# Patient Record
Sex: Female | Born: 1940
Health system: Southern US, Community
[De-identification: ages and names within clinical notes are randomized; demographics above are authoritative.]

## PROBLEM LIST (undated history)

## (undated) DIAGNOSIS — Z9889 Other specified postprocedural states: Secondary | ICD-10-CM

## (undated) DIAGNOSIS — E7800 Pure hypercholesterolemia, unspecified: Secondary | ICD-10-CM

## (undated) DIAGNOSIS — C801 Malignant (primary) neoplasm, unspecified: Secondary | ICD-10-CM

## (undated) DIAGNOSIS — I10 Essential (primary) hypertension: Secondary | ICD-10-CM

## (undated) DIAGNOSIS — R7303 Prediabetes: Secondary | ICD-10-CM

## (undated) DIAGNOSIS — K589 Irritable bowel syndrome without diarrhea: Secondary | ICD-10-CM

## (undated) DIAGNOSIS — R112 Nausea with vomiting, unspecified: Secondary | ICD-10-CM

## (undated) DIAGNOSIS — K219 Gastro-esophageal reflux disease without esophagitis: Secondary | ICD-10-CM

## (undated) DIAGNOSIS — R011 Cardiac murmur, unspecified: Secondary | ICD-10-CM

## (undated) HISTORY — PX: BREAST SURGERY: SHX581

## (undated) HISTORY — PX: TOTAL ABDOMINAL HYSTERECTOMY: SHX209

## (undated) HISTORY — DX: Irritable bowel syndrome, unspecified: K58.9

## (undated) HISTORY — PX: CHOLECYSTECTOMY: SHX55

---
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
## 1999-02-02 ENCOUNTER — Other Ambulatory Visit: Admission: RE | Admit: 1999-02-02 | Discharge: 1999-02-02 | Payer: Self-pay | Admitting: Obstetrics and Gynecology

## 2000-02-06 ENCOUNTER — Other Ambulatory Visit: Admission: RE | Admit: 2000-02-06 | Discharge: 2000-02-06 | Payer: Self-pay | Admitting: Obstetrics and Gynecology

## 2001-02-11 ENCOUNTER — Other Ambulatory Visit: Admission: RE | Admit: 2001-02-11 | Discharge: 2001-02-11 | Payer: Self-pay | Admitting: Obstetrics and Gynecology

## 2005-08-25 ENCOUNTER — Encounter: Admission: RE | Admit: 2005-08-25 | Discharge: 2005-08-25 | Payer: Self-pay | Admitting: General Surgery

## 2005-08-25 ENCOUNTER — Encounter (INDEPENDENT_AMBULATORY_CARE_PROVIDER_SITE_OTHER): Payer: Self-pay | Admitting: *Deleted

## 2006-03-05 ENCOUNTER — Encounter: Admission: RE | Admit: 2006-03-05 | Discharge: 2006-03-05 | Payer: Self-pay | Admitting: Internal Medicine

## 2006-09-04 ENCOUNTER — Encounter: Admission: RE | Admit: 2006-09-04 | Discharge: 2006-09-04 | Payer: Self-pay | Admitting: Internal Medicine

## 2007-04-15 ENCOUNTER — Encounter: Admission: RE | Admit: 2007-04-15 | Discharge: 2007-04-15 | Payer: Self-pay | Admitting: Internal Medicine

## 2009-09-13 ENCOUNTER — Encounter: Admission: RE | Admit: 2009-09-13 | Discharge: 2009-09-13 | Payer: Self-pay | Admitting: Internal Medicine

## 2009-12-03 ENCOUNTER — Encounter: Admission: RE | Admit: 2009-12-03 | Discharge: 2009-12-03 | Payer: Self-pay | Admitting: Internal Medicine

## 2010-09-14 ENCOUNTER — Encounter: Admission: RE | Admit: 2010-09-14 | Discharge: 2010-09-14 | Payer: Self-pay | Admitting: Internal Medicine

## 2011-01-15 ENCOUNTER — Encounter: Payer: Self-pay | Admitting: Internal Medicine

## 2011-08-01 ENCOUNTER — Other Ambulatory Visit: Payer: Self-pay | Admitting: Internal Medicine

## 2011-08-01 DIAGNOSIS — Z1231 Encounter for screening mammogram for malignant neoplasm of breast: Secondary | ICD-10-CM

## 2011-09-18 ENCOUNTER — Ambulatory Visit
Admission: RE | Admit: 2011-09-18 | Discharge: 2011-09-18 | Disposition: A | Discharge status: Home / Self Care | Payer: Medicare Other | Source: Ambulatory Visit | Attending: Internal Medicine | Admitting: Internal Medicine

## 2011-09-18 DIAGNOSIS — Z1231 Encounter for screening mammogram for malignant neoplasm of breast: Secondary | ICD-10-CM

## 2012-02-07 ENCOUNTER — Other Ambulatory Visit: Payer: Self-pay | Admitting: Gastroenterology

## 2012-08-09 ENCOUNTER — Other Ambulatory Visit: Payer: Self-pay | Admitting: Internal Medicine

## 2012-08-09 DIAGNOSIS — Z1231 Encounter for screening mammogram for malignant neoplasm of breast: Secondary | ICD-10-CM

## 2012-08-17 ENCOUNTER — Observation Stay (HOSPITAL_COMMUNITY)
Admission: EM | Admit: 2012-08-17 | Discharge: 2012-08-18 | Disposition: A | Discharge status: Home / Self Care | Payer: Medicare Other | Attending: Emergency Medicine | Admitting: Emergency Medicine

## 2012-08-17 ENCOUNTER — Encounter (HOSPITAL_COMMUNITY): Payer: Self-pay | Admitting: *Deleted

## 2012-08-17 DIAGNOSIS — G454 Transient global amnesia: Principal | ICD-10-CM | POA: Insufficient documentation

## 2012-08-17 DIAGNOSIS — R413 Other amnesia: Secondary | ICD-10-CM | POA: Insufficient documentation

## 2012-08-17 DIAGNOSIS — F29 Unspecified psychosis not due to a substance or known physiological condition: Secondary | ICD-10-CM | POA: Insufficient documentation

## 2012-08-17 DIAGNOSIS — I10 Essential (primary) hypertension: Secondary | ICD-10-CM | POA: Insufficient documentation

## 2012-08-17 HISTORY — DX: Essential (primary) hypertension: I10

## 2012-08-17 HISTORY — DX: Pure hypercholesterolemia, unspecified: E78.00

## 2012-08-17 LAB — CBC WITH DIFFERENTIAL/PLATELET
Eosinophils Absolute: 0.1 10*3/uL (ref 0.0–0.7)
Hemoglobin: 13.7 g/dL (ref 12.0–15.0)
MCH: 30.1 pg (ref 26.0–34.0)
MCHC: 34.1 g/dL (ref 30.0–36.0)
MCV: 88.4 fL (ref 78.0–100.0)
Monocytes Absolute: 0.4 10*3/uL (ref 0.1–1.0)
Monocytes Relative: 7 % (ref 3–12)
RBC: 4.55 MIL/uL (ref 3.87–5.11)
WBC: 5.8 10*3/uL (ref 4.0–10.5)

## 2012-08-17 LAB — POCT I-STAT TROPONIN I

## 2012-08-17 LAB — POCT I-STAT, CHEM 8
Calcium, Ion: 1.27 mmol/L (ref 1.13–1.30)
Creatinine, Ser: 0.7 mg/dL (ref 0.50–1.10)
HCT: 41 % (ref 36.0–46.0)
Hemoglobin: 13.9 g/dL (ref 12.0–15.0)
Potassium: 4.3 mEq/L (ref 3.5–5.1)

## 2012-08-17 NOTE — ED Notes (Signed)
Pt husband states about an hour and a half ago she had a moment where she seemed to forget everything. Pt also had a increase in blood pressure at home. Pt husband states that pt seems back to normal now and pt alert and oriented x 3. Pt states that she does not remember the incident. Pt able to move all extremities and follow commands.

## 2012-08-18 ENCOUNTER — Emergency Department (HOSPITAL_COMMUNITY): Payer: Medicare Other

## 2012-08-18 ENCOUNTER — Observation Stay (HOSPITAL_COMMUNITY): Payer: Medicare Other

## 2012-08-18 DIAGNOSIS — I517 Cardiomegaly: Secondary | ICD-10-CM

## 2012-08-18 DIAGNOSIS — G459 Transient cerebral ischemic attack, unspecified: Secondary | ICD-10-CM

## 2012-08-18 LAB — COMPREHENSIVE METABOLIC PANEL
ALT: 29 U/L (ref 0–35)
Alkaline Phosphatase: 111 U/L (ref 39–117)
CO2: 26 mEq/L (ref 19–32)
Chloride: 103 mEq/L (ref 96–112)
GFR calc Af Amer: >90 mL/min (ref >90)
GFR calc non Af Amer: >90 mL/min (ref >90)
Glucose, Bld: 129 mg/dL — ABNORMAL HIGH (ref 70–99)
Potassium: 4.1 mEq/L (ref 3.5–5.1)
Sodium: 139 mEq/L (ref 135–145)
Total Bilirubin: 0.3 mg/dL (ref 0.3–1.2)

## 2012-08-18 LAB — LIPID PANEL
HDL: 56 mg/dL (ref >39)
Total CHOL/HDL Ratio: 2.9 RATIO
VLDL: 20 mg/dL (ref 0–40)

## 2012-08-18 LAB — URINALYSIS, ROUTINE W REFLEX MICROSCOPIC
Bilirubin Urine: NEGATIVE
Nitrite: NEGATIVE
Specific Gravity, Urine: 1.01 (ref 1.005–1.030)
Urobilinogen, UA: 0.2 mg/dL (ref 0.0–1.0)
pH: 7 (ref 5.0–8.0)

## 2012-08-18 LAB — HEMOGLOBIN A1C
Hgb A1c MFr Bld: 6 % — ABNORMAL HIGH (ref <5.7)
Mean Plasma Glucose: 126 mg/dL — ABNORMAL HIGH (ref <117)

## 2012-08-18 LAB — URINE MICROSCOPIC-ADD ON

## 2012-08-18 MED ORDER — ASPIRIN 325 MG PO TABS
325.0000 mg | ORAL_TABLET | Freq: Every day | ORAL | Status: DC
  Administered 2012-08-18: 325 mg via ORAL
  Filled 2012-08-18 (×2): qty 1

## 2012-08-18 MED ORDER — SODIUM CHLORIDE 0.9 % IV SOLN
INTRAVENOUS | Status: DC
  Administered 2012-08-18: 02:00:00 via INTRAVENOUS

## 2012-08-18 MED ORDER — ACETAMINOPHEN 325 MG PO TABS
650.0000 mg | ORAL_TABLET | ORAL | Status: DC | PRN
  Administered 2012-08-18 (×2): 650 mg via ORAL
  Filled 2012-08-18 (×2): qty 2

## 2012-08-18 MED ORDER — ONDANSETRON HCL 4 MG/2ML IJ SOLN
4.0000 mg | INTRAMUSCULAR | Status: DC | PRN
  Administered 2012-08-18: 4 mg via INTRAVENOUS
  Filled 2012-08-18: qty 2

## 2012-08-18 MED ORDER — AMLODIPINE BESYLATE 2.5 MG PO TABS
2.5000 mg | ORAL_TABLET | Freq: Every day | ORAL | Status: DC
  Administered 2012-08-18: 2.5 mg via ORAL
  Filled 2012-08-18: qty 1

## 2012-08-18 NOTE — ED Provider Notes (Signed)
History     CSN: [id number]  Arrival date & time 08/17/12  2109   First MD Initiated Contact with Patient 08/17/12 2312      Chief Complaint  Patient presents with  . Hypertension    HPI Pt was seen at 2315.  Per pt and her husband, c/o sudden onset and resolution of one episode of confusion and AMS that began approx 1900 PTA.  Pt's husband describes the episode of "asking questions about things she already knew about" that lasted approx before resolving.  He states that he took pt's BP and it was "200's/100's."  Pt does not recall events PTA, stating she "just remembers watching [employer]."  Denies syncope, no dysarthria, no facial droop, no focal motor weakness, no tingling/numbness in extremities, no ataxia, no N/V/D, no CP/SOB, no abd pain, no fevers.    Past Medical History  Diagnosis Date  . Hypertension   . High cholesterol     History reviewed. No pertinent past surgical history.   History  Substance Use Topics  . Smoking status: Never Smoker   . Smokeless tobacco: Not on file  . Alcohol Use: No    Review of Systems ROS: Statement: All systems negative except as marked or noted in the HPI; Constitutional: Negative for fever and chills. ; ; Eyes: Negative for eye pain, redness and discharge. ; ; ENMT: Negative for ear pain, hoarseness, nasal congestion, sinus pressure and sore throat. ; ; Cardiovascular: Negative for chest pain, palpitations, diaphoresis, dyspnea and peripheral edema. ; ; Respiratory: Negative for cough, wheezing and stridor. ; ; Gastrointestinal: Negative for nausea, vomiting, diarrhea, abdominal pain, blood in stool, hematemesis, jaundice and rectal bleeding. . ; ; Genitourinary: Negative for dysuria, flank pain and hematuria. ; ; Musculoskeletal: Negative for back pain and neck pain. Negative for swelling and trauma.; ; Skin: Negative for pruritus, rash, abrasions, blisters, bruising and skin lesion.; ; Neuro: +AMS. Negative for headache,  lightheadedness and neck stiffness. Negative for weakness, altered level of consciousness , extremity weakness, paresthesias, involuntary movement, seizure and syncope.     Allergies  Codeine  Home Medications   Current Outpatient Rx  Name Route Sig Dispense Refill  . ASPIRIN EC 81 MG PO TBEC Oral Take 81 mg by mouth daily.    Marland Kitchen VITAMIN D PO Oral Take 1 tablet by mouth daily. 500iu    . CLONAZEPAM 0.5 MG PO TABS Oral Take 0.25-0.5 mg by mouth at bedtime as needed. sleep    . IBUPROFEN 200 MG PO TABS Oral Take 200-600 mg by mouth every 6 (six) hours as needed. Headache or restless leg pain    . PRAVASTATIN SODIUM 40 MG PO TABS Oral Take 40 mg by mouth at bedtime.      BP 185/64  Pulse 73  Temp 98.3 F (36.8 C) (Oral)  Resp 16  SpO2 96%  Physical Exam 2315: Physical examination:  Nursing notes reviewed; Vital signs and O2 SAT reviewed;  Constitutional: Well developed, Well nourished, Well hydrated, In no acute distress; Head:  Normocephalic, atraumatic; Eyes: EOMI, PERRL, No scleral icterus; ENMT: Mouth and pharynx normal, Mucous membranes moist; Neck: Supple, Full range of motion, No lymphadenopathy; Cardiovascular: Regular rate and rhythm, No gallop; Respiratory: Breath sounds clear & equal bilaterally, No wheezes.  Speaking full sentences with ease, Normal respiratory effort/excursion; Chest: Nontender, Movement normal; Abdomen: Soft, Nontender, Nondistended, Normal bowel sounds;; Extremities: Pulses normal, No tenderness, No edema, No calf edema or asymmetry.; Neuro: AA&Ox3, Major CN grossly intact.  Strength 5/5 equal bilat UE's and LE's.  DTR 2/4 equal bilat UE's and LE's.  No gross sensory deficits.  Normal cerebellar testing bilat UE's (finger-nose) and LE's (heel-shin).  Speech clear.  No facial droop.  No nystagmus.;.; Skin: Color normal, Warm, Dry.   ED Course  Procedures   MDM  MDM Reviewed: nursing note, vitals and previous chart Interpretation: ECG, labs, x-ray and CT  scan    Date: 08/18/2012  Rate: 67  Rhythm: normal sinus rhythm  QRS Axis: normal  Intervals: normal  ST/T Wave abnormalities: normal  Conduction Disutrbances:nonspecific intraventricular conduction delay  Narrative Interpretation:   Old EKG Reviewed: none available.   Results for orders placed during the hospital encounter of 08/17/12  CBC WITH DIFFERENTIAL      Component Value Range   WBC 5.8  4.0 - 10.5 K/uL   RBC 4.55  3.87 - 5.11 MIL/uL   Hemoglobin 13.7  12.0 - 15.0 g/dL   HCT 56.2  13.0 - 86.5 %   MCV 88.4  78.0 - 100.0 fL   MCH 30.1  26.0 - 34.0 pg   MCHC 34.1  30.0 - 36.0 g/dL   RDW 78.4  69.6 - 29.5 %   Platelets 201  150 - 400 K/uL   Neutrophils Relative 61  43 - 77 %   Neutro Abs 3.5  1.7 - 7.7 K/uL   Lymphocytes Relative 31  12 - 46 %   Lymphs Abs 1.8  0.7 - 4.0 K/uL   Monocytes Relative 7  3 - 12 %   Monocytes Absolute 0.4  0.1 - 1.0 K/uL   Eosinophils Relative 1  0 - 5 %   Eosinophils Absolute 0.1  0.0 - 0.7 K/uL   Basophils Relative 1  0 - 1 %   Basophils Absolute 0.0  0.0 - 0.1 K/uL  POCT I-STAT, CHEM 8      Component Value Range   Sodium 141  135 - 145 mEq/L   Potassium 4.3  3.5 - 5.1 mEq/L   Chloride 104  96 - 112 mEq/L   BUN 12  6 - 23 mg/dL   Creatinine, Ser 2.84  0.50 - 1.10 mg/dL   Glucose, Bld 132 (*) 70 - 99 mg/dL   Calcium, Ion 4.40  1.02 - 1.30 mmol/L   TCO2 26  0 - 100 mmol/L   Hemoglobin 13.9  12.0 - 15.0 g/dL   HCT 72.5  36.6 - 44.0 %  POCT I-STAT TROPONIN I      Component Value Range   Troponin i, poc 0.00  0.00 - 0.08 ng/mL   Comment 3           URINALYSIS, ROUTINE W REFLEX MICROSCOPIC      Component Value Range   Color, Urine YELLOW  YELLOW   APPearance CLEAR  CLEAR   Specific Gravity, Urine 1.010  1.005 - 1.030   pH 7.0  5.0 - 8.0   Glucose, UA NEGATIVE  NEGATIVE mg/dL   Hgb urine dipstick NEGATIVE  NEGATIVE   Bilirubin Urine NEGATIVE  NEGATIVE   Ketones, ur NEGATIVE  NEGATIVE mg/dL   Protein, ur NEGATIVE  NEGATIVE mg/dL     Urobilinogen, UA 0.2  0.0 - 1.0 mg/dL   Nitrite NEGATIVE  NEGATIVE   Leukocytes, UA MODERATE (*) NEGATIVE  URINE MICROSCOPIC-ADD ON      Component Value Range   Squamous Epithelial / LPF FEW (*) RARE   WBC, UA 3-6  <3 WBC/hpf  RBC / HPF 0-2  <3 RBC/hpf   Bacteria, UA RARE  RARE   Dg Chest 2 View 08/18/2012  *RADIOLOGY REPORT*  Clinical Data: 71 year old female with episode of confusion. Tachycardia, palpitations.  CHEST - 2 VIEW  Comparison: None.  Findings: Cardiac size at the upper limits of normal. Other mediastinal contours are within normal limits.  Visualized tracheal air column is within normal limits.  Mild increased interstitial markings diffusely.  No pneumothorax, pulmonary edema, pleural effusion or confluent pulmonary opacity. No acute osseous abnormality identified.  IMPRESSION: No acute cardiopulmonary abnormality.   Original Report Authenticated By: Harley Hallmark, M.D.    Ct Head Wo Contrast 08/18/2012  *RADIOLOGY REPORT*  Clinical Data: 71 year old female with confusion and altered mental status.  CT HEAD WITHOUT CONTRAST  Technique:  Contiguous axial images were obtained from the base of the skull through the vertex without contrast.  Comparison: None.  Findings: Visualized paranasal sinuses and mastoids are clear.  No acute osseous abnormality identified.  Visualized orbits and scalp soft tissues are within normal limits.  Cerebral volume is within normal limits for age.  No midline shift, ventriculomegaly, mass effect, evidence of mass lesion, intracranial hemorrhage or evidence of cortically based acute infarction.  Gray-white matter differentiation is within normal limits throughout the brain.  No suspicious intracranial vascular hyperdensity.  IMPRESSION: Normal for age noncontrast CT appearance of the brain.   Original Report Authenticated By: Harley Hallmark, M.D.       Mikkel.Chamorro:  Pt continues per her baseline.  Neuro exam unchanged.  Pt has ambulated with steady gait, easy  resps.  T/C to Dr. Timothy Lasso (on call for Dr. Wylene Simmer), case discussed, including:  HPI, pertinent PM/SHx, VS/PE, dx testing, ED course and treatment:  requests to place pt in CDU TIA Observation protocol, he will see pt in the morning.  Dx testing d/w pt and family.  Questions answered.  Verb understanding, agreeable to CDU Obs protocol.         Laray Anger, DO 08/18/12 1916

## 2012-08-18 NOTE — ED Notes (Signed)
Pt is in MRI  

## 2012-08-18 NOTE — Progress Notes (Signed)
Subjective: 71 F presented last night with 30 minutes of confusion/Amnesia - ? TIA Pt husband states about an hour and a half prior to presentation she had a 30 min event where she seemed to forget everything and was confused. Pt also had a increase in blood pressure.  She is  back to normal now and pt alert and oriented x 3. Pt states that she does not remember the incident. Pt able to move all extremities and follow commands. She was put on CDU TIA protocol and all studies thus far are (-).   Objective: Vital signs in last 24 hours: Temp:  [98.2 F (36.8 C)-98.4 F (36.9 C)] 98.2 F (36.8 C) (08/25 0141) Pulse Rate:  [64-73] 72  (08/25 0620) Resp:  [14-18] 14  (08/25 0620) BP: (123-187)/(48-92) 130/52 mmHg (08/25 0620) SpO2:  [96 %-98 %] 98 % (08/25 0620) Weight change:     CBG (last 3)  No results found for this basename: GLUCAP:3 in the last 72 hours  Intake/Output from previous day: No intake or output data in the 24 hours ending 08/18/12 0918     Physical Exam  General appearance: Alert and oriented.  No distress Eyes: no scleral icterus Throat: oropharynx moist without erythema Resp: CTA Cardio: Reg GI: soft, non-tender; bowel sounds normal; no masses,  no organomegaly Extremities: no clubbing, cyanosis or edema No Neuro issues. Memory back to nml.  No Weakness, numbness or issues.   Lab Results:  [facility] 08/18/12 0122 08/17/12 2138  NA 139 141  K 4.1 4.3  CL 103 104  CO2 26 --  GLUCOSE 129* 147*  BUN 9 12  CREATININE 0.55 0.70  CALCIUM 10.2 --  MG -- --  PHOS -- --     Basename 08/18/12 0122  AST 24  ALT 29  ALKPHOS 111  BILITOT 0.3  PROT 7.6  ALBUMIN 4.3     Basename 08/17/12 2138 08/17/12 2123  WBC -- 5.8  NEUTROABS -- 3.5  HGB 13.9 13.7  HCT 41.0 40.2  MCV -- 88.4  PLT -- 201    Lab Results  Component Value Date   INR 0.95 08/18/2012    No results found for this basename: CKTOTAL:3,CKMB:3,CKMBINDEX:3,TROPONINI:3 in the last 72  hours  No results found for this basename: TSH,T4TOTAL,FREET3,T3FREE,THYROIDAB in the last 72 hours  No results found for this basename: VITAMINB12:2,FOLATE:2,FERRITIN:2,TIBC:2,IRON:2,RETICCTPCT:2 in the last 72 hours  Micro Results: No results found for this or any previous visit (from the past 240 hour(s)).   Studies/Results: Dg Chest 2 View  08/18/2012  *RADIOLOGY REPORT*  Clinical Data: 71 year old female with episode of confusion. Tachycardia, palpitations.  CHEST - 2 VIEW  Comparison: None.  Findings: Cardiac size at the upper limits of normal. Other mediastinal contours are within normal limits.  Visualized tracheal air column is within normal limits.  Mild increased interstitial markings diffusely.  No pneumothorax, pulmonary edema, pleural effusion or confluent pulmonary opacity. No acute osseous abnormality identified.  IMPRESSION: No acute cardiopulmonary abnormality.   Original Report Authenticated By: Harley Hallmark, M.D.    Ct Head Wo Contrast  08/18/2012  *RADIOLOGY REPORT*  Clinical Data: 71 year old female with confusion and altered mental status.  CT HEAD WITHOUT CONTRAST  Technique:  Contiguous axial images were obtained from the base of the skull through the vertex without contrast.  Comparison: None.  Findings: Visualized paranasal sinuses and mastoids are clear.  No acute osseous abnormality identified.  Visualized orbits and scalp soft tissues are within normal limits.  Cerebral volume is within normal limits for age.  No midline shift, ventriculomegaly, mass effect, evidence of mass lesion, intracranial hemorrhage or evidence of cortically based acute infarction.  Gray-white matter differentiation is within normal limits throughout the brain.  No suspicious intracranial vascular hyperdensity.  IMPRESSION: Normal for age noncontrast CT appearance of the brain.   Original Report Authenticated By: Harley Hallmark, M.D.    Mr Brain Wo Contrast  08/18/2012  *RADIOLOGY REPORT*   Clinical Data:  Confusion.  Lapse of memory.  Suspected acute but ill-defined cerebrovascular disease.  MRI HEAD WITHOUT CONTRAST MRA HEAD WITHOUT CONTRAST  Technique:  Multiplanar, multiecho pulse sequences of the brain and surrounding structures were obtained without intravenous contrast. Angiographic images of the head were obtained using MRA technique without contrast.  Comparison:  CT head earlier in the day.  MRI HEAD  Findings:  There is no evidence for acute infarction, intracranial hemorrhage, mass lesion, hydrocephalus, or extra-axial fluid.  Mild age related atrophy.  Slight chronic microvascular ischemic change in the periventricular and subcortical white matter.  No foci of chronic hemorrhage.  No midline shift.  Major intracranial vascular structures patent.  Normal pituitary and cerebellar tonsils. Calvarium and skull base intact.  Mild chronic sinus disease.  No acute mastoid fluid. No change from prior recent CT.  IMPRESSION: No acute or focal intracranial abnormality.  Mild age related changes.  MRA HEAD  Findings: The internal carotid arteries are widely patent.  The basilar artery is widely patent with right vertebral dominant. There is no intracranial stenosis or aneurysm.  IMPRESSION: Negative exam.   Original Report Authenticated By: Elsie Stain, M.D.    Mr Mra Head/brain Wo Cm  08/18/2012  *RADIOLOGY REPORT*  Clinical Data:  Confusion.  Lapse of memory.  Suspected acute but ill-defined cerebrovascular disease.  MRI HEAD WITHOUT CONTRAST MRA HEAD WITHOUT CONTRAST  Technique:  Multiplanar, multiecho pulse sequences of the brain and surrounding structures were obtained without intravenous contrast. Angiographic images of the head were obtained using MRA technique without contrast.  Comparison:  CT head earlier in the day.  MRI HEAD  Findings:  There is no evidence for acute infarction, intracranial hemorrhage, mass lesion, hydrocephalus, or extra-axial fluid.  Mild age related atrophy.   Slight chronic microvascular ischemic change in the periventricular and subcortical white matter.  No foci of chronic hemorrhage.  No midline shift.  Major intracranial vascular structures patent.  Normal pituitary and cerebellar tonsils. Calvarium and skull base intact.  Mild chronic sinus disease.  No acute mastoid fluid. No change from prior recent CT.  IMPRESSION: No acute or focal intracranial abnormality.  Mild age related changes.  MRA HEAD  Findings: The internal carotid arteries are widely patent.  The basilar artery is widely patent with right vertebral dominant. There is no intracranial stenosis or aneurysm.  IMPRESSION: Negative exam.   Original Report Authenticated By: Elsie Stain, M.D.      Medications: Scheduled:   . aspirin  325 mg Oral Daily   Continuous:   . sodium chloride 75 mL/hr at 08/18/12 0140     Assessment/Plan: Active Problems:  * No active hospital problems. *   TIA? -  With Transient Global Amnesia type Sxs.  CCT (-), MRI/A (-).  No CVA Sxs at this time.  If ECHO (-) she can go home.  OK to eat Breakfast and ordered. HTN - with very high BPs last night - Better and now worse.  Start Amlodipine 2.5 per day -  first now - I wrote Rx and left it with her.  Follow up in our office this week. Lipids. Hyperglycemia - eval for DM2 as outpt. D/C on ASA daily.    LOS: 1 day   Emalea Mix M 08/18/2012, 9:18 AM

## 2012-08-18 NOTE — Progress Notes (Signed)
VASCULAR LAB PRELIMINARY  PRELIMINARY  PRELIMINARY  PRELIMINARY  Carotid Dopplers completed.    Preliminary report:  No ICA stenosis.  Vertebral artery flow is antegrade.  Dann Galicia, 08/18/2012, 8:47 AM

## 2012-08-18 NOTE — ED Provider Notes (Signed)
Seen by PCP in ED, echo results pnd but Pt/family comfortable with discharge, Pt asymptomatic, still amnestic for event.  Hurman Horn, MD 08/22/12 [phone]

## 2012-08-18 NOTE — ED Notes (Signed)
KIM STEVENS ( DAUGHTER)  - PHONE NO. [phone].

## 2012-08-18 NOTE — Progress Notes (Signed)
  Echocardiogram 2D Echocardiogram has been performed.  Laura [name] FRANCES 08/18/2012, 9:20 AM

## 2012-08-18 NOTE — ED Notes (Signed)
Patient returned from mri, on stretcher, ambulated to restroom and placed back on cardiac monitor.

## 2012-08-22 LAB — URINE CULTURE

## 2012-08-23 NOTE — ED Notes (Signed)
+   Urine Chart sent to EDP office for review. 

## 2012-09-18 ENCOUNTER — Ambulatory Visit
Admission: RE | Admit: 2012-09-18 | Discharge: 2012-09-18 | Disposition: A | Discharge status: Home / Self Care | Payer: Medicare Other | Source: Ambulatory Visit | Attending: Internal Medicine | Admitting: Internal Medicine

## 2012-09-18 DIAGNOSIS — Z1231 Encounter for screening mammogram for malignant neoplasm of breast: Secondary | ICD-10-CM

## 2013-07-28 ENCOUNTER — Other Ambulatory Visit: Payer: Self-pay

## 2013-07-28 DIAGNOSIS — Z1231 Encounter for screening mammogram for malignant neoplasm of breast: Secondary | ICD-10-CM

## 2013-09-22 ENCOUNTER — Ambulatory Visit
Admission: RE | Admit: 2013-09-22 | Discharge: 2013-09-22 | Disposition: A | Discharge status: Home / Self Care | Payer: Medicare Other | Source: Ambulatory Visit

## 2013-09-22 DIAGNOSIS — Z1231 Encounter for screening mammogram for malignant neoplasm of breast: Secondary | ICD-10-CM

## 2014-07-15 ENCOUNTER — Other Ambulatory Visit: Payer: Self-pay

## 2014-07-15 DIAGNOSIS — Z1231 Encounter for screening mammogram for malignant neoplasm of breast: Secondary | ICD-10-CM

## 2014-07-27 ENCOUNTER — Encounter: Payer: Self-pay | Admitting: Cardiology

## 2014-07-27 ENCOUNTER — Ambulatory Visit (INDEPENDENT_AMBULATORY_CARE_PROVIDER_SITE_OTHER): Payer: Medicare Other | Admitting: Cardiology

## 2014-07-27 VITALS — BP 158/70 | HR 73 | Ht 65.5 in | Wt 160.7 lb

## 2014-07-27 DIAGNOSIS — R5381 Other malaise: Secondary | ICD-10-CM

## 2014-07-27 DIAGNOSIS — R002 Palpitations: Secondary | ICD-10-CM

## 2014-07-27 DIAGNOSIS — R5383 Other fatigue: Principal | ICD-10-CM

## 2014-07-27 LAB — BASIC METABOLIC PANEL
BUN: 15 mg/dL (ref 6–23)
CALCIUM: 9.8 mg/dL (ref 8.4–10.5)
CO2: 28 mEq/L (ref 19–32)
Chloride: 104 mEq/L (ref 96–112)
Creat: 0.72 mg/dL (ref 0.50–1.10)
Glucose, Bld: 110 mg/dL — ABNORMAL HIGH (ref 70–99)
POTASSIUM: 4 meq/L (ref 3.5–5.3)
SODIUM: 142 meq/L (ref 135–145)

## 2014-07-27 LAB — MAGNESIUM: MAGNESIUM: 2.1 mg/dL (ref 1.5–2.5)

## 2014-07-27 NOTE — Progress Notes (Signed)
HPI The patient has no prior cardiac history. I did note an echocardiogram in 2013 for evaluation of neurologic complaints. This was essentially unremarkable and I reviewed these results. She is here for evaluation of palpitations. These been going on for some time. However, she had an episode that woke her from her sleep which is why she was referred. She thinks it was going fast. She can't describe it as irregular. She says it may last for a few minutes. It goes away spontaneously. She hasn't had any presyncope or syncope with it. She thinks it is happening a little more frequently when she takes a nap or at night when she sleeps. It does not happen during the day. She denies any chest pressure, neck or arm discomfort. She denies any shortness of breath, PND or orthopnea. She's had no weight gain or edema. She does drink a lot of iced tea.  Allergies  Allergen Reactions  . Pneumococcal Vaccines Swelling and Other (See Comments)    Fever  . Codeine Other (See Comments)    Looses balance and feels light headed    Current Outpatient Prescriptions  Medication Sig Dispense Refill  . amLODipine (NORVASC) 5 MG tablet Take 1 tablet by mouth daily.      Marland Kitchen aspirin EC 81 MG tablet Take 81 mg by mouth daily.      . Cholecalciferol (VITAMIN D PO) Take 1 tablet by mouth daily. 500iu      . clonazePAM (KLONOPIN) 0.5 MG tablet Take 0.25-0.5 mg by mouth 2 (two) times daily as needed. sleep      . doxazosin (CARDURA) 1 MG tablet Take 1 tablet by mouth at bedtime.      . fexofenadine (ALLEGRA ALLERGY) 180 MG tablet Take 180 mg by mouth daily.      Marland Kitchen ibuprofen (ADVIL,MOTRIN) 200 MG tablet Take 200-600 mg by mouth every 6 (six) hours as needed. Headache or restless leg pain      . pravastatin (PRAVACHOL) 40 MG tablet Take 40 mg by mouth at bedtime.       No current facility-administered medications for this visit.    Past Medical History  Diagnosis Date  . Hypertension   . High cholesterol     No  past surgical history on file.  No family history on file.  History   Social History  . Marital Status: Married    Spouse Name: N/A    Number of Children: N/A  . Years of Education: N/A   Occupational History  . Not on file.   Social History Main Topics  . Smoking status: Never Smoker   . Smokeless tobacco: Not on file  . Alcohol Use: No  . Drug Use: No  . Sexual Activity:    Other Topics Concern  . Not on file   Social History Narrative  . No narrative on file    ROS:  As stated in the HPI and negative for all other systems.   PHYSICAL EXAM BP 158/70  Pulse 73  Ht 5' 5.5" (1.664 m)  Wt 160 lb 11.2 oz (72.893 kg)  BMI 26.33 kg/m2 GENERAL:  Well appearing HEENT:  Pupils equal round and reactive, fundi not visualized, oral mucosa unremarkable NECK:  No jugular venous distention, waveform within normal limits, carotid upstroke brisk and symmetric, no bruits, no thyromegaly LYMPHATICS:  No cervical, inguinal adenopathy LUNGS:  Clear to auscultation bilaterally BACK:  No CVA tenderness CHEST:  Unremarkable HEART:  PMI not displaced or sustained,S1 and S2  within normal limits, no S3, no S4, no clicks, no rubs, soft apical and upper sternal border 3 systolic murmur, no diastolic murmurs ABD:  Flat, positive bowel sounds normal in frequency in pitch, no bruits, no rebound, no guarding, no midline pulsatile mass, no hepatomegaly, no splenomegaly EXT:  2 plus pulses throughout, no edema, no cyanosis no clubbing SKIN:  No rashes no nodules NEURO:  Cranial nerves II through XII grossly intact, motor grossly intact throughout [place]:  Cognitively intact, oriented to person place and time   EKG:  Sinus rhythm, rate this 73, incomplete right bundle branch block, no acute ST-T wave changes  07/27/2014  ASSESSMENT AND PLAN  PALPITATIONS:  I will place a 48 hour Holter monitor. I don't strongly suspect fibrillation but might want to look for this given his previous neurologic  complaints. She had an episode of global amnesia 2 years ago.  HTN:  For blood pressure is controlled. In the future if she has palpitations that she wants treated symptomatically we might switch her from Norvasc to Cardizem.

## 2014-07-27 NOTE — Patient Instructions (Signed)
Your physician recommends that you schedule a follow-up appointment in: as needed basis  We are ordering a 48 hr monitor  We are also ordering bloodwork

## 2014-07-28 LAB — TSH: TSH: 1.344 u[IU]/mL (ref 0.350–4.500)

## 2014-07-28 NOTE — Progress Notes (Signed)
Pt called with her lab results and a copy was faxed to Dr. Osborne Casco

## 2014-09-24 ENCOUNTER — Ambulatory Visit
Admission: RE | Admit: 2014-09-24 | Discharge: 2014-09-24 | Disposition: A | Discharge status: Home / Self Care | Payer: Medicare Other | Source: Ambulatory Visit

## 2014-09-24 DIAGNOSIS — Z1231 Encounter for screening mammogram for malignant neoplasm of breast: Secondary | ICD-10-CM

## 2015-05-27 ENCOUNTER — Other Ambulatory Visit: Payer: Self-pay

## 2015-05-27 DIAGNOSIS — Z803 Family history of malignant neoplasm of breast: Secondary | ICD-10-CM

## 2015-05-27 DIAGNOSIS — Z1231 Encounter for screening mammogram for malignant neoplasm of breast: Secondary | ICD-10-CM

## 2015-09-27 ENCOUNTER — Ambulatory Visit
Admission: RE | Admit: 2015-09-27 | Discharge: 2015-09-27 | Disposition: A | Discharge status: Home / Self Care | Payer: Medicare Other | Source: Ambulatory Visit

## 2015-09-27 DIAGNOSIS — Z803 Family history of malignant neoplasm of breast: Secondary | ICD-10-CM

## 2015-09-27 DIAGNOSIS — Z1231 Encounter for screening mammogram for malignant neoplasm of breast: Secondary | ICD-10-CM

## 2016-01-18 DIAGNOSIS — R8290 Unspecified abnormal findings in urine: Secondary | ICD-10-CM | POA: Diagnosis not present

## 2016-01-18 DIAGNOSIS — R7302 Impaired glucose tolerance (oral): Secondary | ICD-10-CM | POA: Diagnosis not present

## 2016-01-18 DIAGNOSIS — I10 Essential (primary) hypertension: Secondary | ICD-10-CM | POA: Diagnosis not present

## 2016-01-18 DIAGNOSIS — N390 Urinary tract infection, site not specified: Secondary | ICD-10-CM | POA: Diagnosis not present

## 2016-01-25 DIAGNOSIS — Z6826 Body mass index (BMI) 26.0-26.9, adult: Secondary | ICD-10-CM | POA: Diagnosis not present

## 2016-01-25 DIAGNOSIS — Z1212 Encounter for screening for malignant neoplasm of rectum: Secondary | ICD-10-CM | POA: Diagnosis not present

## 2016-01-25 DIAGNOSIS — Z0000 Encounter for general adult medical examination without abnormal findings: Secondary | ICD-10-CM | POA: Diagnosis not present

## 2016-01-25 DIAGNOSIS — F430 Acute stress reaction: Secondary | ICD-10-CM | POA: Diagnosis not present

## 2016-01-25 DIAGNOSIS — D692 Other nonthrombocytopenic purpura: Secondary | ICD-10-CM | POA: Diagnosis not present

## 2016-01-25 DIAGNOSIS — K5900 Constipation, unspecified: Secondary | ICD-10-CM | POA: Diagnosis not present

## 2016-01-25 DIAGNOSIS — Z638 Other specified problems related to primary support group: Secondary | ICD-10-CM | POA: Diagnosis not present

## 2016-01-25 DIAGNOSIS — I5189 Other ill-defined heart diseases: Secondary | ICD-10-CM | POA: Diagnosis not present

## 2016-01-25 DIAGNOSIS — Z1389 Encounter for screening for other disorder: Secondary | ICD-10-CM | POA: Diagnosis not present

## 2016-01-25 DIAGNOSIS — I493 Ventricular premature depolarization: Secondary | ICD-10-CM | POA: Diagnosis not present

## 2016-01-25 DIAGNOSIS — L719 Rosacea, unspecified: Secondary | ICD-10-CM | POA: Diagnosis not present

## 2016-01-25 DIAGNOSIS — I517 Cardiomegaly: Secondary | ICD-10-CM | POA: Diagnosis not present

## 2016-04-12 DIAGNOSIS — R030 Elevated blood-pressure reading, without diagnosis of hypertension: Secondary | ICD-10-CM | POA: Diagnosis not present

## 2016-04-12 DIAGNOSIS — F419 Anxiety disorder, unspecified: Secondary | ICD-10-CM | POA: Diagnosis not present

## 2016-04-14 DIAGNOSIS — L570 Actinic keratosis: Secondary | ICD-10-CM | POA: Diagnosis not present

## 2016-04-14 DIAGNOSIS — X32XXXD Exposure to sunlight, subsequent encounter: Secondary | ICD-10-CM | POA: Diagnosis not present

## 2016-04-14 DIAGNOSIS — D225 Melanocytic nevi of trunk: Secondary | ICD-10-CM | POA: Diagnosis not present

## 2016-04-17 DIAGNOSIS — I8000 Phlebitis and thrombophlebitis of superficial vessels of unspecified lower extremity: Secondary | ICD-10-CM | POA: Diagnosis not present

## 2016-04-17 DIAGNOSIS — Z6825 Body mass index (BMI) 25.0-25.9, adult: Secondary | ICD-10-CM | POA: Diagnosis not present

## 2016-04-26 DIAGNOSIS — M25571 Pain in right ankle and joints of right foot: Secondary | ICD-10-CM | POA: Diagnosis not present

## 2016-04-26 DIAGNOSIS — Z6825 Body mass index (BMI) 25.0-25.9, adult: Secondary | ICD-10-CM | POA: Diagnosis not present

## 2016-05-04 DIAGNOSIS — I8000 Phlebitis and thrombophlebitis of superficial vessels of unspecified lower extremity: Secondary | ICD-10-CM | POA: Diagnosis not present

## 2016-05-04 DIAGNOSIS — Z6824 Body mass index (BMI) 24.0-24.9, adult: Secondary | ICD-10-CM | POA: Diagnosis not present

## 2016-06-16 ENCOUNTER — Other Ambulatory Visit: Payer: Self-pay | Admitting: Internal Medicine

## 2016-06-16 DIAGNOSIS — Z1231 Encounter for screening mammogram for malignant neoplasm of breast: Secondary | ICD-10-CM

## 2016-06-17 ENCOUNTER — Emergency Department (HOSPITAL_BASED_OUTPATIENT_CLINIC_OR_DEPARTMENT_OTHER): Payer: PPO

## 2016-06-17 ENCOUNTER — Encounter (HOSPITAL_BASED_OUTPATIENT_CLINIC_OR_DEPARTMENT_OTHER): Payer: Self-pay | Admitting: *Deleted

## 2016-06-17 ENCOUNTER — Emergency Department (HOSPITAL_BASED_OUTPATIENT_CLINIC_OR_DEPARTMENT_OTHER)
Admission: EM | Admit: 2016-06-17 | Discharge: 2016-06-17 | Disposition: A | Discharge status: Home / Self Care | Payer: PPO | Attending: Emergency Medicine | Admitting: Emergency Medicine

## 2016-06-17 DIAGNOSIS — I10 Essential (primary) hypertension: Secondary | ICD-10-CM | POA: Diagnosis not present

## 2016-06-17 DIAGNOSIS — Z7982 Long term (current) use of aspirin: Secondary | ICD-10-CM | POA: Diagnosis not present

## 2016-06-17 DIAGNOSIS — K5730 Diverticulosis of large intestine without perforation or abscess without bleeding: Secondary | ICD-10-CM | POA: Diagnosis not present

## 2016-06-17 DIAGNOSIS — E7800 Pure hypercholesterolemia, unspecified: Secondary | ICD-10-CM | POA: Insufficient documentation

## 2016-06-17 DIAGNOSIS — R1084 Generalized abdominal pain: Secondary | ICD-10-CM | POA: Diagnosis not present

## 2016-06-17 DIAGNOSIS — Z79899 Other long term (current) drug therapy: Secondary | ICD-10-CM | POA: Insufficient documentation

## 2016-06-17 LAB — URINALYSIS, ROUTINE W REFLEX MICROSCOPIC
BILIRUBIN URINE: NEGATIVE
Glucose, UA: NEGATIVE mg/dL
Hgb urine dipstick: NEGATIVE
Ketones, ur: 40 mg/dL — AB
NITRITE: NEGATIVE
Protein, ur: NEGATIVE mg/dL
SPECIFIC GRAVITY, URINE: 1.018 (ref 1.005–1.030)
pH: 5 (ref 5.0–8.0)

## 2016-06-17 LAB — CBC WITH DIFFERENTIAL/PLATELET
BASOS PCT: 1 %
Basophils Absolute: 0 10*3/uL (ref 0.0–0.1)
EOS ABS: 0 10*3/uL (ref 0.0–0.7)
EOS PCT: 1 %
HCT: 37.8 % (ref 36.0–46.0)
Hemoglobin: 12.9 g/dL (ref 12.0–15.0)
LYMPHS ABS: 1.2 10*3/uL (ref 0.7–4.0)
Lymphocytes Relative: 30 %
MCH: 30.4 pg (ref 26.0–34.0)
MCHC: 34.1 g/dL (ref 30.0–36.0)
MCV: 89.2 fL (ref 78.0–100.0)
MONOS PCT: 8 %
Monocytes Absolute: 0.3 10*3/uL (ref 0.1–1.0)
NEUTROS PCT: 60 %
Neutro Abs: 2.4 10*3/uL (ref 1.7–7.7)
PLATELETS: 203 10*3/uL (ref 150–400)
RBC: 4.24 MIL/uL (ref 3.87–5.11)
RDW: 13 % (ref 11.5–15.5)
WBC: 3.9 10*3/uL — ABNORMAL LOW (ref 4.0–10.5)

## 2016-06-17 LAB — COMPREHENSIVE METABOLIC PANEL
ALBUMIN: 4.7 g/dL (ref 3.5–5.0)
ALT: 14 U/L (ref 14–54)
ANION GAP: 10 (ref 5–15)
AST: 19 U/L (ref 15–41)
Alkaline Phosphatase: 88 U/L (ref 38–126)
BUN: 14 mg/dL (ref 6–20)
CALCIUM: 9.7 mg/dL (ref 8.9–10.3)
CHLORIDE: 106 mmol/L (ref 101–111)
CO2: 24 mmol/L (ref 22–32)
Creatinine, Ser: 0.63 mg/dL (ref 0.44–1.00)
GFR calc non Af Amer: >60 mL/min (ref >60)
GLUCOSE: 113 mg/dL — AB (ref 65–99)
POTASSIUM: 3.7 mmol/L (ref 3.5–5.1)
SODIUM: 140 mmol/L (ref 135–145)
Total Bilirubin: 0.6 mg/dL (ref 0.3–1.2)
Total Protein: 7.8 g/dL (ref 6.5–8.1)

## 2016-06-17 LAB — URINE MICROSCOPIC-ADD ON: RBC / HPF: NONE SEEN RBC/hpf (ref 0–5)

## 2016-06-17 LAB — I-STAT CG4 LACTIC ACID, ED: Lactic Acid, Venous: 0.51 mmol/L (ref 0.5–2.0)

## 2016-06-17 LAB — LIPASE, BLOOD: Lipase: 24 U/L (ref 11–51)

## 2016-06-17 MED ORDER — IOPAMIDOL (ISOVUE-300) INJECTION 61%
100.0000 mL | Freq: Once | INTRAVENOUS | Status: AC | PRN
  Administered 2016-06-17: 100 mL via INTRAVENOUS

## 2016-06-17 MED ORDER — TRAMADOL HCL 50 MG PO TABS: 50.0000 mg | ORAL_TABLET | Freq: Four times a day (QID) | ORAL | Status: DC | PRN

## 2016-06-17 MED ORDER — SODIUM CHLORIDE 0.9 % IV BOLUS (SEPSIS)
1000.0000 mL | Freq: Once | INTRAVENOUS | Status: AC
  Administered 2016-06-17: 1000 mL via INTRAVENOUS

## 2016-06-17 MED ORDER — ONDANSETRON HCL 4 MG/2ML IJ SOLN
4.0000 mg | Freq: Once | INTRAMUSCULAR | Status: AC
  Administered 2016-06-17: 4 mg via INTRAVENOUS
  Filled 2016-06-17: qty 2

## 2016-06-17 NOTE — ED Provider Notes (Signed)
CSN: [id number]     Arrival date & time 06/17/16  1422 History   First MD Initiated Contact with Patient 06/17/16 1811     Chief Complaint  Patient presents with  . Abdominal Pain     (Consider location/radiation/quality/duration/timing/severity/associated sxs/prior Treatment) HPI Patient presents to the emergency department with Abdominal pain That started Wednesday.  Patient states that her abdominal pain seems to be mostly right-sided, but does have some diffuse component to it.  She may have a kidney stone.  Patient has had nausea but no vomiting.  Nothing seems to make the condition better, but palpation makes the pain worseThe patient denies chest pain, shortness of breath, headache,blurred vision, neck pain, fever, cough, weakness, numbness, dizziness, anorexia, edema,  vomiting, diarrhea, rash, back pain, dysuria, hematemesis, bloody stool, near syncope, or syncope.  Patient did not take any medications prior to arrival Past Medical History  Diagnosis Date  . Hypertension   . High cholesterol   . IBS (irritable bowel syndrome)    Past Surgical History  Procedure Laterality Date  . Total abdominal hysterectomy    . Cholecystectomy    . Breast surgery      Benign   Family History  Problem Relation Age of Onset  . Alzheimer's disease Mother   . Alzheimer's disease Brother   . Alzheimer's disease Sister   . CAD Brother 39    CABG  . Cancer Sister     Breast  . Cancer Brother     Lung   Social History  Substance Use Topics  . Smoking status: Never Smoker   . Smokeless tobacco: None  . Alcohol Use: No   OB History    No data available     Review of Systems All other systems negative except as documented in the HPI. All pertinent positives and negatives as reviewed in the HPI.  Allergies  Pneumococcal vaccines and Codeine  Home Medications   Prior to Admission medications   Medication Sig Start Date End Date Taking? Authorizing Provider  amLODipine (NORVASC)  5 MG tablet Take 1 tablet by mouth daily. 06/22/14   Historical Provider, MD  aspirin EC 81 MG tablet Take 81 mg by mouth daily.    Historical Provider, MD  Cholecalciferol (VITAMIN D PO) Take 1 tablet by mouth daily. 500iu    Historical Provider, MD  clonazePAM (KLONOPIN) 0.5 MG tablet Take 0.25-0.5 mg by mouth 2 (two) times daily as needed. sleep    Historical Provider, MD  doxazosin (CARDURA) 1 MG tablet Take 1 tablet by mouth at bedtime. 06/22/14   Historical Provider, MD  fexofenadine (ALLEGRA ALLERGY) 180 MG tablet Take 180 mg by mouth daily.    Historical Provider, MD  ibuprofen (ADVIL,MOTRIN) 200 MG tablet Take 200-600 mg by mouth every 6 (six) hours as needed. Headache or restless leg pain    Historical Provider, MD  pravastatin (PRAVACHOL) 40 MG tablet Take 40 mg by mouth at bedtime.    Historical Provider, MD   BP 157/64 mmHg  Pulse 65  Temp(Src) 97.9 F (36.6 C) (Oral)  Resp 16  Ht 5\' 5"  (1.651 m)  Wt 68.04 kg  BMI 24.96 kg/m2  SpO2 99% Physical Exam  Constitutional: She is oriented to person, place, and time. She appears well-developed and well-nourished. No distress.  HENT:  Head: Normocephalic and atraumatic.  Mouth/Throat: Oropharynx is clear and moist.  Eyes: Pupils are equal, round, and reactive to light.  Neck: Normal range of motion. Neck supple.  Cardiovascular: Normal  rate, regular rhythm and normal heart sounds.  Exam reveals no gallop and no friction rub.   No murmur heard. Pulmonary/Chest: Effort normal and breath sounds normal. No respiratory distress. She has no wheezes.  Abdominal: Soft. Normal appearance and bowel sounds are normal. She exhibits no distension. There is generalized tenderness. There is no rigidity, no rebound and no guarding. No hernia.  Neurological: She is alert and oriented to person, place, and time. She exhibits normal muscle tone. Coordination normal.  Skin: Skin is warm and dry. No rash noted. No erythema.  Psychiatric: She has a normal  mood and affect. Her behavior is normal.  Nursing note and vitals reviewed.   ED Course  Procedures (including critical care time) Labs Review Labs Reviewed  CBC WITH DIFFERENTIAL/PLATELET - Abnormal; Notable for the following:    WBC 3.9 (*)    All other components within normal limits  COMPREHENSIVE METABOLIC PANEL - Abnormal; Notable for the following:    Glucose, Bld 113 (*)    All other components within normal limits  URINALYSIS, ROUTINE W REFLEX MICROSCOPIC (NOT AT [facility]) - Abnormal; Notable for the following:    Ketones, ur 40 (*)    Leukocytes, UA SMALL (*)    All other components within normal limits  URINE MICROSCOPIC-ADD ON - Abnormal; Notable for the following:    Squamous Epithelial / LPF 0-5 (*)    Bacteria, UA FEW (*)    All other components within normal limits  LIPASE, BLOOD  I-STAT CG4 LACTIC ACID, ED    Imaging Review Ct Abdomen Pelvis W Contrast  06/17/2016  CLINICAL DATA:  Acute onset of right lower quadrant abdominal pain and nausea. Initial encounter. EXAM: CT ABDOMEN AND PELVIS WITH CONTRAST TECHNIQUE: Multidetector CT imaging of the abdomen and pelvis was performed using the standard protocol following bolus administration of intravenous contrast. CONTRAST:  163mL ISOVUE-300 IOPAMIDOL (ISOVUE-300) INJECTION 61% COMPARISON:  None. FINDINGS: The visualized lung bases are clear. A vague 3.4 cm hypodensity is noted at the inferior right hepatic lobe. The liver and spleen are otherwise unremarkable. The patient is status post cholecystectomy, with clips noted at the gallbladder fossa. The adrenal glands are unremarkable in appearance. A vague mildly heterogeneous 1.9 cm cystic lesion is noted at the inferior aspect of the pancreatic head. MRCP would be helpful for further evaluation. Prominent bilateral parapelvic renal cysts are seen. The kidneys are otherwise unremarkable. There is no evidence of hydronephrosis. No renal or ureteral stones are seen. No perinephric  stranding is appreciated. No free fluid is identified. The small bowel is unremarkable in appearance. The stomach is within normal limits. No acute vascular abnormalities are seen. Scattered calcification is seen along the abdominal aorta and its branches. The appendix is normal in caliber, best characterized on coronal images, without evidence of appendicitis. The contrast progresses to the level of the mid transverse colon. Scattered diverticulosis is noted along the sigmoid colon, without evidence of diverticulitis. The bladder is mildly distended and grossly unremarkable. The patient is status post hysterectomy. No suspicious adnexal masses are seen. No inguinal lymphadenopathy is seen. No acute osseous abnormalities are identified. Vacuum phenomenon is noted at L4-L5. IMPRESSION: 1. No acute abnormality seen to explain the patient's symptoms. 2. Vague mildly heterogeneous 1.9 cm cystic lesion at the inferior aspect of the pancreatic head. MRCP would be helpful for further evaluation. 3. Vague 3.4 cm hypodensity at the inferior right hepatic lobe. This could also be assessed during the MRCP, with a few additional dynamic  liver sequences. 4. Scattered diverticulosis along the sigmoid colon, without evidence of diverticulitis. 5. Scattered calcification along the abdominal aorta and its branches. 6. Prominent bilateral parapelvic renal cysts noted. Electronically Signed   By: Garald Balding M.D.   On: 06/17/2016 21:03   I have personally reviewed and evaluated these images and lab results as part of my medical decision-making.    Patient's CT scan does not show any significant abnormality.  We will however have her follow-up with her primary care doctor on this pancreatic issue.  The patient is advised plan and all questions were answered.  There is no clear indication as to what is causing her abdominal pain.  She technically could have passed a kidney stone, but this is not definitive.  Patient does not  have any blood in her urine.  I did advise the patient that this could be an evolving process and that she will need to return here for any worsening in her condition  Dalia Heading, PA-C 06/17/16 2151  Dalia Heading, PA-C 06/19/16 0127  Ezequiel Essex, MD 06/19/16 [phone]

## 2016-06-17 NOTE — ED Notes (Signed)
Increasing RLQ which began on Wednesday and has been increasing.  No pain or burning with urination.  Pt has been having nausea with this.  Last BM was this am (after she took a stool softener).

## 2016-06-17 NOTE — Discharge Instructions (Signed)
Return here as needed.  Followup with your primary care Dr. for recheck °

## 2016-06-20 ENCOUNTER — Other Ambulatory Visit (HOSPITAL_COMMUNITY): Payer: Self-pay | Admitting: Internal Medicine

## 2016-06-20 ENCOUNTER — Ambulatory Visit (HOSPITAL_COMMUNITY)
Admission: RE | Admit: 2016-06-20 | Discharge: 2016-06-20 | Disposition: A | Discharge status: Home / Self Care | Payer: PPO | Source: Ambulatory Visit | Attending: Internal Medicine | Admitting: Internal Medicine

## 2016-06-20 DIAGNOSIS — K862 Cyst of pancreas: Secondary | ICD-10-CM

## 2016-06-20 DIAGNOSIS — K769 Liver disease, unspecified: Secondary | ICD-10-CM | POA: Insufficient documentation

## 2016-06-20 DIAGNOSIS — K7689 Other specified diseases of liver: Secondary | ICD-10-CM | POA: Diagnosis not present

## 2016-06-20 MED ORDER — GADOBENATE DIMEGLUMINE 529 MG/ML IV SOLN
14.0000 mL | Freq: Once | INTRAVENOUS | Status: AC | PRN
  Administered 2016-06-20: 16 mL via INTRAVENOUS

## 2016-06-21 DIAGNOSIS — K769 Liver disease, unspecified: Secondary | ICD-10-CM | POA: Diagnosis not present

## 2016-06-26 DIAGNOSIS — R1084 Generalized abdominal pain: Secondary | ICD-10-CM | POA: Diagnosis not present

## 2016-06-26 DIAGNOSIS — R932 Abnormal findings on diagnostic imaging of liver and biliary tract: Secondary | ICD-10-CM | POA: Diagnosis not present

## 2016-06-26 DIAGNOSIS — R634 Abnormal weight loss: Secondary | ICD-10-CM | POA: Diagnosis not present

## 2016-06-28 ENCOUNTER — Other Ambulatory Visit (HOSPITAL_COMMUNITY): Payer: Self-pay | Admitting: Gastroenterology

## 2016-06-28 DIAGNOSIS — K769 Liver disease, unspecified: Secondary | ICD-10-CM

## 2016-07-06 ENCOUNTER — Other Ambulatory Visit: Payer: Self-pay | Admitting: Radiology

## 2016-07-07 ENCOUNTER — Other Ambulatory Visit: Payer: Self-pay | Admitting: Radiology

## 2016-07-10 ENCOUNTER — Ambulatory Visit (HOSPITAL_COMMUNITY)
Admission: RE | Admit: 2016-07-10 | Discharge: 2016-07-10 | Disposition: A | Discharge status: Home / Self Care | Payer: PPO | Source: Ambulatory Visit | Attending: Gastroenterology | Admitting: Gastroenterology

## 2016-07-10 ENCOUNTER — Encounter (HOSPITAL_COMMUNITY): Payer: Self-pay

## 2016-07-10 DIAGNOSIS — R160 Hepatomegaly, not elsewhere classified: Secondary | ICD-10-CM | POA: Insufficient documentation

## 2016-07-10 DIAGNOSIS — Z79899 Other long term (current) drug therapy: Secondary | ICD-10-CM | POA: Insufficient documentation

## 2016-07-10 DIAGNOSIS — Z7982 Long term (current) use of aspirin: Secondary | ICD-10-CM | POA: Insufficient documentation

## 2016-07-10 DIAGNOSIS — C227 Other specified carcinomas of liver: Secondary | ICD-10-CM | POA: Diagnosis not present

## 2016-07-10 DIAGNOSIS — K7689 Other specified diseases of liver: Secondary | ICD-10-CM | POA: Diagnosis not present

## 2016-07-10 DIAGNOSIS — E7800 Pure hypercholesterolemia, unspecified: Secondary | ICD-10-CM | POA: Diagnosis not present

## 2016-07-10 DIAGNOSIS — I10 Essential (primary) hypertension: Secondary | ICD-10-CM | POA: Insufficient documentation

## 2016-07-10 DIAGNOSIS — K769 Liver disease, unspecified: Secondary | ICD-10-CM | POA: Diagnosis not present

## 2016-07-10 DIAGNOSIS — C229 Malignant neoplasm of liver, not specified as primary or secondary: Secondary | ICD-10-CM | POA: Diagnosis not present

## 2016-07-10 LAB — CBC
HEMATOCRIT: 38.7 % (ref 36.0–46.0)
Hemoglobin: 12.8 g/dL (ref 12.0–15.0)
MCH: 29.4 pg (ref 26.0–34.0)
MCHC: 33.1 g/dL (ref 30.0–36.0)
MCV: 89 fL (ref 78.0–100.0)
Platelets: 203 10*3/uL (ref 150–400)
RBC: 4.35 MIL/uL (ref 3.87–5.11)
RDW: 13 % (ref 11.5–15.5)
WBC: 4.7 10*3/uL (ref 4.0–10.5)

## 2016-07-10 LAB — PROTIME-INR
INR: 1.01 (ref 0.00–1.49)
Prothrombin Time: 13.5 seconds (ref 11.6–15.2)

## 2016-07-10 LAB — APTT: APTT: 27 s (ref 24–37)

## 2016-07-10 MED ORDER — LIDOCAINE HCL (PF) 1 % IJ SOLN
INTRAMUSCULAR | Status: AC
  Filled 2016-07-10: qty 5

## 2016-07-10 MED ORDER — FENTANYL CITRATE (PF) 100 MCG/2ML IJ SOLN
INTRAMUSCULAR | Status: AC
  Filled 2016-07-10: qty 2

## 2016-07-10 MED ORDER — GELATIN ABSORBABLE 12-7 MM EX MISC
CUTANEOUS | Status: AC
  Filled 2016-07-10: qty 1

## 2016-07-10 MED ORDER — MIDAZOLAM HCL 2 MG/2ML IJ SOLN
INTRAMUSCULAR | Status: AC
  Filled 2016-07-10: qty 2

## 2016-07-10 MED ORDER — SODIUM CHLORIDE 0.9 % IV SOLN: INTRAVENOUS | Status: DC

## 2016-07-10 MED ORDER — MIDAZOLAM HCL 2 MG/2ML IJ SOLN
INTRAMUSCULAR | Status: AC | PRN
  Administered 2016-07-10: 1 mg via INTRAVENOUS

## 2016-07-10 MED ORDER — FENTANYL CITRATE (PF) 100 MCG/2ML IJ SOLN
INTRAMUSCULAR | Status: AC | PRN
  Administered 2016-07-10: 50 ug via INTRAVENOUS

## 2016-07-10 NOTE — Sedation Documentation (Signed)
Patient is resting comfortably. 

## 2016-07-10 NOTE — Sedation Documentation (Signed)
Patient denies pain and is resting comfortably.  

## 2016-07-10 NOTE — Procedures (Signed)
Interventional Radiology Procedure Note  Procedure: US guided biopsy of right liver mass.  5 x 18G core.  Complications: None Recommendations:  - Ok to shower tomorrow - Do not submerge for 7 days - observe 2 hours - follow up pathology - Routine wound care   Signed,  Dulcy Fanny. Earleen Newport, DO

## 2016-07-10 NOTE — Discharge Instructions (Signed)

## 2016-07-10 NOTE — H&P (Signed)
Chief Complaint: Patient was seen in consultation today for liver lesion biopsy at the request of Magod,Marc  Referring Physician(s): Magod,Marc  Supervising Physician: Corrie Mckusick  Patient Status: Outpatient  History of Present Illness: Laura [name] is a 75 y.o. female being worked up for newly found liver lesion. She is referred for US guided biopsy. PMHx, meds, labs, imaging reviewed. Has been NPO Family at bedside.  Past Medical History  Diagnosis Date  . Hypertension   . High cholesterol   . IBS (irritable bowel syndrome)     Past Surgical History  Procedure Laterality Date  . Total abdominal hysterectomy    . Cholecystectomy    . Breast surgery      Benign    Allergies: Pneumococcal vaccines and Codeine  Medications: Prior to Admission medications   Medication Sig Start Date End Date Taking? Authorizing Provider  amLODipine (NORVASC) 5 MG tablet Take 1 tablet by mouth daily. 06/22/14  Yes Historical Provider, MD  aspirin EC 81 MG tablet Take 81 mg by mouth daily.   Yes Historical Provider, MD  Cholecalciferol (VITAMIN D PO) Take 1 tablet by mouth daily. 500iu   Yes Historical Provider, MD  clonazePAM (KLONOPIN) 0.5 MG tablet Take 0.25-0.5 mg by mouth 2 (two) times daily as needed for anxiety.    Yes Historical Provider, MD  doxazosin (CARDURA) 1 MG tablet Take 1 tablet by mouth at bedtime. 06/22/14  Yes Historical Provider, MD  pravastatin (PRAVACHOL) 40 MG tablet Take 40 mg by mouth at bedtime.   Yes Historical Provider, MD  tetrahydrozoline 0.05 % ophthalmic solution Place 1 drop into both eyes 2 (two) times daily as needed (dry eye).   Yes Historical Provider, MD  traMADol (ULTRAM) 50 MG tablet Take 1 tablet (50 mg total) by mouth every 6 (six) hours as needed for severe pain. 06/17/16  Yes Christopher Lawyer, PA-C  ibuprofen (ADVIL,MOTRIN) 200 MG tablet Take 200-600 mg by mouth every 6 (six) hours as needed. Headache or restless leg pain    Historical  Provider, MD     Family History  Problem Relation Age of Onset  . Alzheimer's disease Mother   . Alzheimer's disease Brother   . Alzheimer's disease Sister   . CAD Brother 35    CABG  . Cancer Sister     Breast  . Cancer Brother     Lung    Social History   Social History  . Marital Status: Married    Spouse Name: N/A  . Number of Children: 3  . Years of Education: N/A   Social History Main Topics  . Smoking status: Never Smoker   . Smokeless tobacco: None  . Alcohol Use: No  . Drug Use: No  . Sexual Activity: Not Asked   Other Topics Concern  . None   Social History Narrative   Lives at home with husband.    Review of Systems: A 12 point ROS discussed and pertinent positives are indicated in the HPI above.  All other systems are negative.  Review of Systems  Vital Signs: BP 155/62 mmHg  Pulse 65  Temp(Src) 98 F (36.7 C)  Resp 16  Ht 5\' 5"  (1.651 m)  Wt 147 lb (66.679 kg)  BMI 24.46 kg/m2  SpO2 98%  Physical Exam  Constitutional: She is oriented to person, place, and time. She appears well-developed and well-nourished. No distress.  HENT:  Head: Normocephalic.  Mouth/Throat: Oropharynx is clear and moist.  Neck: Normal range of  motion. No tracheal deviation present.  Cardiovascular: Normal rate, regular rhythm and normal heart sounds.   Pulmonary/Chest: Effort normal and breath sounds normal. No respiratory distress.  Neurological: She is alert and oriented to person, place, and time.  Psychiatric: She has a normal mood and affect. Judgment normal.    Mallampati Score:  MD Evaluation Airway: WNL Heart: WNL Abdomen: WNL Chest/ Lungs: WNL ASA  Classification: 2 Mallampati/Airway Score: One  Imaging: Ct Abdomen Pelvis W Contrast  06/17/2016  CLINICAL DATA:  Acute onset of right lower quadrant abdominal pain and nausea. Initial encounter. EXAM: CT ABDOMEN AND PELVIS WITH CONTRAST TECHNIQUE: Multidetector CT imaging of the abdomen and pelvis  was performed using the standard protocol following bolus administration of intravenous contrast. CONTRAST:  162mL ISOVUE-300 IOPAMIDOL (ISOVUE-300) INJECTION 61% COMPARISON:  None. FINDINGS: The visualized lung bases are clear. A vague 3.4 cm hypodensity is noted at the inferior right hepatic lobe. The liver and spleen are otherwise unremarkable. The patient is status post cholecystectomy, with clips noted at the gallbladder fossa. The adrenal glands are unremarkable in appearance. A vague mildly heterogeneous 1.9 cm cystic lesion is noted at the inferior aspect of the pancreatic head. MRCP would be helpful for further evaluation. Prominent bilateral parapelvic renal cysts are seen. The kidneys are otherwise unremarkable. There is no evidence of hydronephrosis. No renal or ureteral stones are seen. No perinephric stranding is appreciated. No free fluid is identified. The small bowel is unremarkable in appearance. The stomach is within normal limits. No acute vascular abnormalities are seen. Scattered calcification is seen along the abdominal aorta and its branches. The appendix is normal in caliber, best characterized on coronal images, without evidence of appendicitis. The contrast progresses to the level of the mid transverse colon. Scattered diverticulosis is noted along the sigmoid colon, without evidence of diverticulitis. The bladder is mildly distended and grossly unremarkable. The patient is status post hysterectomy. No suspicious adnexal masses are seen. No inguinal lymphadenopathy is seen. No acute osseous abnormalities are identified. Vacuum phenomenon is noted at L4-L5. IMPRESSION: 1. No acute abnormality seen to explain the patient's symptoms. 2. Vague mildly heterogeneous 1.9 cm cystic lesion at the inferior aspect of the pancreatic head. MRCP would be helpful for further evaluation. 3. Vague 3.4 cm hypodensity at the inferior right hepatic lobe. This could also be assessed during the MRCP, with a few  additional dynamic liver sequences. 4. Scattered diverticulosis along the sigmoid colon, without evidence of diverticulitis. 5. Scattered calcification along the abdominal aorta and its branches. 6. Prominent bilateral parapelvic renal cysts noted. Electronically Signed   By: Garald Balding M.D.   On: 06/17/2016 21:03   Mr 3d Recon At Scanner  06/21/2016  CLINICAL DATA:  Initial evaluation for RIGHT lower quadrant pain and nausea. Indeterminate hepatic lesion on CT. Cystic pancreatic lesions EXAM: MRI ABDOMEN WITHOUT AND WITH CONTRAST (INCLUDING MRCP) TECHNIQUE: Multiplanar multisequence MR imaging of the abdomen was performed both before and after the administration of intravenous contrast. Heavily T2-weighted images of the biliary and pancreatic ducts were obtained, and three-dimensional MRCP images were rendered by post processing. CONTRAST:  10mL MULTIHANCE GADOBENATE DIMEGLUMINE 529 MG/ML IV SOLN COMPARISON:  CT 06/17/2016 FINDINGS: Lower chest:  Lung bases are clear. Hepatobiliary: Within the RIGHT hepatic lobe (segment 6) there is a well-circumscribed lesion which is centrally hypointense with a rim of high signal intensity on T2 weighted imaging measuring 35 x 27 mm (image 26, series 3). Lesion demonstrates mild restricted diffusion. Lesion is hypo intense  to liver parenchymal on T1 weighted imaging (series 1300) and demonstrates peripheral than central delayed enhancement (series 1302-1305) There is no biliary duct dilatation. The post cholecystectomy. Common bile duct caliber is upper limits of normal at 6 mm. No filling defect or external compression. Pancreas: There is a cystic lesion in the uncinate of the pancreas measuring 16 mm by 9 mm (image 33, series 10). There is a thin septation in this lesion which is high signal intensity T2 weighted imaging. No post-contrast enhancement is evident. There is no evidence of communication with the pancreatic duct. The pancreatic duct is normal. There is  normal pancreatic parenchyma of the body, head and tail the pancreas. Spleen: The spleen.  Two small splenules ventral to the spleen. Adrenals/urinary tract: Adrenal glands and kidneys are normal. Stomach/Bowel: Stomach and limited of the small bowel is unremarkable Vascular/Lymphatic: Abdominal aortic normal caliber. No retroperitoneal periportal lymphadenopathy. Musculoskeletal: No aggressive osseous lesion IMPRESSION: 1. Enhancing lesion in the RIGHT hepatic lobe does NOT have imaging characteristics typical of benign lesions of the liver. Differential consideration would include metastatic lesion versus isolated cholangiocarcinoma with METASTATIC LESION FAVORED. 2. Cystic lesion in the uncinate process has no worrisome characteristics. Recommend a single follow-up MRI new 12 months. This recommendation follows ACR consensus guidelines: Managing Incidental Findings on Abdominal CT: White Paper of the ACR Incidental Findings Committee. Joellyn Rued Radiol [phone] Electronically Signed   By: Suzy Bouchard M.D.   On: 06/21/2016 09:13   Mr Jeananne Rama W/wo Cm/mrcp  06/21/2016  CLINICAL DATA:  Initial evaluation for RIGHT lower quadrant pain and nausea. Indeterminate hepatic lesion on CT. Cystic pancreatic lesions EXAM: MRI ABDOMEN WITHOUT AND WITH CONTRAST (INCLUDING MRCP) TECHNIQUE: Multiplanar multisequence MR imaging of the abdomen was performed both before and after the administration of intravenous contrast. Heavily T2-weighted images of the biliary and pancreatic ducts were obtained, and three-dimensional MRCP images were rendered by post processing. CONTRAST:  36mL MULTIHANCE GADOBENATE DIMEGLUMINE 529 MG/ML IV SOLN COMPARISON:  CT 06/17/2016 FINDINGS: Lower chest:  Lung bases are clear. Hepatobiliary: Within the RIGHT hepatic lobe (segment 6) there is a well-circumscribed lesion which is centrally hypointense with a rim of high signal intensity on T2 weighted imaging measuring 35 x 27 mm (image 26, series  3). Lesion demonstrates mild restricted diffusion. Lesion is hypo intense to liver parenchymal on T1 weighted imaging (series 1300) and demonstrates peripheral than central delayed enhancement (series 1302-1305) There is no biliary duct dilatation. The post cholecystectomy. Common bile duct caliber is upper limits of normal at 6 mm. No filling defect or external compression. Pancreas: There is a cystic lesion in the uncinate of the pancreas measuring 16 mm by 9 mm (image 33, series 10). There is a thin septation in this lesion which is high signal intensity T2 weighted imaging. No post-contrast enhancement is evident. There is no evidence of communication with the pancreatic duct. The pancreatic duct is normal. There is normal pancreatic parenchyma of the body, head and tail the pancreas. Spleen: The spleen.  Two small splenules ventral to the spleen. Adrenals/urinary tract: Adrenal glands and kidneys are normal. Stomach/Bowel: Stomach and limited of the small bowel is unremarkable Vascular/Lymphatic: Abdominal aortic normal caliber. No retroperitoneal periportal lymphadenopathy. Musculoskeletal: No aggressive osseous lesion IMPRESSION: 1. Enhancing lesion in the RIGHT hepatic lobe does NOT have imaging characteristics typical of benign lesions of the liver. Differential consideration would include metastatic lesion versus isolated cholangiocarcinoma with METASTATIC LESION FAVORED. 2. Cystic lesion in the uncinate process has no  worrisome characteristics. Recommend a single follow-up MRI new 12 months. This recommendation follows ACR consensus guidelines: Managing Incidental Findings on Abdominal CT: White Paper of the ACR Incidental Findings Committee. Joellyn Rued Radiol [phone] Electronically Signed   By: Suzy Bouchard M.D.   On: 06/21/2016 09:13    Labs:  CBC:  Recent Labs  06/17/16 1443 07/10/16 1210  WBC 3.9* 4.7  HGB 12.9 12.8  HCT 37.8 38.7  PLT 203 203    COAGS:  Recent Labs   07/10/16 1210  INR 1.01  APTT 27    BMP:  Recent Labs  06/17/16 1443  NA 140  K 3.7  CL 106  CO2 24  GLUCOSE 113*  BUN 14  CALCIUM 9.7  CREATININE 0.63  GFRNONAA >60  GFRAA >60    LIVER FUNCTION TESTS:  Recent Labs  06/17/16 1443  BILITOT 0.6  AST 19  ALT 14  ALKPHOS 88  PROT 7.8  ALBUMIN 4.7    TUMOR MARKERS: No results for input(s): AFPTM, CEA, CA199, CHROMGRNA in the last 8760 hours.  Assessment and Plan: (R)lobe liver lesion For US guided liver lesion biopsy Labs reviewed Risks and Benefits discussed with the patient including, but not limited to bleeding, infection, damage to adjacent structures or low yield requiring additional tests. All of the patient's questions were answered, patient is agreeable to proceed. Consent signed and in chart.    Thank you for this interesting consult.  I greatly enjoyed meeting Laura [name] and look forward to participating in their care.  A copy of this report was sent to the requesting provider on this date.  Electronically Signed: Ascencion Dike 07/10/2016, 1:44 PM   I spent a total of 20 minutesin face to face in clinical consultation, greater than 50% of which was counseling/coordinating care for liver biopsy

## 2016-07-13 DIAGNOSIS — C228 Malignant neoplasm of liver, primary, unspecified as to type: Secondary | ICD-10-CM | POA: Diagnosis not present

## 2016-07-14 ENCOUNTER — Other Ambulatory Visit: Payer: Self-pay | Admitting: Internal Medicine

## 2016-07-14 ENCOUNTER — Ambulatory Visit (HOSPITAL_COMMUNITY)
Admission: RE | Admit: 2016-07-14 | Discharge: 2016-07-14 | Disposition: A | Discharge status: Home / Self Care | Payer: PPO | Source: Ambulatory Visit | Attending: Cardiology | Admitting: Cardiology

## 2016-07-14 ENCOUNTER — Other Ambulatory Visit (HOSPITAL_COMMUNITY): Payer: Self-pay | Admitting: Internal Medicine

## 2016-07-14 ENCOUNTER — Telehealth: Payer: Self-pay | Admitting: *Deleted

## 2016-07-14 DIAGNOSIS — R609 Edema, unspecified: Secondary | ICD-10-CM | POA: Diagnosis not present

## 2016-07-14 DIAGNOSIS — I10 Essential (primary) hypertension: Secondary | ICD-10-CM | POA: Insufficient documentation

## 2016-07-14 DIAGNOSIS — E7800 Pure hypercholesterolemia, unspecified: Secondary | ICD-10-CM | POA: Diagnosis not present

## 2016-07-14 DIAGNOSIS — M25562 Pain in left knee: Secondary | ICD-10-CM | POA: Diagnosis not present

## 2016-07-14 DIAGNOSIS — M79662 Pain in left lower leg: Secondary | ICD-10-CM | POA: Diagnosis not present

## 2016-07-14 DIAGNOSIS — C228 Malignant neoplasm of liver, primary, unspecified as to type: Secondary | ICD-10-CM | POA: Diagnosis not present

## 2016-07-14 NOTE — Telephone Encounter (Signed)
Oncology Nurse Navigator Documentation  Oncology Nurse Navigator Flowsheets 07/14/2016  Navigator Location [facility]  Navigator Encounter Type Introductory phone call  Abnormal Finding Date 06/17/2016  Confirmed Diagnosis Date 07/10/2016  Spoke with patient and provided new patient appointment for 07/19/16 at 1045/1100 with Dr. Benay Spice at request of Dr. Watt Climes. Informed of location of [place], valet service, and registration process. Reminded to bring photo ID, insurance cards and a current medication list, including supplements. Patient verbalizes understanding. Notified HIM to enter appointment into EPIC.

## 2016-07-19 ENCOUNTER — Encounter: Payer: Self-pay | Admitting: *Deleted

## 2016-07-19 ENCOUNTER — Other Ambulatory Visit: Payer: Self-pay | Admitting: *Deleted

## 2016-07-19 ENCOUNTER — Ambulatory Visit (HOSPITAL_BASED_OUTPATIENT_CLINIC_OR_DEPARTMENT_OTHER): Payer: PPO | Admitting: Oncology

## 2016-07-19 ENCOUNTER — Encounter: Payer: Self-pay | Admitting: Genetic Counselor

## 2016-07-19 ENCOUNTER — Telehealth: Payer: Self-pay | Admitting: Oncology

## 2016-07-19 VITALS — BP 152/65 | HR 97 | Temp 98.4°F | Resp 18 | Ht 65.0 in | Wt 145.5 lb

## 2016-07-19 DIAGNOSIS — C801 Malignant (primary) neoplasm, unspecified: Secondary | ICD-10-CM | POA: Diagnosis not present

## 2016-07-19 DIAGNOSIS — C787 Secondary malignant neoplasm of liver and intrahepatic bile duct: Secondary | ICD-10-CM

## 2016-07-19 DIAGNOSIS — R110 Nausea: Secondary | ICD-10-CM | POA: Diagnosis not present

## 2016-07-19 DIAGNOSIS — C7951 Secondary malignant neoplasm of bone: Secondary | ICD-10-CM | POA: Diagnosis not present

## 2016-07-19 DIAGNOSIS — I10 Essential (primary) hypertension: Secondary | ICD-10-CM

## 2016-07-19 MED ORDER — ONDANSETRON HCL 8 MG PO TABS: 8.0000 mg | ORAL_TABLET | Freq: Two times a day (BID) | ORAL | 1 refills | Status: DC | PRN

## 2016-07-19 NOTE — Patient Instructions (Signed)
Will order PET scan for staging-office will call with appointment after authorization obtained Try Zofran 8 mg twice daily as needed for nausea Try OTC ExLax for constipation Return to office on 07/28/16 at 0830 to discuss PET results

## 2016-07-19 NOTE — Progress Notes (Signed)
[facility] Patient Consult   Referring MD: Kenia Deperalta 75 y.o.  08-Nov-1941    Reason for Referral: Adenocarcinoma involving a liver lesion   HPI: [name] reports right abdominal pain for the past 5 weeks. She was referred for a CT of the abdomen and pelvis on 06/17/2016. A 1.9 cm cystic lesion was noted in the pancreas head. A 3.4 cm hypodensity was noted in the inferior right hepatic lobe.  Dr. Osborne Casco obtained an MRI of the abdomen on 06/20/2016. A well-circumscribed lesion was noted in the right hepatic lobe. No biliary duct dilatation. A cystic lesion in the pancreas measured 16 x 9 mm. No postcontrast enhancement. No comedo indication with pancreatic duct. The pancreatic duct appeared normal. The adrenal glands and kidneys appeared normal. No retroperitoneal or periportal lymphadenopathy. The liver lesion was felt to most likely represent a metastasis. The cystic lesion in the pancreas was felt to most likely be benign. She was referred to Dr.Magod. He recommended a biopsy the liver lesion.   She underwent ultrasound-guided biopsy of the right liver lesion on 07/10/2016 [id number]) The pathology confirmed adenocarcinoma.  She reports developing pain in the left lower leg and right foot over the past week. A Doppler of the left leg on 07/14/2016 revealed no evidence of venous thrombosis.  She reports the left leg pain has improved. She has persistent pain at the right foot. She had bilateral lower extremity "superficial phlebitis "one year ago.    Past Medical History:  Diagnosis Date  . High cholesterol   . Hypertension   . IBS (irritable bowel syndrome)     .  G3 P3   .  Lower extremity "phlebitis "in 2016   .  Negative colonoscopy in 2013  Past Surgical History:  Procedure Laterality Date  . BREAST SURGERY Right     Benign  . CHOLECYSTECTOMY    . TOTAL ABDOMINAL HYSTERECTOMY      Medications: Reviewed  Allergies:    Allergies  Allergen Reactions  . Pneumococcal Vaccines Swelling and Other (See Comments)    Fever  . Codeine Other (See Comments)    Looses balance and feels light headed    Family history: Her sister had breast cancer in her 49s and died of lung cancer in her 30s. She was a smoker. A brother had esophagus cancer. She had 4 brothers and 3 sisters. She has 3 daughters. No other family history of cancer.   Social History:   She lives with her husband. She previously worked in a Aeronautical engineer and for [employer] She does not use cigarettes. No alcohol use. No risk factor for HIV or hepatitis.     ROS:   Positives include:Right low abdominal pain for 5 weeks, anorexia, 11 pound weight loss, intermittent nausea-no vomiting, low-grade fever for a few days, constipation, pain at the left lower leg and right foot  A complete ROS was otherwise negative.  Physical Exam:  Blood pressure (!) 152/65, pulse 97, temperature 98.4 F (36.9 C), temperature source Oral, resp. rate 18, height 5\' 5"  (1.651 m), weight 145 lb 8 oz (66 kg), SpO2 99 %.  HEENT: Upper and lower denture plate, oropharynx without visible mass, neck without mass  Lungs: Clear bilaterally  Cardiac: Regular rate and rhythm  Abdomen: No hepatosplenomegaly, no mass, no apparent ascites, tender in the right mid and lower abdomen  Vascular: No leg edema or erythema, no palpable cord  Lymph nodes: No cervical, supra-clavicular,  axillary, or inguinal nodes  Neurologic: Alert and oriented, the motor exam appears intact in the upper and lower extremities  Skin: No rash  Musculoskeletal: Tender at the proximal medial right foot with induration. No discrete mass, fluctuance, or palpable cord. No spine tenderness. Breast: Bilateral breast without mass  LAB:  CBC  Lab Results  Component Value Date   WBC 4.7 07/10/2016   HGB 12.8 07/10/2016   HCT 38.7 07/10/2016   MCV 89.0 07/10/2016   PLT 203 07/10/2016   NEUTROABS 2.4  06/17/2016     CMP      Component Value Date/Time   NA 140 06/17/2016 1443   K 3.7 06/17/2016 1443   CL 106 06/17/2016 1443   CO2 24 06/17/2016 1443   GLUCOSE 113 (H) 06/17/2016 1443   BUN 14 06/17/2016 1443   CREATININE 0.63 06/17/2016 1443   CREATININE 0.72 07/27/2014 1218   CALCIUM 9.7 06/17/2016 1443   PROT 7.8 06/17/2016 1443   ALBUMIN 4.7 06/17/2016 1443   AST 19 06/17/2016 1443   ALT 14 06/17/2016 1443   ALKPHOS 88 06/17/2016 1443   BILITOT 0.6 06/17/2016 1443   GFRNONAA >60 06/17/2016 1443   GFRAA >60 06/17/2016 1443    07/13/2016: AFP-5.3,  06/21/2016: CEA-0.7, CA 19-9-14   Imaging: CT abdomen/pelvis 06/17/2016-images reviewed   Assessment/Plan:   1. Right liver mass-biopsy 07/10/2016 confirmed adenocarcinoma  CT abdomen/pelvis 06/17/2016 and MRI abdomen 06/21/2016 confirmed an isolated right liver mass and a cystic pancreas lesion 2.   Left leg and right foot pain  Negative left leg Doppler 07/14/2016  3.   Right abdominal pain  4.   Anorexia/weight loss  5.   Benign appearing 16 mm cystic uncinate pancreas lesion noted on MRI 06/20/2016    Disposition:   [name] has been diagnosed with adenocarcinoma on biopsy of an isolated right liver mass.  There is no obvious primary tumor site on review of her history, physical examination, or imaging studies to date. The differential diagnosis includes a primary cholangiocarcinoma and metastatic adenocarcinoma from another primary tumor site.  Serum tumor markers including a CEA, AFP, and CA 19-9 returned normal.  I discussed the differential diagnosis with [name] and her daughter.   She will be referred for a staging PET scan to look for evidence of a primary tumor site and assess for bone metastases.  The induration and pain/tenderness at the right foot could be related to a bone metastasis. I have a low clinical suspicion for superficial phlebitis.  [name] will return for an office  visit and further discussion after the PET scan. We will consider additional imaging of his chemistry stains and a gene array assay pending the PET scan findings.  She will continue tramadol as needed for pain. We gave her per prescription for Zofran to use for nausea. She will use MiraLAX as needed for constipation.  Approximately 50 minutes were spent with the patient today. The majority of the time was used for counseling and coordination of care.  [name], MD  07/19/2016, 12:15 PM

## 2016-07-19 NOTE — Telephone Encounter (Signed)
Gave pt cal & avs °

## 2016-07-19 NOTE — Progress Notes (Signed)
Oncology Nurse Navigator Documentation  Oncology Nurse Navigator Flowsheets 07/19/2016  Navigator Location [facility]  Navigator Encounter Type Initial MedOnc  Abnormal Finding Date -  Confirmed Diagnosis Date -  Patient Visit Type MedOnc;Initial  Treatment Phase Pre-Tx/Tx Discussion  Barriers/Navigation Needs Education;Coordination of Care--PET scan  Education Understanding Cancer/ Treatment Options;Pain/ Symptom Management;Newly Diagnosed Cancer Education  Interventions Coordination of Care;Education Method  Coordination of Care Radiology  Education Method Verbal;Written;Teach-back  Support Groups/Services GI Support Group;[place] services  Acuity Level 2  Time Spent with Patient 65  Met with patient and daughter, [name] (is an Therapist, sports and lives in [place])  during new patient visit. Explained the role of the GI Nurse Navigator and provided New Patient Packet with information on: 1. Support groups 2. Advanced Directives 3. Fall Safety Plan Answered questions, reviewed current treatment plan using TEACH back and provided emotional support. Definitive diagnosis (primary site) is undetermined at this time. Will schedule PET scan in next week (daughter requests 8/2-her day off). Message sent to managed care to check on authorization. Kathyjo lives with her husband, who has advanced prostate cancer and is his caregiver. He can't be left alone, so family helps when she needs to get out. Currently she has pain in right ankle and left lower leg with ambulation-gait is unsteady (she was in w/c in exam room). Encourage her to use the walker she has in the home until the pain is better and her gait improves. She will try new script for zofran for her nausea and ExLax (her choice) for constipation. Escorted patient to scheduling.  Merceda Elks, RN, BSN GI Oncology [place]

## 2016-07-24 ENCOUNTER — Telehealth: Payer: Self-pay | Admitting: *Deleted

## 2016-07-24 NOTE — Telephone Encounter (Signed)
Due to insurance company required 3 business days to approve PET, it has been scheduled for 07/28/16 at 2:00pm. Arrive at 1:30 pm and NPO 6 hours prior. Notified patient via home VM with message that RN will call her back regarding the potential reschedule of her office visit that morning. MD notified.

## 2016-07-24 NOTE — Telephone Encounter (Signed)
Message from pt and daughter following up on PET appt. They have been in contact with insurance company, and were given this information to forward to our office: phone# [phone] ref# [id number]. Forwarded info to managed care dept. Office staff has submitted request to [employer].

## 2016-07-24 NOTE — Telephone Encounter (Signed)
Called patient back and made her aware that MD visit has been rescheduled to 08/03/16 at 11:30 since her PET is after her visit on 8/4. Will also move her case presentation to 8/9 for tumor board review. She will let her daughter, [name] know about the reschedule and she will call back if this will not fit her work schedule. Daughter is Marine scientist and works in [place]. Patient requests that her daughter be called with all results and she will interpret for her.

## 2016-07-25 DIAGNOSIS — C801 Malignant (primary) neoplasm, unspecified: Secondary | ICD-10-CM | POA: Diagnosis not present

## 2016-07-25 DIAGNOSIS — I10 Essential (primary) hypertension: Secondary | ICD-10-CM | POA: Diagnosis not present

## 2016-07-25 DIAGNOSIS — Z6823 Body mass index (BMI) 23.0-23.9, adult: Secondary | ICD-10-CM | POA: Diagnosis not present

## 2016-07-25 DIAGNOSIS — F430 Acute stress reaction: Secondary | ICD-10-CM | POA: Diagnosis not present

## 2016-07-25 DIAGNOSIS — I5189 Other ill-defined heart diseases: Secondary | ICD-10-CM | POA: Diagnosis not present

## 2016-07-25 DIAGNOSIS — I517 Cardiomegaly: Secondary | ICD-10-CM | POA: Diagnosis not present

## 2016-07-25 DIAGNOSIS — R7302 Impaired glucose tolerance (oral): Secondary | ICD-10-CM | POA: Diagnosis not present

## 2016-07-25 DIAGNOSIS — D692 Other nonthrombocytopenic purpura: Secondary | ICD-10-CM | POA: Diagnosis not present

## 2016-07-25 DIAGNOSIS — E7800 Pure hypercholesterolemia, unspecified: Secondary | ICD-10-CM | POA: Diagnosis not present

## 2016-07-28 ENCOUNTER — Ambulatory Visit (HOSPITAL_COMMUNITY)
Admission: RE | Admit: 2016-07-28 | Discharge: 2016-07-28 | Disposition: A | Discharge status: Home / Self Care | Payer: PPO | Source: Ambulatory Visit | Attending: Oncology | Admitting: Oncology

## 2016-07-28 ENCOUNTER — Ambulatory Visit: Payer: PPO | Admitting: Physical Therapy

## 2016-07-28 ENCOUNTER — Ambulatory Visit: Payer: PPO | Admitting: Oncology

## 2016-07-28 DIAGNOSIS — C801 Malignant (primary) neoplasm, unspecified: Secondary | ICD-10-CM

## 2016-07-28 DIAGNOSIS — K769 Liver disease, unspecified: Secondary | ICD-10-CM | POA: Diagnosis not present

## 2016-07-28 DIAGNOSIS — R935 Abnormal findings on diagnostic imaging of other abdominal regions, including retroperitoneum: Secondary | ICD-10-CM | POA: Insufficient documentation

## 2016-07-28 DIAGNOSIS — D499 Neoplasm of unspecified behavior of unspecified site: Secondary | ICD-10-CM | POA: Diagnosis not present

## 2016-07-28 LAB — GLUCOSE, CAPILLARY: GLUCOSE-CAPILLARY: 117 mg/dL — AB (ref 65–99)

## 2016-07-28 MED ORDER — FLUDEOXYGLUCOSE F - 18 (FDG) INJECTION
7.7500 | Freq: Once | INTRAVENOUS | Status: AC | PRN
  Administered 2016-07-28: 7.75 via INTRAVENOUS

## 2016-07-31 ENCOUNTER — Telehealth: Payer: Self-pay | Admitting: *Deleted

## 2016-07-31 NOTE — Telephone Encounter (Signed)
2 messages from pt's daughter [name] requesting PET results. Informed her that MD is out of office today, will call with results on 8/8. She is requesting to move office visit to 8/9 if possible. Offered 8/8 visit, [name] has to work that day. She reiterated pt does not wish to be called, asks that we call [name] with results.

## 2016-08-01 NOTE — Telephone Encounter (Signed)
Ok to move appt. To 12:15 ON 8/9  Pet Shows uptake in stomach suggestive of primary stomach tumor, needs endoscopy with Dr. Watt Climes to confirm diagnosis  I will contact him 8/9  We can move appt. To aftre EGD if she prefers

## 2016-08-02 ENCOUNTER — Telehealth: Payer: Self-pay | Admitting: *Deleted

## 2016-08-02 NOTE — Telephone Encounter (Signed)
Oncology Nurse Navigator Documentation  Oncology Nurse Navigator Flowsheets 08/02/2016  Navigator Location [facility]  Navigator Encounter Type Telephone  Telephone Outgoing Call;Appt Confirmation/Clarification;Patient Update  Abnormal Finding Date -  Confirmed Diagnosis Date -  Patient Visit Type -  Treatment Phase Pre-Tx/Tx Discussion  Barriers/Navigation Needs -  Education -  Interventions Coordination of Care  Coordination of Care Appts--rescheduled her appointment with Dr. Benay Spice for 08/08/16 at 3:30 pm (daughter has day off).  Education Method -  Support Groups/Services -  Acuity -  Time Spent with Patient 36  Made daughter aware that per PET scan the uptake suggests she has a primary stomach cancer. Dr. Perley Jain office will be calling her to do an EGD this week. She will call navigator when they have heard from GI office. Patient had asked for pathology report from collaborative nurse. Informed daughter that we will provide her with the path report from her liver biopsy and EGD at her 8/15 visit unless she needs it sooner.

## 2016-08-03 ENCOUNTER — Ambulatory Visit: Payer: PPO | Admitting: Oncology

## 2016-08-03 ENCOUNTER — Other Ambulatory Visit: Payer: Self-pay | Admitting: Gastroenterology

## 2016-08-03 DIAGNOSIS — K3189 Other diseases of stomach and duodenum: Secondary | ICD-10-CM | POA: Diagnosis not present

## 2016-08-03 DIAGNOSIS — R933 Abnormal findings on diagnostic imaging of other parts of digestive tract: Secondary | ICD-10-CM | POA: Diagnosis not present

## 2016-08-03 DIAGNOSIS — K259 Gastric ulcer, unspecified as acute or chronic, without hemorrhage or perforation: Secondary | ICD-10-CM | POA: Diagnosis not present

## 2016-08-03 DIAGNOSIS — K449 Diaphragmatic hernia without obstruction or gangrene: Secondary | ICD-10-CM | POA: Diagnosis not present

## 2016-08-03 DIAGNOSIS — K228 Other specified diseases of esophagus: Secondary | ICD-10-CM | POA: Diagnosis not present

## 2016-08-03 DIAGNOSIS — K2950 Unspecified chronic gastritis without bleeding: Secondary | ICD-10-CM | POA: Diagnosis not present

## 2016-08-08 ENCOUNTER — Encounter: Payer: Self-pay | Admitting: *Deleted

## 2016-08-08 ENCOUNTER — Ambulatory Visit (HOSPITAL_BASED_OUTPATIENT_CLINIC_OR_DEPARTMENT_OTHER): Payer: PPO | Admitting: Oncology

## 2016-08-08 VITALS — BP 157/64 | HR 75 | Temp 98.2°F | Resp 17 | Ht 65.0 in | Wt 140.6 lb

## 2016-08-08 DIAGNOSIS — C787 Secondary malignant neoplasm of liver and intrahepatic bile duct: Secondary | ICD-10-CM

## 2016-08-08 DIAGNOSIS — R109 Unspecified abdominal pain: Secondary | ICD-10-CM | POA: Diagnosis not present

## 2016-08-08 DIAGNOSIS — C801 Malignant (primary) neoplasm, unspecified: Secondary | ICD-10-CM | POA: Diagnosis not present

## 2016-08-08 DIAGNOSIS — R634 Abnormal weight loss: Secondary | ICD-10-CM | POA: Diagnosis not present

## 2016-08-08 NOTE — Progress Notes (Signed)
  Laura OFFICE PROGRESS NOTE   Diagnosis: Liver lesion-adenocarcinoma  INTERVAL HISTORY:   Laura [name] returns as scheduled. She has intermittent discomfort in the mid upper abdomen near the xiphoid. No consistent pain. She relates weight loss to anxiety and caring for her husband. A PET scan on 07/28/2016 revealed no hypermetabolism at the area of the liver lesion. No other liver lesion. An area of hypermetabolism was noted in the pyloric region of the stomach with associated wall thickening. Review of the previous CT and MRI suggested an enhancing lesion in this area.  She saw Dr. Watt Climes and was taken to an endoscopy procedure on 08/03/2016. Patchy mild inflammation was noted in the stomach. A few nonbleeding erosions were found in the gastric antrum. Biopsies were taken. No evidence of malignancy. There were changes in the esophagus suspicious for a short segment of Barrett's. Biopsies from the gastric antrum returned as benign gastric mucosa.   Objective:  Vital signs in last 24 hours:  Blood pressure (!) 157/64, pulse 75, temperature 98.2 F (36.8 C), temperature source Oral, resp. rate 17, height 5\' 5"  (1.651 m), weight 140 lb 9.6 oz (63.8 kg), SpO2 100 %.    Resp: Inspiratory wheeze at the left upper posterior chest that cleared after several respirations, no respiratory distress Cardio: Regular rate and rhythm GI: No hepatosplenomegaly, no mass, mild tenderness in the subxiphoid region and at the xiphoid. No mass. Vascular: No leg edema   Lab Results:  Lab Results  Component Value Date   WBC 4.7 07/10/2016   HGB 12.8 07/10/2016   HCT 38.7 07/10/2016   MCV 89.0 07/10/2016   PLT 203 07/10/2016   NEUTROABS 2.4 06/17/2016     Medications: I have reviewed the patient's current medications.  Assessment/Plan: 1. Right liver mass-biopsy 07/10/2016 confirmed adenocarcinoma  CT abdomen/pelvis 06/17/2016 and MRI abdomen 06/21/2016 confirmed an isolated  right liver mass and a cystic pancreas lesion  PET scan 07/28/2016-the hepatic lesion is not hypermetabolic, hypermetabolic lesion in the gastric antrum suspicious for a primary neoplasm almond no other evidence of metastatic disease  Upper endoscopy 08/03/2016-no mass found, biopsy from the gastric antrum-benign 2.   Left leg and right foot pain  Negative left leg Doppler 07/14/2016  3.   Right abdominal pain  4.   Anorexia/weight loss  5.   Benign appearing 16 mm cystic uncinate pancreas lesion noted on MRI 06/20/2016      Disposition:  Laura [name] appears stable, though she has lost weight over the past few months. She relates the weight loss to anxiety regarding the cancer diagnosis and caring for her husband. I doubt the subxiphoid discomfort is related to the liver lesion.  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 I will present her case at the GI tumor conference on 08/09/2016 to discuss treatment options. We discussed the possibility of surgical resection, ablation, SRS, and observation with Laura [name] and her family.  She will return for an office visit here in 2 months. We will make a surgical referral as indicated based on the GI tumor conference discussion.    [name], MD  08/08/2016  4:10 PM

## 2016-08-08 NOTE — Progress Notes (Signed)
Oncology Nurse Navigator Documentation  Oncology Nurse Navigator Flowsheets 08/08/2016  Navigator Location [facility]  Navigator Encounter Type Follow-up Appt  Telephone -  Abnormal Finding Date -  Confirmed Diagnosis Date -  Patient Visit Type MedOnc  Treatment Phase Pre-Tx/Tx Discussion  Barriers/Navigation Needs Education-  Education Actor Options;Other--caring for herself and eating more intentionally and let some things go. She is caregiver for her husband who has significant heart problems and dementia.   Interventions Education Method  Coordination of Care -  Education Method Verbal  Support Groups/Services -  Acuity Level 2  Time Spent with Patient 30  Case will be discussed in tumor board tomorrow and we will call daughter with consensus in regards to suggested tx approach for her liver lesion.

## 2016-08-08 NOTE — Patient Instructions (Signed)
Recent pathology was not diagnostic-most likely diagnosis is an intrahepatic cholangiocarcinoma that seems to be fairly indolent. Options for treatment: 1. Surgery to remove 2.Radiofrequency ablation per interventional radiology 3 External beam radiation 4. Observation only with repeat CT scan in 4-6 months 5. Systemic therapy-chemotherapy with limited chance of response We are discussing your case in tumor board tomorrow and will call your daughter with consensus Work on your diet to gain weight: increase protein and calories  Take Care!

## 2016-08-09 ENCOUNTER — Telehealth: Payer: Self-pay | Admitting: *Deleted

## 2016-08-09 DIAGNOSIS — R160 Hepatomegaly, not elsewhere classified: Secondary | ICD-10-CM

## 2016-08-09 NOTE — Addendum Note (Signed)
Addended by: Tania Ade on: 08/09/2016 10:17 AM   Modules accepted: Orders

## 2016-08-09 NOTE — Telephone Encounter (Signed)
Return call from [facility] [place]: They do want to see Dr. Barry Dienes to discuss the surgical option, however she is working this Friday and won't be able to come for clinic appointment. Informed her navigator will send referral to [facility] today. Daughter adds that if she is called today about the appointment she can be reached at [phone] till 7 pm. Referral sent and records faxed.

## 2016-08-09 NOTE — Telephone Encounter (Signed)
Case discussed in tumor board today and she was determined to be good surgical candidate as well as good ablation candidate. Dr. Barry Dienes willing to see her on 8/17 in clinic to discuss. Daughter requested she be called when in office yesterday, but unable to reach her and she has no voice mail. Called patient and she reports [name] is at work, but when she sees that she missed a call [facility] she will call back.

## 2016-08-11 ENCOUNTER — Telehealth: Payer: Self-pay | Admitting: Oncology

## 2016-08-11 NOTE — Telephone Encounter (Signed)
Appointment conf with patient.

## 2016-08-21 DIAGNOSIS — C221 Intrahepatic bile duct carcinoma: Secondary | ICD-10-CM | POA: Diagnosis not present

## 2016-08-22 ENCOUNTER — Telehealth: Payer: Self-pay | Admitting: Radiation Oncology

## 2016-08-22 ENCOUNTER — Other Ambulatory Visit: Payer: Self-pay | Admitting: General Surgery

## 2016-08-22 DIAGNOSIS — R160 Hepatomegaly, not elsewhere classified: Secondary | ICD-10-CM

## 2016-08-22 NOTE — Telephone Encounter (Signed)
Spoke with Laura [name], she advised she wanted to go a different route but if it doesn't work out she will call back to reschedule appointment

## 2016-09-06 ENCOUNTER — Institutional Professional Consult (permissible substitution): Payer: PPO | Admitting: Radiation Oncology

## 2016-09-06 ENCOUNTER — Ambulatory Visit: Payer: PPO

## 2016-09-13 ENCOUNTER — Other Ambulatory Visit: Payer: PPO

## 2016-09-20 ENCOUNTER — Other Ambulatory Visit: Payer: PPO

## 2016-09-21 ENCOUNTER — Other Ambulatory Visit: Payer: PPO

## 2016-09-23 DIAGNOSIS — Z23 Encounter for immunization: Secondary | ICD-10-CM | POA: Diagnosis not present

## 2016-09-27 ENCOUNTER — Ambulatory Visit
Admission: RE | Admit: 2016-09-27 | Discharge: 2016-09-27 | Disposition: A | Discharge status: Home / Self Care | Payer: PPO | Source: Ambulatory Visit | Attending: General Surgery | Admitting: General Surgery

## 2016-09-27 ENCOUNTER — Ambulatory Visit
Admission: RE | Admit: 2016-09-27 | Discharge: 2016-09-27 | Disposition: A | Discharge status: Home / Self Care | Payer: PPO | Source: Ambulatory Visit | Attending: Internal Medicine | Admitting: Internal Medicine

## 2016-09-27 DIAGNOSIS — C221 Intrahepatic bile duct carcinoma: Secondary | ICD-10-CM | POA: Diagnosis not present

## 2016-09-27 DIAGNOSIS — Z1231 Encounter for screening mammogram for malignant neoplasm of breast: Secondary | ICD-10-CM

## 2016-09-27 DIAGNOSIS — R160 Hepatomegaly, not elsewhere classified: Secondary | ICD-10-CM

## 2016-09-27 HISTORY — PX: IR GENERIC HISTORICAL: IMG1180011

## 2016-09-27 NOTE — Consult Note (Signed)
Chief Complaint:  Solitary right liver mass, biopsy demonstrated adenocarcinoma. Evaluate for liver directed therapy.  Referring Physician(s): Byerly,Faera  History of Present Illness: Laura [name] is a 75 y.o. female who presents with a solitary right liver mass. Lesion was recently biopsied with ultrasound guidance demonstrated adenocarcinoma, favoring a pancreaticobiliary primary. Lesion is compatible with an intrahepatic cholangiocarcinoma versus a adenocarcinoma of unknown primary. Additional imaging workup does not confirm a primary source. PET imaging demonstrated no significant activity within the hepatic tumor. Endoscopy was negative for GI malignancy. Her presumptive diagnosis is intrahepatic cholangiocarcinoma.  She remains asymptomatic. No significant abdominal pain or flank pain. No signs of jaundice. No interval fevers. Stable weight and appetite.  Past Medical History:  Diagnosis Date  . High cholesterol   . Hypertension   . IBS (irritable bowel syndrome)     Past Surgical History:  Procedure Laterality Date  . BREAST SURGERY     Benign  . CHOLECYSTECTOMY    . TOTAL ABDOMINAL HYSTERECTOMY      Allergies: Pneumococcal vaccines and Codeine  Medications: Prior to Admission medications   Medication Sig Start Date End Date Taking? Authorizing Provider  amLODipine (NORVASC) 5 MG tablet Take 1 tablet by mouth daily. 06/22/14  Yes Historical Provider, MD  aspirin EC 81 MG tablet Take 81 mg by mouth daily.   Yes Historical Provider, MD  Cholecalciferol (VITAMIN D PO) Take 1 tablet by mouth daily. 500iu   Yes Historical Provider, MD  clonazePAM (KLONOPIN) 0.5 MG tablet Take 0.25-0.5 mg by mouth 2 (two) times daily as needed for anxiety.    Yes Historical Provider, MD  doxazosin (CARDURA) 1 MG tablet Take 1 tablet by mouth at bedtime. 06/22/14  Yes Historical Provider, MD  ibuprofen (ADVIL,MOTRIN) 200 MG tablet Take 200-600 mg by mouth every 6 (six) hours as  needed. Headache or restless leg pain   Yes Historical Provider, MD  ondansetron (ZOFRAN) 8 MG tablet Take 1 tablet (8 mg total) by mouth 2 (two) times daily as needed for nausea or vomiting. 07/19/16  Yes Ladell Pier, MD  pravastatin (PRAVACHOL) 40 MG tablet Take 40 mg by mouth at bedtime.   Yes Historical Provider, MD  tetrahydrozoline 0.05 % ophthalmic solution Place 1 drop into both eyes 2 (two) times daily as needed (dry eye).   Yes Historical Provider, MD  traMADol (ULTRAM) 50 MG tablet Take 1 tablet (50 mg total) by mouth every 6 (six) hours as needed for severe pain. 06/17/16  Yes Christopher Lawyer, PA-C  ondansetron (ZOFRAN) 8 MG tablet Take 1 tablet (8 mg total) by mouth 2 (two) times daily as needed for nausea or vomiting. 07/19/16   Ladell Pier, MD     Family History  Problem Relation Age of Onset  . Alzheimer's disease Mother   . Alzheimer's disease Brother   . Alzheimer's disease Sister   . CAD Brother 15    CABG  . Cancer Sister     Breast  . Cancer Brother     Lung    Social History   Social History  . Marital status: Married    Spouse name: Lanae Boast  . Number of children: 3  . Years of education: N/A   Occupational History  . Retired    Social History Main Topics  . Smoking status: Never Smoker  . Smokeless tobacco: Not on file  . Alcohol use No  . Drug use: No  . Sexual activity: Not on  file   Other Topics Concern  . Not on file   Social History Narrative   Lives at home with husband, Lanae Boast who is in poor health (she is caregiver)   Has #3 daughters   Retired from working at [employer]   Has total of #7 siblings    ECOG Status: 1 - Symptomatic but completely ambulatory  Review of Systems: A 12 point ROS discussed and pertinent positives are indicated in the HPI above.  All other systems are negative.  Review of Systems  Vital Signs: BP 139/61 (BP Location: Left Arm, Patient Position: Sitting, Cuff Size: Normal)   Pulse 64    Temp 98.2 F (36.8 C) (Oral)   Resp 15   Ht 5\' 5"  (1.651 m)   Wt 135 lb (61.2 kg)   SpO2 98%   BMI 22.47 kg/m   Physical Exam  Constitutional: She is oriented to person, place, and time. She appears well-developed and well-nourished. No distress.  Pleasant elderly female in no distress.  Cardiovascular: Normal rate and regular rhythm.  Exam reveals no friction rub.   No murmur heard. Pulmonary/Chest: Effort normal and breath sounds normal. No respiratory distress. She has no wheezes.  Abdominal: Soft. Bowel sounds are normal. She exhibits no distension and no mass. There is no tenderness. There is no guarding.  Neurological: She is alert and oriented to person, place, and time.  Skin: Skin is dry. No rash noted. She is not diaphoretic. No erythema.  Psychiatric: She has a normal mood and affect. Her behavior is normal.      Imaging: No results found.  Labs:  CBC:  Recent Labs  06/17/16 1443 07/10/16 1210  WBC 3.9* 4.7  HGB 12.9 12.8  HCT 37.8 38.7  PLT 203 203    COAGS:  Recent Labs  07/10/16 1210  INR 1.01  APTT 27    BMP:  Recent Labs  06/17/16 1443  NA 140  K 3.7  CL 106  CO2 24  GLUCOSE 113*  BUN 14  CALCIUM 9.7  CREATININE 0.63  GFRNONAA >60  GFRAA >60    LIVER FUNCTION TESTS:  Recent Labs  06/17/16 1443  BILITOT 0.6  AST 19  ALT 14  ALKPHOS 88  PROT 7.8  ALBUMIN 4.7    TUMOR MARKERS: No results for input(s): AFPTM, CEA, CA199, CHROMGRNA in the last 8760 hours.  Assessment and Plan:  Solitary liver mass in the right hepatic lobe (segment 6) measuring 3.5 x 2.7 cm by MRI. Biopsy revealed adenocarcinoma, suspect pancreaticobiliary primary. 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 No primary source could be demonstrated on additional imaging and endoscopic workup. She presents to review liver directed therapy including microwave ablation. Based on lesion size and location, the lesion would be amenable to image  guided microwave ablation under general anesthesia. The procedure, risks, benefits and alternatives were reviewed. After our discussion, she would like to proceed with ablation. This can be scheduled at [facility] next few weeks.  Plan: Image guided microwave ablation of the inferior right hepatic mass in segment 6. Of note, because of the lesion location and proximity of the hepatic flexure this may require hydrodissection to protect the colon from heat injury.  Thank you for this interesting consult.  I greatly enjoyed meeting Laura [name] and look forward to participating in their care.  A copy of this report was sent to the requesting provider on this date.  Electronically Signed: Greggory Keen 09/27/2016, 4:00 PM  I spent a total of  30 Minutes   in face to face in clinical consultation, greater than 50% of which was counseling/coordinating care for this patient with intrahepatic cholangiocarcinoma.

## 2016-09-28 ENCOUNTER — Telehealth: Payer: Self-pay | Admitting: Oncology

## 2016-09-28 NOTE — Telephone Encounter (Signed)
10/16 Appointment canceled per patient request.

## 2016-10-02 ENCOUNTER — Other Ambulatory Visit (HOSPITAL_COMMUNITY): Payer: Self-pay | Admitting: Interventional Radiology

## 2016-10-02 DIAGNOSIS — R160 Hepatomegaly, not elsewhere classified: Secondary | ICD-10-CM

## 2016-10-03 NOTE — Progress Notes (Signed)
Procedure scheduled on 11/03/16.  Need orders in EPIC.  Thank You.

## 2016-10-06 DIAGNOSIS — H2513 Age-related nuclear cataract, bilateral: Secondary | ICD-10-CM | POA: Diagnosis not present

## 2016-10-06 DIAGNOSIS — H524 Presbyopia: Secondary | ICD-10-CM | POA: Diagnosis not present

## 2016-10-09 ENCOUNTER — Ambulatory Visit: Payer: PPO | Admitting: Oncology

## 2016-10-10 ENCOUNTER — Other Ambulatory Visit: Payer: Self-pay | Admitting: Interventional Radiology

## 2016-10-10 DIAGNOSIS — C221 Intrahepatic bile duct carcinoma: Secondary | ICD-10-CM

## 2016-10-30 ENCOUNTER — Other Ambulatory Visit: Payer: Self-pay | Admitting: Radiology

## 2016-10-30 NOTE — Patient Instructions (Signed)
SRITHA SNYDER  10/30/2016   Your procedure is scheduled on: 11/03/2016    Report to [facility] Main  Entrance take [place]  elevators to 3rd floor to  [place] at   [place] AM.  Call this number if you have problems the morning of surgery [phone]   Remember: ONLY 1 PERSON MAY GO WITH YOU TO SHORT STAY TO GET  READY MORNING OF YOUR SURGERY.  Do not eat food or drink liquids :After Midnight.     Take these medicines the morning of surgery with A SIP OF WATER:  Amlodipine ( NOrvasc), Klonopin( Clonazepam), Prilosec                                 You may not have any metal on your body including hair pins and              piercings  Do not wear jewelry, make-up, lotions, powders or perfumes, deodorant             Do not wear nail polish.  Do not shave  48 hours prior to surgery.     Do not bring valuables to the hospital. [place].  Contacts, dentures or bridgework may not be worn into surgery.  Leave suitcase in the car. After surgery it may be brought to your room.       Special Instructions: N/A              Please read over the following fact sheets you were given: _____________________________________________________________________             [facility] - Preparing for Surgery Before surgery, you can play an important role.  Because skin is not sterile, your skin needs to be as free of germs as possible.  You can reduce the number of germs on your skin by washing with CHG (chlorahexidine gluconate) soap before surgery.  CHG is an antiseptic cleaner which kills germs and bonds with the skin to continue killing germs even after washing. Please DO NOT use if you have an allergy to CHG or antibacterial soaps.  If your skin becomes reddened/irritated stop using the CHG and inform your nurse when you arrive at Short Stay. Do not shave (including legs and underarms) for at least 48 hours prior to  the first CHG shower.  You may shave your face/neck. Please follow these instructions carefully:  1.  Shower with CHG Soap the night before surgery and the  morning of Surgery.  2.  If you choose to wash your hair, wash your hair first as usual with your  normal  shampoo.  3.  After you shampoo, rinse your hair and body thoroughly to remove the  shampoo.                           4.  Use CHG as you would any other liquid soap.  You can apply chg directly  to the skin and wash                       Gently with a scrungie or clean washcloth.  5.  Apply the CHG Soap to  your body ONLY FROM THE NECK DOWN.   Do not use on face/ open                           Wound or open sores. Avoid contact with eyes, ears mouth and genitals (private parts).                       Wash face,  Genitals (private parts) with your normal soap.             6.  Wash thoroughly, paying special attention to the area where your surgery  will be performed.  7.  Thoroughly rinse your body with warm water from the neck down.  8.  DO NOT shower/wash with your normal soap after using and rinsing off  the CHG Soap.                9.  Pat yourself dry with a clean towel.            10.  Wear clean pajamas.            11.  Place clean sheets on your bed the night of your first shower and do not  sleep with pets. Day of Surgery : Do not apply any lotions/deodorants the morning of surgery.  Please wear clean clothes to the hospital/surgery center.  FAILURE TO FOLLOW THESE INSTRUCTIONS MAY RESULT IN THE CANCELLATION OF YOUR SURGERY PATIENT SIGNATURE_________________________________  NURSE SIGNATURE__________________________________  ________________________________________________________________________  WHAT IS A BLOOD TRANSFUSION? Blood Transfusion Information  A transfusion is the replacement of blood or some of its parts. Blood is made up of multiple cells which provide different functions.  Red blood cells carry oxygen and  are used for blood loss replacement.  White blood cells fight against infection.  Platelets control bleeding.  Plasma helps clot blood.  Other blood products are available for specialized needs, such as hemophilia or other clotting disorders. BEFORE THE TRANSFUSION  Who gives blood for transfusions?   Healthy volunteers who are fully evaluated to make sure their blood is safe. This is blood bank blood. Transfusion therapy is the safest it has ever been in the practice of medicine. Before blood is taken from a donor, a complete history is taken to make sure that person has no history of diseases nor engages in risky social behavior (examples are intravenous drug use or sexual activity with multiple partners). The donor's travel history is screened to minimize risk of transmitting infections, such as malaria. The donated blood is tested for signs of infectious diseases, such as HIV and hepatitis. The blood is then tested to be sure it is compatible with you in order to minimize the chance of a transfusion reaction. If you or a relative donates blood, this is often done in anticipation of surgery and is not appropriate for emergency situations. It takes many days to process the donated blood. RISKS AND COMPLICATIONS Although transfusion therapy is very safe and saves many lives, the main dangers of transfusion include:   Getting an infectious disease.  Developing a transfusion reaction. This is an allergic reaction to something in the blood you were given. Every precaution is taken to prevent this. The decision to have a blood transfusion has been considered carefully by your caregiver before blood is given. Blood is not given unless the benefits outweigh the risks. AFTER THE TRANSFUSION  Right after receiving a blood transfusion, you will usually  feel much better and more energetic. This is especially true if your red blood cells have gotten low (anemic). The transfusion raises the level of the  red blood cells which carry oxygen, and this usually causes an energy increase.  The nurse administering the transfusion will monitor you carefully for complications. HOME CARE INSTRUCTIONS  No special instructions are needed after a transfusion. You may find your energy is better. Speak with your caregiver about any limitations on activity for underlying diseases you may have. SEEK MEDICAL CARE IF:   Your condition is not improving after your transfusion.  You develop redness or irritation at the intravenous (IV) site. SEEK IMMEDIATE MEDICAL CARE IF:  Any of the following symptoms occur over the next 12 hours:  Shaking chills.  You have a temperature by mouth above 102 F (38.9 C), not controlled by medicine.  Chest, back, or muscle pain.  People around you feel you are not acting correctly or are confused.  Shortness of breath or difficulty breathing.  Dizziness and fainting.  You get a rash or develop hives.  You have a decrease in urine output.  Your urine turns a dark color or changes to pink, red, or brown. Any of the following symptoms occur over the next 10 days:  You have a temperature by mouth above 102 F (38.9 C), not controlled by medicine.  Shortness of breath.  Weakness after normal activity.  The white part of the eye turns yellow (jaundice).  You have a decrease in the amount of urine or are urinating less often.  Your urine turns a dark color or changes to pink, red, or brown. Document Released: 12/08/2000 Document Revised: 03/04/2012 Document Reviewed: 07/27/2008 [facility] Patient Information 2014 [place], [place].  _______________________________________________________________________

## 2016-10-31 NOTE — Progress Notes (Signed)
[place] on chart - 10/31/16 2V CX and EKG done 01/25/16 on chart

## 2016-11-01 ENCOUNTER — Encounter (HOSPITAL_COMMUNITY)
Admission: RE | Admit: 2016-11-01 | Discharge: 2016-11-01 | Disposition: A | Discharge status: Home / Self Care | Payer: PPO | Source: Ambulatory Visit | Attending: Interventional Radiology | Admitting: Interventional Radiology

## 2016-11-01 ENCOUNTER — Encounter (HOSPITAL_COMMUNITY): Payer: Self-pay

## 2016-11-01 ENCOUNTER — Ambulatory Visit (HOSPITAL_COMMUNITY)
Admission: RE | Admit: 2016-11-01 | Discharge: 2016-11-01 | Disposition: A | Discharge status: Home / Self Care | Payer: PPO | Source: Ambulatory Visit | Attending: Radiology | Admitting: Radiology

## 2016-11-01 DIAGNOSIS — Z01812 Encounter for preprocedural laboratory examination: Secondary | ICD-10-CM | POA: Diagnosis not present

## 2016-11-01 DIAGNOSIS — Z01818 Encounter for other preprocedural examination: Secondary | ICD-10-CM | POA: Insufficient documentation

## 2016-11-01 DIAGNOSIS — Z01810 Encounter for preprocedural cardiovascular examination: Secondary | ICD-10-CM | POA: Diagnosis not present

## 2016-11-01 HISTORY — DX: Other specified postprocedural states: Z98.890

## 2016-11-01 HISTORY — DX: Gastro-esophageal reflux disease without esophagitis: K21.9

## 2016-11-01 HISTORY — DX: Malignant (primary) neoplasm, unspecified: C80.1

## 2016-11-01 HISTORY — DX: Nausea with vomiting, unspecified: R11.2

## 2016-11-01 HISTORY — DX: Cardiac murmur, unspecified: R01.1

## 2016-11-01 LAB — CBC WITH DIFFERENTIAL/PLATELET
Basophils Absolute: 0 10*3/uL (ref 0.0–0.1)
Basophils Relative: 1 %
Eosinophils Absolute: 0 10*3/uL (ref 0.0–0.7)
Eosinophils Relative: 1 %
HEMATOCRIT: 36.8 % (ref 36.0–46.0)
HEMOGLOBIN: 12 g/dL (ref 12.0–15.0)
LYMPHS ABS: 1.1 10*3/uL (ref 0.7–4.0)
LYMPHS PCT: 28 %
MCH: 28.4 pg (ref 26.0–34.0)
MCHC: 32.6 g/dL (ref 30.0–36.0)
MCV: 87.2 fL (ref 78.0–100.0)
MONOS PCT: 12 %
Monocytes Absolute: 0.5 10*3/uL (ref 0.1–1.0)
NEUTROS ABS: 2.4 10*3/uL (ref 1.7–7.7)
NEUTROS PCT: 58 %
Platelets: 241 10*3/uL (ref 150–400)
RBC: 4.22 MIL/uL (ref 3.87–5.11)
RDW: 13.8 % (ref 11.5–15.5)
WBC: 4.1 10*3/uL (ref 4.0–10.5)

## 2016-11-01 LAB — COMPREHENSIVE METABOLIC PANEL
ALBUMIN: 4.2 g/dL (ref 3.5–5.0)
ALK PHOS: 107 U/L (ref 38–126)
ALT: 11 U/L — ABNORMAL LOW (ref 14–54)
ANION GAP: 6 (ref 5–15)
AST: 19 U/L (ref 15–41)
BILIRUBIN TOTAL: 0.6 mg/dL (ref 0.3–1.2)
BUN: 13 mg/dL (ref 6–20)
CALCIUM: 9.5 mg/dL (ref 8.9–10.3)
CO2: 28 mmol/L (ref 22–32)
Chloride: 106 mmol/L (ref 101–111)
Creatinine, Ser: 0.55 mg/dL (ref 0.44–1.00)
GFR calc non Af Amer: >60 mL/min (ref >60)
GLUCOSE: 114 mg/dL — AB (ref 65–99)
Potassium: 3.9 mmol/L (ref 3.5–5.1)
Sodium: 140 mmol/L (ref 135–145)
TOTAL PROTEIN: 7.2 g/dL (ref 6.5–8.1)

## 2016-11-01 LAB — PROTIME-INR
INR: 0.91
Prothrombin Time: 12.3 seconds (ref 11.4–15.2)

## 2016-11-01 LAB — ABO/RH: ABO/RH(D): O POS

## 2016-11-01 NOTE — Progress Notes (Signed)
Patient stated at time of preop appointment that she had EKG done at Dr Osborne Casco in 06/2016.  I called office and the last EKG done was 12/2015 .

## 2016-11-02 ENCOUNTER — Other Ambulatory Visit: Payer: Self-pay | Admitting: Radiology

## 2016-11-03 ENCOUNTER — Encounter (HOSPITAL_COMMUNITY): Payer: Self-pay | Admitting: Certified Registered Nurse Anesthetist

## 2016-11-03 ENCOUNTER — Encounter (HOSPITAL_COMMUNITY)
Admission: RE | Disposition: A | Discharge status: Home / Self Care | Payer: Self-pay | Source: Ambulatory Visit | Attending: Interventional Radiology

## 2016-11-03 ENCOUNTER — Ambulatory Visit (HOSPITAL_COMMUNITY): Payer: PPO | Admitting: Certified Registered Nurse Anesthetist

## 2016-11-03 ENCOUNTER — Encounter (HOSPITAL_COMMUNITY): Payer: Self-pay

## 2016-11-03 ENCOUNTER — Ambulatory Visit (HOSPITAL_COMMUNITY)
Admission: RE | Admit: 2016-11-03 | Discharge: 2016-11-03 | Disposition: A | Discharge status: Home / Self Care | Payer: PPO | Source: Ambulatory Visit | Attending: Interventional Radiology | Admitting: Interventional Radiology

## 2016-11-03 ENCOUNTER — Observation Stay (HOSPITAL_COMMUNITY)
Admission: RE | Admit: 2016-11-03 | Discharge: 2016-11-04 | Disposition: A | Discharge status: Home / Self Care | Payer: PPO | Source: Ambulatory Visit | Attending: Interventional Radiology | Admitting: Interventional Radiology

## 2016-11-03 ENCOUNTER — Encounter (HOSPITAL_COMMUNITY): Payer: Self-pay | Admitting: *Deleted

## 2016-11-03 DIAGNOSIS — E7800 Pure hypercholesterolemia, unspecified: Secondary | ICD-10-CM | POA: Insufficient documentation

## 2016-11-03 DIAGNOSIS — R002 Palpitations: Secondary | ICD-10-CM | POA: Diagnosis not present

## 2016-11-03 DIAGNOSIS — K589 Irritable bowel syndrome without diarrhea: Secondary | ICD-10-CM | POA: Diagnosis not present

## 2016-11-03 DIAGNOSIS — Z887 Allergy status to serum and vaccine status: Secondary | ICD-10-CM | POA: Diagnosis not present

## 2016-11-03 DIAGNOSIS — I10 Essential (primary) hypertension: Secondary | ICD-10-CM | POA: Diagnosis not present

## 2016-11-03 DIAGNOSIS — Z7982 Long term (current) use of aspirin: Secondary | ICD-10-CM | POA: Insufficient documentation

## 2016-11-03 DIAGNOSIS — Z885 Allergy status to narcotic agent status: Secondary | ICD-10-CM | POA: Diagnosis not present

## 2016-11-03 DIAGNOSIS — Z90710 Acquired absence of both cervix and uterus: Secondary | ICD-10-CM | POA: Diagnosis not present

## 2016-11-03 DIAGNOSIS — C221 Intrahepatic bile duct carcinoma: Principal | ICD-10-CM | POA: Insufficient documentation

## 2016-11-03 DIAGNOSIS — K219 Gastro-esophageal reflux disease without esophagitis: Secondary | ICD-10-CM | POA: Diagnosis not present

## 2016-11-03 DIAGNOSIS — K7689 Other specified diseases of liver: Secondary | ICD-10-CM | POA: Diagnosis not present

## 2016-11-03 DIAGNOSIS — R011 Cardiac murmur, unspecified: Secondary | ICD-10-CM | POA: Diagnosis not present

## 2016-11-03 DIAGNOSIS — R160 Hepatomegaly, not elsewhere classified: Secondary | ICD-10-CM

## 2016-11-03 LAB — TYPE AND SCREEN
ABO/RH(D): O POS
Antibody Screen: NEGATIVE

## 2016-11-03 SURGERY — RADIO FREQUENCY ABLATION
Anesthesia: General

## 2016-11-03 MED ORDER — EPHEDRINE SULFATE-NACL 50-0.9 MG/10ML-% IV SOSY
PREFILLED_SYRINGE | INTRAVENOUS | Status: DC | PRN
  Administered 2016-11-03 (×2): 10 mg via INTRAVENOUS
  Administered 2016-11-03: 20 mg via INTRAVENOUS

## 2016-11-03 MED ORDER — NAPHAZOLINE-GLYCERIN 0.012-0.2 % OP SOLN
1.0000 [drp] | Freq: Four times a day (QID) | OPHTHALMIC | Status: DC | PRN
  Filled 2016-11-03: qty 15

## 2016-11-03 MED ORDER — MIDAZOLAM HCL 2 MG/2ML IJ SOLN: 0.5000 mg | Freq: Once | INTRAMUSCULAR | Status: DC | PRN

## 2016-11-03 MED ORDER — DOCUSATE SODIUM 100 MG PO CAPS: 100.0000 mg | ORAL_CAPSULE | Freq: Three times a day (TID) | ORAL | Status: DC | PRN

## 2016-11-03 MED ORDER — PROPOFOL 10 MG/ML IV BOLUS
INTRAVENOUS | Status: DC | PRN
  Administered 2016-11-03: 120 mg via INTRAVENOUS

## 2016-11-03 MED ORDER — PROMETHAZINE HCL 25 MG/ML IJ SOLN
6.2500 mg | INTRAMUSCULAR | Status: DC | PRN
  Administered 2016-11-03: 6.25 mg via INTRAVENOUS

## 2016-11-03 MED ORDER — DEXAMETHASONE SODIUM PHOSPHATE 10 MG/ML IJ SOLN
INTRAMUSCULAR | Status: DC | PRN
  Administered 2016-11-03: 10 mg via INTRAVENOUS

## 2016-11-03 MED ORDER — CLONAZEPAM 0.5 MG PO TABS
0.5000 mg | ORAL_TABLET | Freq: Every day | ORAL | Status: DC
  Administered 2016-11-03: 0.5 mg via ORAL
  Filled 2016-11-03: qty 1

## 2016-11-03 MED ORDER — FENTANYL CITRATE (PF) 100 MCG/2ML IJ SOLN
INTRAMUSCULAR | Status: AC
  Filled 2016-11-03: qty 4

## 2016-11-03 MED ORDER — ASPIRIN EC 81 MG PO TBEC
81.0000 mg | DELAYED_RELEASE_TABLET | Freq: Every day | ORAL | Status: DC
  Administered 2016-11-03 – 2016-11-04 (×2): 81 mg via ORAL
  Filled 2016-11-03 (×2): qty 1

## 2016-11-03 MED ORDER — PANTOPRAZOLE SODIUM 40 MG PO TBEC
40.0000 mg | DELAYED_RELEASE_TABLET | Freq: Every day | ORAL | Status: DC
  Administered 2016-11-03 – 2016-11-04 (×2): 40 mg via ORAL
  Filled 2016-11-03 (×2): qty 1

## 2016-11-03 MED ORDER — AMLODIPINE BESYLATE 5 MG PO TABS
5.0000 mg | ORAL_TABLET | Freq: Every day | ORAL | Status: DC
  Administered 2016-11-04: 5 mg via ORAL
  Filled 2016-11-03: qty 1

## 2016-11-03 MED ORDER — PROMETHAZINE HCL 25 MG/ML IJ SOLN
INTRAMUSCULAR | Status: AC
  Filled 2016-11-03: qty 1

## 2016-11-03 MED ORDER — DOXAZOSIN MESYLATE 1 MG PO TABS
1.0000 mg | ORAL_TABLET | Freq: Every day | ORAL | Status: DC
  Administered 2016-11-03: 1 mg via ORAL
  Filled 2016-11-03: qty 1

## 2016-11-03 MED ORDER — PROPOFOL 10 MG/ML IV BOLUS
INTRAVENOUS | Status: AC
  Filled 2016-11-03: qty 20

## 2016-11-03 MED ORDER — ONDANSETRON HCL 4 MG/2ML IJ SOLN
INTRAMUSCULAR | Status: AC
  Filled 2016-11-03: qty 2

## 2016-11-03 MED ORDER — OMEPRAZOLE MAGNESIUM 20 MG PO TBEC: 20.0000 mg | DELAYED_RELEASE_TABLET | Freq: Every day | ORAL | Status: DC

## 2016-11-03 MED ORDER — FENTANYL CITRATE (PF) 100 MCG/2ML IJ SOLN
INTRAMUSCULAR | Status: DC | PRN
  Administered 2016-11-03: 100 ug via INTRAVENOUS
  Administered 2016-11-03: 50 ug via INTRAVENOUS

## 2016-11-03 MED ORDER — FENTANYL CITRATE (PF) 100 MCG/2ML IJ SOLN
25.0000 ug | INTRAMUSCULAR | Status: DC | PRN
Start: 2016-11-03 — End: 2016-11-03

## 2016-11-03 MED ORDER — SUGAMMADEX SODIUM 200 MG/2ML IV SOLN
INTRAVENOUS | Status: DC | PRN
  Administered 2016-11-03: 200 mg via INTRAVENOUS

## 2016-11-03 MED ORDER — PRAVASTATIN SODIUM 20 MG PO TABS
40.0000 mg | ORAL_TABLET | Freq: Every evening | ORAL | Status: DC
  Administered 2016-11-03: 40 mg via ORAL
  Filled 2016-11-03: qty 2

## 2016-11-03 MED ORDER — MEPERIDINE HCL 50 MG/ML IJ SOLN: 6.2500 mg | INTRAMUSCULAR | Status: DC | PRN

## 2016-11-03 MED ORDER — ONDANSETRON HCL 4 MG/2ML IJ SOLN
INTRAMUSCULAR | Status: DC | PRN
  Administered 2016-11-03: 4 mg via INTRAVENOUS

## 2016-11-03 MED ORDER — LIDOCAINE 2% (20 MG/ML) 5 ML SYRINGE
INTRAMUSCULAR | Status: DC | PRN
  Administered 2016-11-03: 70 mg via INTRAVENOUS

## 2016-11-03 MED ORDER — VITAMIN D 1000 UNITS PO TABS
5000.0000 [IU] | ORAL_TABLET | Freq: Every day | ORAL | Status: DC
  Administered 2016-11-04: 5000 [IU] via ORAL
  Filled 2016-11-03: qty 5

## 2016-11-03 MED ORDER — CLONAZEPAM 0.5 MG PO TABS: 0.2500 mg | ORAL_TABLET | Freq: Two times a day (BID) | ORAL | Status: DC

## 2016-11-03 MED ORDER — BIOTIN 5000 MCG PO TABS: 5000.0000 ug | ORAL_TABLET | ORAL | Status: DC

## 2016-11-03 MED ORDER — PIPERACILLIN-TAZOBACTAM 3.375 G IVPB
3.3750 g | Freq: Once | INTRAVENOUS | Status: AC
  Administered 2016-11-03: 3.375 g via INTRAVENOUS
  Filled 2016-11-03: qty 50

## 2016-11-03 MED ORDER — ONDANSETRON HCL 4 MG/2ML IJ SOLN: 4.0000 mg | Freq: Four times a day (QID) | INTRAMUSCULAR | Status: DC | PRN

## 2016-11-03 MED ORDER — PHENYLEPHRINE 40 MCG/ML (10ML) SYRINGE FOR IV PUSH (FOR BLOOD PRESSURE SUPPORT)
PREFILLED_SYRINGE | INTRAVENOUS | Status: DC | PRN
  Administered 2016-11-03 (×2): 80 ug via INTRAVENOUS
  Administered 2016-11-03 (×2): 40 ug via INTRAVENOUS

## 2016-11-03 MED ORDER — SENNOSIDES-DOCUSATE SODIUM 8.6-50 MG PO TABS
1.0000 | ORAL_TABLET | Freq: Three times a day (TID) | ORAL | Status: DC | PRN
Start: 2016-11-03 — End: 2016-11-04

## 2016-11-03 MED ORDER — LACTATED RINGERS IV SOLN
INTRAVENOUS | Status: DC
  Administered 2016-11-03 (×3): via INTRAVENOUS

## 2016-11-03 MED ORDER — KETOROLAC TROMETHAMINE 30 MG/ML IJ SOLN: 30.0000 mg | Freq: Three times a day (TID) | INTRAMUSCULAR | Status: DC | PRN

## 2016-11-03 MED ORDER — CLONAZEPAM 0.5 MG PO TABS
0.2500 mg | ORAL_TABLET | Freq: Two times a day (BID) | ORAL | Status: DC
  Administered 2016-11-04: 0.25 mg via ORAL
  Filled 2016-11-03: qty 1

## 2016-11-03 MED ORDER — ROCURONIUM BROMIDE 10 MG/ML (PF) SYRINGE
PREFILLED_SYRINGE | INTRAVENOUS | Status: DC | PRN
  Administered 2016-11-03: 10 mg via INTRAVENOUS
  Administered 2016-11-03: 50 mg via INTRAVENOUS
  Administered 2016-11-03 (×2): 10 mg via INTRAVENOUS

## 2016-11-03 NOTE — H&P (Signed)
Referring Physician(s):  Byer[place],F  Supervising Physician: Daryll Brod  Patient Status:  [facility] OP TBA  Chief Complaint: Adenocarcinoma of liver, probable cholangiocarcinoma   Subjective: Patient familiar to IR service from prior right hepatic lobe lesion biopsy in July 2017. 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 She was seen in consultation by Dr. Annamaria Boots on 09/27/16 to discuss treatment options and was deemed an appropriate candidate for CT-guided microwave ablation of this solitary 3.5 cm liver lesion. She presents today for the procedure. She currently denies fever, headache, chest pain, dyspnea, abdominal/back pain, nausea, vomiting or abnormal bleeding. Additional history as below. Past Medical History:  Diagnosis Date  . Cancer ([place])   . GERD (gastroesophageal reflux disease)   . Heart murmur   . High cholesterol   . Hypertension   . IBS (irritable bowel syndrome)   . PONV (postoperative nausea and vomiting)    Past Surgical History:  Procedure Laterality Date  . BREAST SURGERY     Benign  . CHOLECYSTECTOMY    . TOTAL ABDOMINAL HYSTERECTOMY       Allergies: Pneumococcal vaccines and Codeine  Medications: Prior to Admission medications   Medication Sig Start Date End Date Taking? Authorizing Provider  amLODipine (NORVASC) 5 MG tablet Take 5 mg by mouth daily.  06/22/14  Yes Historical Provider, MD  aspirin EC 81 MG tablet Take 81 mg by mouth daily.   Yes Historical Provider, MD  Biotin 5000 MCG TABS Take 5,000 mcg by mouth every other day.   Yes Historical Provider, MD  Cholecalciferol (VITAMIN D3) 5000 units CAPS Take 5,000 Units by mouth daily.   Yes Historical Provider, MD  clonazePAM (KLONOPIN) 0.5 MG tablet Take 0.25-0.5 mg by mouth. Take 0.25mg s twice daily and 0.5mg s at bedtime, may take an additional 0.25mg s as needed for anxiety   Yes Historical Provider, MD  docusate sodium (COLACE) 50 MG capsule Take 50 mg by mouth 3  (three) times daily as needed for mild constipation.   Yes Historical Provider, MD  doxazosin (CARDURA) 1 MG tablet Take 1 mg by mouth at bedtime.  06/22/14  Yes Historical Provider, MD  omeprazole (PRILOSEC OTC) 20 MG tablet Take 20 mg by mouth daily.   Yes Historical Provider, MD  pravastatin (PRAVACHOL) 40 MG tablet Take 40 mg by mouth every evening.    Yes Historical Provider, MD  senna-docusate (SENOKOT-S) 8.6-50 MG tablet Take 1 tablet by mouth 3 (three) times daily as needed for mild constipation.   Yes Historical Provider, MD  tetrahydrozoline 0.05 % ophthalmic solution Place 1 drop into both eyes daily as needed (dry eye).    Yes Historical Provider, MD  ondansetron (ZOFRAN) 8 MG tablet Take 1 tablet (8 mg total) by mouth 2 (two) times daily as needed for nausea or vomiting. Patient not taking: Reported on 10/27/2016 07/19/16   Ladell Pier, MD  traMADol (ULTRAM) 50 MG tablet Take 1 tablet (50 mg total) by mouth every 6 (six) hours as needed for severe pain. Patient not taking: Reported on 10/27/2016 06/17/16   Dalia Heading, PA-C     Vital Signs: Ht 5\' 5"  (1.651 m)   Wt 138 lb (62.6 kg) Comment: 2 days ago in PST  BMI 22.96 kg/m   Physical Exam patient awake, alert. Chest clear to auscultation bilaterally. Heart with regular rate and rhythm. Abdomen soft, positive bowel sounds, nontender. Lower extremities - no edema.  Imaging: Dg Chest 1 View  Result Date: 11/01/2016 CLINICAL DATA:  Preoperative evaluation  for liver ablation. EXAM: CHEST 1 VIEW COMPARISON:  08/18/2012 FINDINGS: Lungs are clear without focal airspace disease or pulmonary edema. Evidence for mild scarring at the lung apices. Heart and mediastinum are within normal limits. Trachea is midline. Negative for a pneumothorax. No acute bone abnormality. Degenerative changes in the thoracic spine. IMPRESSION: No active disease. Electronically Signed   By: Markus Daft M.D.   On: 11/01/2016 10:05     Labs:  CBC:  Recent Labs  06/17/16 1443 07/10/16 1210 11/01/16 0830  WBC 3.9* 4.7 4.1  HGB 12.9 12.8 12.0  HCT 37.8 38.7 36.8  PLT 203 203 241    COAGS:  Recent Labs  07/10/16 1210 11/01/16 0830  INR 1.01 0.91  APTT 27  --     BMP:  Recent Labs  06/17/16 1443 11/01/16 0830  NA 140 140  K 3.7 3.9  CL 106 106  CO2 24 28  GLUCOSE 113* 114*  BUN 14 13  CALCIUM 9.7 9.5  CREATININE 0.63 0.55  GFRNONAA >60 >60  GFRAA >60 >60    LIVER FUNCTION TESTS:  Recent Labs  06/17/16 1443 11/01/16 0830  BILITOT 0.6 0.6  AST 19 19  ALT 14 11*  ALKPHOS 88 107  PROT 7.8 7.2  ALBUMIN 4.7 4.2    Assessment and Plan: Patient with history of 3.5 cm solitary right hepatic lobe mass in segment 6 with pathology revealing adenocarcinoma, suspect pancreatobiliary primary. 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 Seen recently in consultation by Dr. Annamaria Boots and deemed an appropriate candidate for CT-guided microwave ablation of this liver lesion. She presents today for the procedure. Details/risks of procedure, including but not limited to, internal bleeding, infection, anesthesia-related complications, injury to adjacent structures, discussed with patient and daughter with their understanding and consent. Post procedure she will be admitted for overnight observation.   Electronically Signed: D. Rowe Robert 11/03/2016, 10:50 AM   I spent a total of 30 minutes at the the patient's bedside AND on the patient's hospital floor or unit, greater than 50% of which was counseling/coordinating care for CT guided wave ablation of liver lesion/cholangiocarcinoma      cm

## 2016-11-03 NOTE — Procedures (Signed)
Rt intrahepatic cholangiocarcinoma  S/p Ct guided liver mass microwave ablation  No comp Stable EBL 0 General anesthesia Stable to PACU Full report in PACS

## 2016-11-03 NOTE — Progress Notes (Signed)

## 2016-11-03 NOTE — Sedation Documentation (Signed)
Anesthesia caring for patinet

## 2016-11-03 NOTE — Anesthesia Procedure Notes (Signed)
Procedure Name: Intubation Date/Time: 11/03/2016 2:04 PM Performed by: Montel Clock Pre-anesthesia Checklist: Patient identified, Emergency Drugs available, Suction available, Patient being monitored and Timeout performed Patient Re-evaluated:Patient Re-evaluated prior to inductionOxygen Delivery Method: Circle system utilized Preoxygenation: Pre-oxygenation with 100% oxygen Intubation Type: IV induction Ventilation: Mask ventilation without difficulty and Oral airway inserted - appropriate to patient size Laryngoscope Size: Mac and 3 Grade View: Grade I Tube type: Oral Tube size: 7.0 mm Number of attempts: 1 Airway Equipment and Method: Stylet Placement Confirmation: ETT inserted through vocal cords under direct vision,  positive ETCO2 and breath sounds checked- equal and bilateral Secured at: 21 cm Tube secured with: Tape Dental Injury: Teeth and Oropharynx as per pre-operative assessment

## 2016-11-03 NOTE — Anesthesia Preprocedure Evaluation (Addendum)
Anesthesia Evaluation  Patient identified by MRN, date of birth, ID band Patient awake    Reviewed: Allergy & Precautions, NPO status , Patient's Chart, lab work & pertinent test results  History of Anesthesia Complications (+) PONV  Airway Mallampati: I  TM Distance: >3 FB     Dental   Pulmonary    breath sounds clear to auscultation       Cardiovascular hypertension,  Rhythm:Regular Rate:Normal     Neuro/Psych negative neurological ROS     GI/Hepatic Neg liver ROS, GERD  ,  Endo/Other  negative endocrine ROS  Renal/GU negative Renal ROS     Musculoskeletal   Abdominal   Peds  Hematology   Anesthesia Other Findings   Reproductive/Obstetrics                             Anesthesia Physical Anesthesia Plan  ASA: III  Anesthesia Plan:    Post-op Pain Management:    Induction: Intravenous  Airway Management Planned: Oral ETT  Additional Equipment:   Intra-op Plan:   Post-operative Plan: Possible Post-op intubation/ventilation  Informed Consent: I have reviewed the patients History and Physical, chart, labs and discussed the procedure including the risks, benefits and alternatives for the proposed anesthesia with the patient or authorized representative who has indicated his/her understanding and acceptance.   Dental advisory given  Plan Discussed with: CRNA and Anesthesiologist  Anesthesia Plan Comments:         Anesthesia Quick Evaluation

## 2016-11-03 NOTE — Anesthesia Postprocedure Evaluation (Signed)
Anesthesia Post Note  Patient: Laura [name]  Procedure(s) Performed: Procedure(s) (LRB): Thermal ABLATION liver mass (N/A)  Patient location during evaluation: PACU Anesthesia Type: General Level of consciousness: oriented, patient cooperative and sedated Pain management: pain level controlled Vital Signs Assessment: post-procedure vital signs reviewed and stable Respiratory status: spontaneous breathing, nonlabored ventilation, respiratory function stable and patient connected to nasal cannula oxygen Cardiovascular status: blood pressure returned to baseline and stable Postop Assessment: no signs of nausea or vomiting Anesthetic complications: no    Last Vitals:  Vitals:   11/03/16 1715 11/03/16 1740  BP: (!) 149/60 (!) 154/66  Pulse: 70 71  Resp: 17 18  Temp: 36.6 C 36.6 C    Last Pain: There were no vitals filed for this visit.               Midge Minium

## 2016-11-03 NOTE — Transfer of Care (Signed)
Immediate Anesthesia Transfer of Care Note  Patient: Laura [name]  Procedure(s) Performed: Procedure(s): Thermal ABLATION liver mass (N/A)  Patient Location: PACU  Anesthesia Type:General  Level of Consciousness:  sedated, patient cooperative and responds to stimulation  Airway & Oxygen Therapy:Patient Spontanous Breathing and Patient connected to face mask oxgen  Post-op Assessment:  Report given to PACU RN and Post -op Vital signs reviewed and stable  Post vital signs:  Reviewed and stable  Last Vitals: There were no vitals filed for this visit.  Complications: No apparent anesthesia complications

## 2016-11-04 DIAGNOSIS — C221 Intrahepatic bile duct carcinoma: Secondary | ICD-10-CM | POA: Diagnosis not present

## 2016-11-04 LAB — CBC
HCT: 35 % — ABNORMAL LOW (ref 36.0–46.0)
HEMOGLOBIN: 11.5 g/dL — AB (ref 12.0–15.0)
MCH: 28.7 pg (ref 26.0–34.0)
MCHC: 32.9 g/dL (ref 30.0–36.0)
MCV: 87.3 fL (ref 78.0–100.0)
Platelets: 216 10*3/uL (ref 150–400)
RBC: 4.01 MIL/uL (ref 3.87–5.11)
RDW: 13.5 % (ref 11.5–15.5)
WBC: 5.5 10*3/uL (ref 4.0–10.5)

## 2016-11-04 NOTE — Progress Notes (Signed)
Discharge teaching completed with teach back. Discharge instructions given and reviewed with pt. No prescriptions given. Radiology clinic to call for follow up appointment.

## 2016-11-04 NOTE — Discharge Summary (Signed)
Patient ID: TESSALYN DEST MRN: [id number] DOB/AGE: 1941/02/05 75 y.o.  Admit date: 11/03/2016 Discharge date: 11/04/2016  Supervising Physician: Daryll Brod  Patient Status: [facility] - In[facility]pt  Admission Diagnoses: Adenocarcinoma of liver                                         Probable cholangiocarcinoma  Discharge Diagnoses:  Active Problems:   Cholangiocarcinoma [facility])   Discharged Condition: stable  Hospital Course: Rt hepatic lesion- + adenocarcinoma. Thermal ablation performed in IR with Dr Annamaria Boots 11/10. Admitted for overnight observation---without issue overnight. No complications. Denies pain; denies N/V Urinating well. Ambulating in room.  I have seen and examined pt. Dr Annamaria Boots aware of pt status. Plan for discharge to home now.   Consults: None  Significant Diagnostic Studies: CT Abdomen  Treatments: Rt intrahepatic cholangiocarcinoma S/p Ct guided liver mass microwave ablation  Discharge Exam: Blood pressure (!) 142/61, pulse 63, temperature 97.6 F (36.4 C), temperature source Oral, resp. rate 17, height 5\' 5"  (1.651 m), weight 138 lb (62.6 kg), SpO2 98 %.  PE: A/O; pleasant Appropriate Heart: RRR Lungs: CTA Abd: soft; +BS;; NT No masses Site of Rt liver lesion thermal ablation is clean and dry NT no bleeding; no hematoma Extr: FROM Ambulating in room UOP great Passing gas  Results for orders placed or performed during the hospital encounter of 11/03/16  CBC  Result Value Ref Range   WBC 5.5 4.0 - 10.5 K/uL   RBC 4.01 3.87 - 5.11 MIL/uL   Hemoglobin 11.5 (L) 12.0 - 15.0 g/dL   HCT 35.0 (L) 36.0 - 46.0 %   MCV 87.3 78.0 - 100.0 fL   MCH 28.7 26.0 - 34.0 pg   MCHC 32.9 30.0 - 36.0 g/dL   RDW 13.5 11.5 - 15.5 %   Platelets 216 150 - 400 K/uL    Disposition: Right hepatic lesion + adenocarcinoma Thermal ablation performed in IR with dr Annamaria Boots 11/03/16 Doing great No complaints Plan for discharge to home. She has good understanding of  discharge instructions Resume all meds 4 week follow up with Dr Docia Chuck will hear from clinic with time and date  Discharge Instructions    Call MD for:  difficulty breathing, headache or visual disturbances    Complete by:  As directed    Call MD for:  extreme fatigue    Complete by:  As directed    Call MD for:  hives    Complete by:  As directed    Call MD for:  persistant dizziness or light-headedness    Complete by:  As directed    Call MD for:  persistant nausea and vomiting    Complete by:  As directed    Call MD for:  redness, tenderness, or signs of infection (pain, swelling, redness, odor or green/yellow discharge around incision site)    Complete by:  As directed    Call MD for:  severe uncontrolled pain    Complete by:  As directed    Call MD for:  temperature >100.4    Complete by:  As directed    Diet - low sodium heart healthy    Complete by:  As directed    Discharge instructions    Complete by:  As directed    4 week follow up with Dr Annamaria Boots; pt will hear from clinic of time and date; call [phone]  if questions or concerns   Discharge wound care:    Complete by:  As directed    May shower today; keep new bandaid on Rt ablation site daily x 1 week   Driving Restrictions    Complete by:  As directed    No driving x 3 days   Increase activity slowly    Complete by:  As directed    Lifting restrictions    Complete by:  As directed    No lifting over 10 lbs x 3 days       Medication List    TAKE these medications   amLODipine 5 MG tablet Commonly known as:  NORVASC Take 5 mg by mouth daily.   aspirin EC 81 MG tablet Take 81 mg by mouth daily.   Biotin 5000 MCG Tabs Take 5,000 mcg by mouth every other day.   clonazePAM 0.5 MG tablet Commonly known as:  KLONOPIN Take 0.25-0.5 mg by mouth. Take 0.25mg s twice daily and 0.5mg s at bedtime, may take an additional 0.25mg s as needed for anxiety   docusate sodium 50 MG capsule Commonly known as:   COLACE Take 50 mg by mouth 3 (three) times daily as needed for mild constipation.   doxazosin 1 MG tablet Commonly known as:  CARDURA Take 1 mg by mouth at bedtime.   omeprazole 20 MG tablet Commonly known as:  PRILOSEC OTC Take 20 mg by mouth daily.   ondansetron 8 MG tablet Commonly known as:  ZOFRAN Take 1 tablet (8 mg total) by mouth 2 (two) times daily as needed for nausea or vomiting.   pravastatin 40 MG tablet Commonly known as:  PRAVACHOL Take 40 mg by mouth every evening.   senna-docusate 8.6-50 MG tablet Commonly known as:  Senokot-S Take 1 tablet by mouth 3 (three) times daily as needed for mild constipation.   tetrahydrozoline 0.05 % ophthalmic solution Place 1 drop into both eyes daily as needed (dry eye).   traMADol 50 MG tablet Commonly known as:  ULTRAM Take 1 tablet (50 mg total) by mouth every 6 (six) hours as needed for severe pain.   Vitamin D3 5000 units Caps Take 5,000 Units by mouth daily.      Follow-up Information    SHICK, MICHAEL, MD Follow up in 4 week(s).   Specialty:  Interventional Radiology Why:  pt to see Dr Annamaria Boots in 4 week follow up; she will hear from clinic for time and date; call [phone] if questions or concerns Contact information: [address] [place] [address] [phone]            Electronically Signed: Monia Sabal A 11/04/2016, 9:30 AM   I have spent Greater Than 30 Minutes discharging Emeline Darling.

## 2016-11-14 ENCOUNTER — Encounter: Payer: Self-pay | Admitting: Interventional Radiology

## 2016-11-30 ENCOUNTER — Ambulatory Visit (HOSPITAL_BASED_OUTPATIENT_CLINIC_OR_DEPARTMENT_OTHER): Payer: PPO | Admitting: Oncology

## 2016-11-30 VITALS — BP 155/54 | HR 71 | Temp 98.0°F | Resp 18 | Ht 65.0 in | Wt 137.7 lb

## 2016-11-30 DIAGNOSIS — C801 Malignant (primary) neoplasm, unspecified: Secondary | ICD-10-CM | POA: Diagnosis not present

## 2016-11-30 DIAGNOSIS — C787 Secondary malignant neoplasm of liver and intrahepatic bile duct: Secondary | ICD-10-CM

## 2016-11-30 NOTE — Progress Notes (Signed)
  [facility] OFFICE PROGRESS NOTE   Diagnosis: Metastatic adenocarcinoma  INTERVAL HISTORY:   Ms. Krut returns as scheduled. She underwent CT-guided ablation of the right liver lesion on 11/03/2016 per she reports tolerating the procedure well. She is scheduled to see Dr. Annamaria Boots next week. She relates weight loss to the illness of her husband.  Objective:  Vital signs in last 24 hours:  Blood pressure (!) 155/54, pulse 71, temperature 98 F (36.7 C), temperature source Oral, resp. rate 18, height 5\' 5"  (1.651 m), weight 137 lb 11.2 oz (62.5 kg), SpO2 100 %.    HEENT: Neck without mass Lymphatics: No cervical, supraclavicular, axillary, or inguinal nodes Resp: Lungs clear bilaterally Cardio: Regular rate and rhythm GI: No hepatomegaly, no mass, nontender Vascular: No leg edema   Medications: I have reviewed the patient's current medications.  Assessment/Plan: 1. Right liver mass-biopsy 07/10/2016 confirmed adenocarcinoma  CT abdomen/pelvis 06/17/2016 and MRI abdomen 06/21/2016 confirmed an isolated right liver mass and a cystic pancreas lesion  PET scan 07/28/2016-the hepatic lesion is not hypermetabolic, hypermetabolic lesion in the gastric antrum suspicious for a primary neoplasm almond no other evidence of metastatic disease  Upper endoscopy 08/03/2016-no mass found, biopsy from the gastric antrum-benign  CT-guided ablation of the right liver lesion 11/03/2016 2. Left leg and right foot pain  Negative left leg Doppler 07/14/2016  3. Right abdominal pain  4. Anorexia/weight loss  5. Benign appearing 16 mm cystic uncinatepancreas lesion noted on MRI 06/20/2016     Disposition:  Ms. Nile underwent CT-guided ablation of the right liver lesion on 11/03/2016. She appears well. She will see Dr. Annamaria Boots next week. He will direct follow-up imaging. She will return for an office visit here in 4 months. We are available to see her sooner as  needed. There is no clinical evidence of disease progression.  [name], MD  11/30/2016  2:17 PM

## 2016-12-07 ENCOUNTER — Other Ambulatory Visit: Payer: PPO

## 2016-12-07 ENCOUNTER — Ambulatory Visit
Admission: RE | Admit: 2016-12-07 | Discharge: 2016-12-07 | Disposition: A | Discharge status: Home / Self Care | Payer: PPO | Source: Ambulatory Visit | Attending: Radiology | Admitting: Radiology

## 2016-12-07 DIAGNOSIS — C221 Intrahepatic bile duct carcinoma: Secondary | ICD-10-CM | POA: Diagnosis not present

## 2016-12-07 HISTORY — PX: IR GENERIC HISTORICAL: IMG1180011

## 2016-12-07 NOTE — Progress Notes (Signed)
Patient ID: Laura [name], female   DOB: Jul 24, 1941, 75 y.o.   MRN: [id number]       Chief Complaint: Solitary right liver mass, status post microwave ablation 1 month ago, outpatient follow-up  Referring Physician(s): Sherrill  History of Present Illness: Laura [name] is a 75 y.o. female who is now one month status post microwave ablation of the right hepatic adenocarcinoma, compatible with an intrahepatic cholangiocarcinoma. She returns for outpatient follow-up in one month. Overall she has recovered very well. Mild right upper quadrant pain has resolved. No current abdominal or flank pain. No signs of jaundice or progressive liver disease. No interval fevers. She remains asymptomatic. She is back to her baseline function. Stable weight and appetite.   Past Medical History:  Diagnosis Date  . Cancer ([place])   . GERD (gastroesophageal reflux disease)   . Heart murmur   . High cholesterol   . Hypertension   . IBS (irritable bowel syndrome)   . PONV (postoperative nausea and vomiting)     Past Surgical History:  Procedure Laterality Date  . BREAST SURGERY     Benign  . CHOLECYSTECTOMY    . IR GENERIC HISTORICAL  09/27/2016   IR RADIOLOGIST EVAL & MGMT 09/27/2016 Greggory Keen, MD [facility] INTERV RAD  . TOTAL ABDOMINAL HYSTERECTOMY      Allergies: Pneumococcal vaccines and Codeine  Medications: Prior to Admission medications   Medication Sig Start Date End Date Taking? Authorizing Provider  amLODipine (NORVASC) 5 MG tablet Take 5 mg by mouth daily.  06/22/14  Yes Historical Provider, MD  aspirin EC 81 MG tablet Take 81 mg by mouth daily.   Yes Historical Provider, MD  Biotin 5000 MCG TABS Take 5,000 mcg by mouth every other day.   Yes Historical Provider, MD  Cholecalciferol (VITAMIN D3) 5000 units CAPS Take 5,000 Units by mouth daily.   Yes Historical Provider, MD  clonazePAM (KLONOPIN) 0.5 MG tablet Take 0.25-0.5 mg by mouth. Take 0.25mg s twice daily and 0.5mg s at bedtime,  may take an additional 0.25mg s as needed for anxiety   Yes Historical Provider, MD  docusate sodium (COLACE) 50 MG capsule Take 50 mg by mouth 3 (three) times daily as needed for mild constipation.   Yes Historical Provider, MD  doxazosin (CARDURA) 1 MG tablet Take 1 mg by mouth at bedtime.  06/22/14  Yes Historical Provider, MD  omeprazole (PRILOSEC OTC) 20 MG tablet Take 20 mg by mouth daily.   Yes Historical Provider, MD  pravastatin (PRAVACHOL) 40 MG tablet Take 40 mg by mouth every evening.    Yes Historical Provider, MD  tetrahydrozoline 0.05 % ophthalmic solution Place 1 drop into both eyes daily as needed (dry eye).    Yes Historical Provider, MD     Family History  Problem Relation Age of Onset  . Alzheimer's disease Mother   . Alzheimer's disease Brother   . Alzheimer's disease Sister   . CAD Brother 3    CABG  . Cancer Sister     Breast  . Cancer Brother     Lung    Social History   Social History  . Marital status: Married    Spouse name: Lanae Boast  . Number of children: 3  . Years of education: N/A   Occupational History  . Retired    Social History Main Topics  . Smoking status: Never Smoker  . Smokeless tobacco: Never Used  . Alcohol use No  . Drug use: No  . Sexual activity:  Not on file   Other Topics Concern  . Not on file   Social History Narrative   Lives at home with husband, Lanae Boast who is in poor health (she is caregiver)   Has #3 daughters   Retired from working at [employer]   Has total of #7 siblings    ECOG Status: 1 - Symptomatic but completely ambulatory  Review of Systems: A 12 point ROS discussed and pertinent positives are indicated in the HPI above.  All other systems are negative.  Review of Systems  Vital Signs: BP (!) 151/63   Pulse 87   Temp 98 F (36.7 C)   Resp 16   Ht 5' 5.5" (1.664 m)   Wt 136 lb (61.7 kg)   SpO2 94%   BMI 22.29 kg/m   Physical Exam  Constitutional: She is oriented to person, place, and  time. She appears well-developed and well-nourished. No distress.  Eyes: Conjunctivae are normal. No scleral icterus.  Cardiovascular: Normal rate and regular rhythm.   No murmur heard. Pulmonary/Chest: Effort normal and breath sounds normal. No respiratory distress.  Abdominal: Soft. Bowel sounds are normal. She exhibits no distension and no mass. There is no tenderness.  Neurological: She is alert and oriented to person, place, and time.  Skin: Skin is warm and dry. She is not diaphoretic. No erythema.  Psychiatric: She has a normal mood and affect. Her behavior is normal.     Imaging: No results found.  Labs:  CBC:  Recent Labs  06/17/16 1443 07/10/16 1210 11/01/16 0830 11/04/16 0403  WBC 3.9* 4.7 4.1 5.5  HGB 12.9 12.8 12.0 11.5*  HCT 37.8 38.7 36.8 35.0*  PLT 203 203 241 216    COAGS:  Recent Labs  07/10/16 1210 11/01/16 0830  INR 1.01 0.91  APTT 27  --     BMP:  Recent Labs  06/17/16 1443 11/01/16 0830  NA 140 140  K 3.7 3.9  CL 106 106  CO2 24 28  GLUCOSE 113* 114*  BUN 14 13  CALCIUM 9.7 9.5  CREATININE 0.63 0.55  GFRNONAA >60 >60  GFRAA >60 >60    LIVER FUNCTION TESTS:  Recent Labs  06/17/16 1443 11/01/16 0830  BILITOT 0.6 0.6  AST 19 19  ALT 14 11*  ALKPHOS 88 107  PROT 7.8 7.2  ALBUMIN 4.7 4.2    TUMOR MARKERS: No results for input(s): AFPTM, CEA, CA199, CHROMGRNA in the last 8760 hours.  Assessment and Plan:  1 month status post CT-guided microwave ablation of the right hepatic adenocarcinoma, presumed to be an intrahepatic cholangiocarcinoma. She is recovering very well. No current symptoms or delay complication.  Plan: Repeat outpatient surveillance MRI without and with contrast in 3 months.   Electronically Signed: Greggory Keen 12/07/2016, 4:14 PM   I spent a total of    25 Minutes in face to face in clinical consultation, greater than 50% of which was counseling/coordinating care for this patient with a right  hepatic adenocarcinoma (intrahepatic cholangiocarcinoma)

## 2016-12-14 ENCOUNTER — Telehealth: Payer: Self-pay | Admitting: Oncology

## 2016-12-14 NOTE — Telephone Encounter (Signed)
Spoke with patient re 4/5 f/u.

## 2016-12-29 ENCOUNTER — Encounter: Payer: Self-pay | Admitting: Interventional Radiology

## 2017-01-03 DIAGNOSIS — D225 Melanocytic nevi of trunk: Secondary | ICD-10-CM | POA: Diagnosis not present

## 2017-01-03 DIAGNOSIS — L308 Other specified dermatitis: Secondary | ICD-10-CM | POA: Diagnosis not present

## 2017-01-03 DIAGNOSIS — L570 Actinic keratosis: Secondary | ICD-10-CM | POA: Diagnosis not present

## 2017-01-03 DIAGNOSIS — X32XXXD Exposure to sunlight, subsequent encounter: Secondary | ICD-10-CM | POA: Diagnosis not present

## 2017-01-03 DIAGNOSIS — L821 Other seborrheic keratosis: Secondary | ICD-10-CM | POA: Diagnosis not present

## 2017-01-15 ENCOUNTER — Ambulatory Visit (INDEPENDENT_AMBULATORY_CARE_PROVIDER_SITE_OTHER): Payer: PPO | Admitting: Orthopedic Surgery

## 2017-01-15 ENCOUNTER — Encounter (INDEPENDENT_AMBULATORY_CARE_PROVIDER_SITE_OTHER): Payer: Self-pay | Admitting: Orthopedic Surgery

## 2017-01-15 VITALS — Ht 65.5 in | Wt 136.0 lb

## 2017-01-15 DIAGNOSIS — L84 Corns and callosities: Secondary | ICD-10-CM | POA: Diagnosis not present

## 2017-01-15 NOTE — Progress Notes (Signed)
Office Visit Note   Patient: Laura [name]           Date of Birth: 02-Jun-1941           MRN: [id number] Visit Date: 01/15/2017              Requested by: Haywood Pao, MD [address] [place], [place] [address] PCP: Haywood Pao, MD  Chief Complaint  Patient presents with  . Left Foot - Pain    HPI: Patient presents with left toe pain for a couple weeks. She has callus medial 2nd toe causing pain when wearing shoes. She states they was redness initially. She has stubbed toe multiple times. Maxcine Ham, RT  Patient is a 76 year old woman seen today for initial evaluation of left second toe pain with painful callus. This has been ongoing several weeks. No wound. No drainage. No sign of infection. In flip flops today, as closed toe shoes are more painful.    Assessment & Plan: Visit Diagnoses:  1. Corn of toe     Plan: Callus and corn pared today. Given a silicone toe spacer for shoe wear.   Follow-Up Instructions: Return if symptoms worsen or fail to improve.   Ortho Exam Patient is alert and oriented. No adenopathy. Well-dressed. Normal affect. Respirations easy. Left foot is plantigrade. Has dorsiflexion just beyond neutral. Second toe is not clawed. Does have callus and underlying corn over medial aspect over DIP. This was pared with a 10 blade knife. No erythema, open ulcer or drainage. No sign of infection.  Imaging: No results found.  Orders:  No orders of the defined types were placed in this encounter.  No orders of the defined types were placed in this encounter.    Procedures: No procedures performed  Clinical Data: No additional findings.  Subjective: Review of Systems  Constitutional: Negative for chills and fever.  Musculoskeletal: Negative for gait problem.  Skin: Negative for color change and wound.    Objective: Vital Signs: Ht 5' 5.5" (1.664 m)   Wt 136 lb (61.7 kg)   BMI 22.29 kg/m   Specialty Comments:  No  specialty comments available.  PMFS History: Patient Active Problem List   Diagnosis Date Noted  . Cholangiocarcinoma ([place]) 11/03/2016  . Palpitation 07/27/2014   Past Medical History:  Diagnosis Date  . Cancer ([place])   . GERD (gastroesophageal reflux disease)   . Heart murmur   . High cholesterol   . Hypertension   . IBS (irritable bowel syndrome)   . PONV (postoperative nausea and vomiting)     Family History  Problem Relation Age of Onset  . Alzheimer's disease Mother   . Alzheimer's disease Brother   . Alzheimer's disease Sister   . CAD Brother 38    CABG  . Cancer Sister     Breast  . Cancer Brother     Lung    Past Surgical History:  Procedure Laterality Date  . BREAST SURGERY     Benign  . CHOLECYSTECTOMY    . IR GENERIC HISTORICAL  09/27/2016   IR RADIOLOGIST EVAL & MGMT 09/27/2016 Greggory Keen, MD [facility] INTERV RAD  . IR GENERIC HISTORICAL  12/07/2016   IR RADIOLOGIST EVAL & MGMT 12/07/2016 Greggory Keen, MD [facility] INTERV RAD  . TOTAL ABDOMINAL HYSTERECTOMY     Social History   Occupational History  . Retired    Social History Main Topics  . Smoking status: Never Smoker  . Smokeless  tobacco: Never Used  . Alcohol use No  . Drug use: No  . Sexual activity: Not on file

## 2017-01-24 DIAGNOSIS — R7302 Impaired glucose tolerance (oral): Secondary | ICD-10-CM | POA: Diagnosis not present

## 2017-01-24 DIAGNOSIS — I10 Essential (primary) hypertension: Secondary | ICD-10-CM | POA: Diagnosis not present

## 2017-01-24 DIAGNOSIS — E7800 Pure hypercholesterolemia, unspecified: Secondary | ICD-10-CM | POA: Diagnosis not present

## 2017-01-24 DIAGNOSIS — R8299 Other abnormal findings in urine: Secondary | ICD-10-CM | POA: Diagnosis not present

## 2017-02-05 DIAGNOSIS — Z1212 Encounter for screening for malignant neoplasm of rectum: Secondary | ICD-10-CM | POA: Diagnosis not present

## 2017-02-06 DIAGNOSIS — D649 Anemia, unspecified: Secondary | ICD-10-CM | POA: Diagnosis not present

## 2017-02-06 DIAGNOSIS — D692 Other nonthrombocytopenic purpura: Secondary | ICD-10-CM | POA: Diagnosis not present

## 2017-02-06 DIAGNOSIS — I5189 Other ill-defined heart diseases: Secondary | ICD-10-CM | POA: Diagnosis not present

## 2017-02-06 DIAGNOSIS — Z0000 Encounter for general adult medical examination without abnormal findings: Secondary | ICD-10-CM | POA: Diagnosis not present

## 2017-02-06 DIAGNOSIS — I8000 Phlebitis and thrombophlebitis of superficial vessels of unspecified lower extremity: Secondary | ICD-10-CM | POA: Diagnosis not present

## 2017-02-06 DIAGNOSIS — I517 Cardiomegaly: Secondary | ICD-10-CM | POA: Diagnosis not present

## 2017-02-06 DIAGNOSIS — E7800 Pure hypercholesterolemia, unspecified: Secondary | ICD-10-CM | POA: Diagnosis not present

## 2017-02-06 DIAGNOSIS — R7302 Impaired glucose tolerance (oral): Secondary | ICD-10-CM | POA: Diagnosis not present

## 2017-02-06 DIAGNOSIS — Z6822 Body mass index (BMI) 22.0-22.9, adult: Secondary | ICD-10-CM | POA: Diagnosis not present

## 2017-02-06 DIAGNOSIS — D72819 Decreased white blood cell count, unspecified: Secondary | ICD-10-CM | POA: Diagnosis not present

## 2017-02-06 DIAGNOSIS — C228 Malignant neoplasm of liver, primary, unspecified as to type: Secondary | ICD-10-CM | POA: Diagnosis not present

## 2017-02-06 DIAGNOSIS — I10 Essential (primary) hypertension: Secondary | ICD-10-CM | POA: Diagnosis not present

## 2017-02-20 ENCOUNTER — Other Ambulatory Visit: Payer: Self-pay | Admitting: *Deleted

## 2017-02-20 ENCOUNTER — Other Ambulatory Visit (HOSPITAL_COMMUNITY): Payer: Self-pay | Admitting: Interventional Radiology

## 2017-02-20 DIAGNOSIS — C229 Malignant neoplasm of liver, not specified as primary or secondary: Secondary | ICD-10-CM

## 2017-03-05 ENCOUNTER — Other Ambulatory Visit (HOSPITAL_COMMUNITY): Payer: Self-pay | Admitting: Interventional Radiology

## 2017-03-05 DIAGNOSIS — C229 Malignant neoplasm of liver, not specified as primary or secondary: Secondary | ICD-10-CM

## 2017-03-08 ENCOUNTER — Ambulatory Visit (HOSPITAL_COMMUNITY)
Admission: RE | Admit: 2017-03-08 | Discharge: 2017-03-08 | Disposition: A | Discharge status: Home / Self Care | Payer: PPO | Source: Ambulatory Visit | Attending: Interventional Radiology | Admitting: Interventional Radiology

## 2017-03-08 ENCOUNTER — Ambulatory Visit
Admission: RE | Admit: 2017-03-08 | Discharge: 2017-03-08 | Disposition: A | Discharge status: Home / Self Care | Payer: PPO | Source: Ambulatory Visit | Attending: Interventional Radiology | Admitting: Interventional Radiology

## 2017-03-08 ENCOUNTER — Other Ambulatory Visit (HOSPITAL_COMMUNITY): Payer: Self-pay | Admitting: Interventional Radiology

## 2017-03-08 DIAGNOSIS — Z9049 Acquired absence of other specified parts of digestive tract: Secondary | ICD-10-CM | POA: Diagnosis not present

## 2017-03-08 DIAGNOSIS — C229 Malignant neoplasm of liver, not specified as primary or secondary: Secondary | ICD-10-CM | POA: Diagnosis not present

## 2017-03-08 DIAGNOSIS — K8689 Other specified diseases of pancreas: Secondary | ICD-10-CM | POA: Diagnosis not present

## 2017-03-08 DIAGNOSIS — I700 Atherosclerosis of aorta: Secondary | ICD-10-CM | POA: Insufficient documentation

## 2017-03-08 DIAGNOSIS — C221 Intrahepatic bile duct carcinoma: Secondary | ICD-10-CM | POA: Diagnosis not present

## 2017-03-08 DIAGNOSIS — R935 Abnormal findings on diagnostic imaging of other abdominal regions, including retroperitoneum: Secondary | ICD-10-CM | POA: Diagnosis not present

## 2017-03-08 HISTORY — PX: IR RADIOLOGIST EVAL & MGMT: IMG5224

## 2017-03-08 LAB — POCT I-STAT CREATININE: Creatinine, Ser: 0.6 mg/dL (ref 0.44–1.00)

## 2017-03-08 MED ORDER — GADOBENATE DIMEGLUMINE 529 MG/ML IV SOLN
15.0000 mL | Freq: Once | INTRAVENOUS | Status: AC | PRN
  Administered 2017-03-08: 12 mL via INTRAVENOUS

## 2017-03-08 NOTE — Progress Notes (Signed)
Patient ID: [name], female   DOB: 10-18-1941, 76 y.o.   MRN: [id number]       Chief Complaint: Status post right intrahepatic cholangiocarcinoma microwave ablation.  Referring Physician(s): Sherrill  History of Present Illness: Laura [name] is a 76 y.o. female who was diagnosed with a right intrahepatic cholangiocarcinoma. She underwent successful image guided microwave ablation 4 months ago. She returns for outpatient follow-up. No significant interval abdominal pain, flank pain, or fevers. Overall she has recovered very well. Follow-up MRI performed today. She returns to review outpatient imaging with her daughter.  Past Medical History:  Diagnosis Date  . Cancer ([place])   . GERD (gastroesophageal reflux disease)   . Heart murmur   . High cholesterol   . Hypertension   . IBS (irritable bowel syndrome)   . PONV (postoperative nausea and vomiting)     Past Surgical History:  Procedure Laterality Date  . BREAST SURGERY     Benign  . CHOLECYSTECTOMY    . IR GENERIC HISTORICAL  09/27/2016   IR RADIOLOGIST EVAL & MGMT 09/27/2016 Greggory Keen, MD [facility] INTERV RAD  . IR GENERIC HISTORICAL  12/07/2016   IR RADIOLOGIST EVAL & MGMT 12/07/2016 Greggory Keen, MD [facility] INTERV RAD  . TOTAL ABDOMINAL HYSTERECTOMY      Allergies: Pneumococcal vaccines and Codeine  Medications: Prior to Admission medications   Medication Sig Start Date End Date Taking? Authorizing Provider  amLODipine (NORVASC) 5 MG tablet Take 5 mg by mouth daily.  06/22/14  Yes Historical Provider, MD  aspirin EC 81 MG tablet Take 81 mg by mouth daily.   Yes Historical Provider, MD  Biotin 5000 MCG TABS Take 5,000 mcg by mouth every other day.   Yes Historical Provider, MD  Cholecalciferol (VITAMIN D3) 5000 units CAPS Take 5,000 Units by mouth daily.   Yes Historical Provider, MD  clonazePAM (KLONOPIN) 0.5 MG tablet Take 0.25-0.5 mg by mouth. Take 0.25mg s twice daily and 0.5mg s at bedtime, may take an  additional 0.25mg s as needed for anxiety   Yes Historical Provider, MD  doxazosin (CARDURA) 1 MG tablet Take 1 mg by mouth at bedtime.  06/22/14  Yes Historical Provider, MD  omeprazole (PRILOSEC OTC) 20 MG tablet Take 20 mg by mouth daily.   Yes Historical Provider, MD  Jonetta Speak LANCETS [address] [place]  12/09/16  Yes Historical Provider, MD  pravastatin (PRAVACHOL) 40 MG tablet Take 40 mg by mouth every evening.    Yes Historical Provider, MD  tetrahydrozoline 0.05 % ophthalmic solution Place 1 drop into both eyes daily as needed (dry eye).    Yes Historical Provider, MD  docusate sodium (COLACE) 50 MG capsule Take 50 mg by mouth 3 (three) times daily as needed for mild constipation.    Historical Provider, MD     Family History  Problem Relation Age of Onset  . Alzheimer's disease Mother   . Alzheimer's disease Brother   . Alzheimer's disease Sister   . CAD Brother 70    CABG  . Cancer Sister     Breast  . Cancer Brother     Lung    Social History   Social History  . Marital status: Married    Spouse name: Lanae Boast  . Number of children: 3  . Years of education: N/A   Occupational History  . Retired    Social History Main Topics  . Smoking status: Never Smoker  . Smokeless tobacco: Never Used  . Alcohol use No  .  Drug use: No  . Sexual activity: Not on file   Other Topics Concern  . Not on file   Social History Narrative   Lives at home with husband, Lanae Boast who is in poor health (she is caregiver)   Has #3 daughters   Retired from working at [employer]   Has total of #7 siblings    ECOG Status: 1 - Symptomatic but completely ambulatory  Review of Systems: A 12 point ROS discussed and pertinent positives are indicated in the HPI above.  All other systems are negative.  Review of Systems  Vital Signs: BP (!) 152/58 (BP Location: Left Arm, Patient Position: Sitting, Cuff Size: Normal)   Pulse 69   Temp 97.2 F (36.2 C) (Oral)   Resp 14   Ht 5\' 5"   (1.651 m)   Wt 136 lb (61.7 kg)   SpO2 98%   BMI 22.63 kg/m   Physical Exam  Constitutional: She is oriented to person, place, and time. She appears well-developed and well-nourished. No distress.  Elderly female in no distress.  Eyes: Conjunctivae are normal. No scleral icterus.  Cardiovascular: Normal rate, regular rhythm and normal heart sounds.   Pulmonary/Chest: Effort normal and breath sounds normal.  Abdominal: Soft. Bowel sounds are normal. She exhibits no distension and no mass. There is no tenderness. There is no guarding.  Musculoskeletal: Normal range of motion.  Neurological: She is alert and oriented to person, place, and time.  Skin: She is not diaphoretic.  Psychiatric: She has a normal mood and affect. Her behavior is normal. Thought content normal.   Imaging: Mr Abdomen Wwo Contrast  Result Date: 03/08/2017 CLINICAL DATA:  76 year old female with history of right-sided intrahepatic cholangiocarcinoma status post microwave ablation in November 2017. Followup study. EXAM: MRI ABDOMEN WITHOUT AND WITH CONTRAST TECHNIQUE: Multiplanar multisequence MR imaging of the abdomen was performed both before and after the administration of intravenous contrast. CONTRAST:  77mL MULTIHANCE GADOBENATE DIMEGLUMINE 529 MG/ML IV SOLN COMPARISON:  Abdominal MRI 06/20/2016. FINDINGS: Lower chest: Unremarkable. Hepatobiliary: In the right lobe of the liver at the site of the previously ablated lesion there is an area of heterogeneous signal intensity which is generally low T1 signal and a combination of low and high T2 signal intensity (predominantly T2 hyperintense along the periphery of the lesion). Post gadolinium images demonstrate no significant enhancement internally within this lesion. There is some mild progressive peripheral enhancement adjacent to the lesion, without definite focal soft tissue nodularity. No new hepatic lesions are noted. MRCP images demonstrate no intrahepatic biliary  ductal dilatation. Status post cholecystectomy. Common bile duct measures 7 mm in the porta hepatis, which is within normal limits for post cholecystectomy patient. No filling defects in the common bile duct to suggest choledocholithiasis. Pancreas: In the inferior aspect of the pancreatic head there is a multilocular lesion which measures 2.0 x 0.9 x 2.2 cm (axial image 40 of series 7 and coronal image 13 of series 9) which has multiple internal septations, some of which demonstrate thin enhancement associated with the walls of the septations, but no definite mural nodularity or other internal enhancement. MRCP images suggest some ductal communication, however, this is not definitive, and there is no associated pancreatic ductal dilatation. No other pancreatic mass. No pancreatic or peripancreatic fluid or inflammatory changes. Spleen:  Unremarkable. Adrenals/Urinary Tract: Multiple T1 hypointense, T2 hyperintense, nonenhancing lesions associated with the renal hila bilaterally, compatible with multiple parapelvic cysts. In addition, there is a 7 mm simple cyst in  the interpolar region of the left kidney. No suspicious renal lesions. No hydroureteronephrosis. Bilateral adrenal glands are normal in appearance. Stomach/Bowel: Visualized portions are unremarkable. Vascular/Lymphatic: Aortic atherosclerosis, without evidence of aneurysm in the abdominal vasculature. No lymphadenopathy noted in the abdomen. Other:  No significant volume of ascites.  No pneumoperitoneum. Musculoskeletal: No aggressive appearing osseous lesions are noted in the visualized portions of the skeleton. IMPRESSION: 1. Post treatment related changes of microwave ablation are noted in the right lobe of the liver at the site of the previously treated cholangiocarcinoma. While there is a small amount of peripheral enhancement adjacent to the treated lesion, this may simply reflect altered perfusion in the adjacent hepatic parenchyma. Today's study  demonstrates no convincing evidence for residual/recurrent disease, however, today's study serves as a baseline for future followup examinations. 2. Multilocular cystic appearing lesion in the inferior aspect of the pancreatic head which currently measures 2.0 x 0.9 x 2.2 cm. This appears slightly larger than the prior examination, and is favored to represent a small side branch intraductal papillary mucinous neoplasm (IPMN). At this time, there is no main pancreatic ductal communication, although there may be communication with a side branch. No ductal dilatation is noted at this time. Attention at time of repeat abdominal MRI with and without IV gadolinium with MRCP is recommended in 6 months to assess for stability or further growth. This recommendation follows ACR consensus guidelines: Management of Incidental Pancreatic Cysts: A White Paper of the ACR Incidental Findings Committee. [place] [id number];03:[id number]. 3. Additional incidental findings, as above. Electronically Signed   By: Vinnie Langton M.D.   On: 03/08/2017 09:40   Mr 3d Recon At Scanner  Result Date: 03/08/2017 CLINICAL DATA:  76 year old female with history of right-sided intrahepatic cholangiocarcinoma status post microwave ablation in November 2017. Followup study. EXAM: MRI ABDOMEN WITHOUT AND WITH CONTRAST TECHNIQUE: Multiplanar multisequence MR imaging of the abdomen was performed both before and after the administration of intravenous contrast. CONTRAST:  82mL MULTIHANCE GADOBENATE DIMEGLUMINE 529 MG/ML IV SOLN COMPARISON:  Abdominal MRI 06/20/2016. FINDINGS: Lower chest: Unremarkable. Hepatobiliary: In the right lobe of the liver at the site of the previously ablated lesion there is an area of heterogeneous signal intensity which is generally low T1 signal and a combination of low and high T2 signal intensity (predominantly T2 hyperintense along the periphery of the lesion). Post gadolinium images demonstrate no significant  enhancement internally within this lesion. There is some mild progressive peripheral enhancement adjacent to the lesion, without definite focal soft tissue nodularity. No new hepatic lesions are noted. MRCP images demonstrate no intrahepatic biliary ductal dilatation. Status post cholecystectomy. Common bile duct measures 7 mm in the porta hepatis, which is within normal limits for post cholecystectomy patient. No filling defects in the common bile duct to suggest choledocholithiasis. Pancreas: In the inferior aspect of the pancreatic head there is a multilocular lesion which measures 2.0 x 0.9 x 2.2 cm (axial image 40 of series 7 and coronal image 13 of series 9) which has multiple internal septations, some of which demonstrate thin enhancement associated with the walls of the septations, but no definite mural nodularity or other internal enhancement. MRCP images suggest some ductal communication, however, this is not definitive, and there is no associated pancreatic ductal dilatation. No other pancreatic mass. No pancreatic or peripancreatic fluid or inflammatory changes. Spleen:  Unremarkable. Adrenals/Urinary Tract: Multiple T1 hypointense, T2 hyperintense, nonenhancing lesions associated with the renal hila bilaterally, compatible with multiple parapelvic cysts. In addition,  there is a 7 mm simple cyst in the interpolar region of the left kidney. No suspicious renal lesions. No hydroureteronephrosis. Bilateral adrenal glands are normal in appearance. Stomach/Bowel: Visualized portions are unremarkable. Vascular/Lymphatic: Aortic atherosclerosis, without evidence of aneurysm in the abdominal vasculature. No lymphadenopathy noted in the abdomen. Other:  No significant volume of ascites.  No pneumoperitoneum. Musculoskeletal: No aggressive appearing osseous lesions are noted in the visualized portions of the skeleton. IMPRESSION: 1. Post treatment related changes of microwave ablation are noted in the right lobe  of the liver at the site of the previously treated cholangiocarcinoma. While there is a small amount of peripheral enhancement adjacent to the treated lesion, this may simply reflect altered perfusion in the adjacent hepatic parenchyma. Today's study demonstrates no convincing evidence for residual/recurrent disease, however, today's study serves as a baseline for future followup examinations. 2. Multilocular cystic appearing lesion in the inferior aspect of the pancreatic head which currently measures 2.0 x 0.9 x 2.2 cm. This appears slightly larger than the prior examination, and is favored to represent a small side branch intraductal papillary mucinous neoplasm (IPMN). At this time, there is no main pancreatic ductal communication, although there may be communication with a side branch. No ductal dilatation is noted at this time. Attention at time of repeat abdominal MRI with and without IV gadolinium with MRCP is recommended in 6 months to assess for stability or further growth. This recommendation follows ACR consensus guidelines: Management of Incidental Pancreatic Cysts: A White Paper of the ACR Incidental Findings Committee. [place] 2423;[id number]. 3. Additional incidental findings, as above. Electronically Signed   By: Vinnie Langton M.D.   On: 03/08/2017 09:40    Labs:  CBC:  Recent Labs  06/17/16 1443 07/10/16 1210 11/01/16 0830 11/04/16 0403  WBC 3.9* 4.7 4.1 5.5  HGB 12.9 12.8 12.0 11.5*  HCT 37.8 38.7 36.8 35.0*  PLT 203 203 241 216    COAGS:  Recent Labs  07/10/16 1210 11/01/16 0830  INR 1.01 0.91  APTT 27  --     BMP:  Recent Labs  06/17/16 1443 11/01/16 0830 03/08/17 0842  NA 140 140  --   K 3.7 3.9  --   CL 106 106  --   CO2 24 28  --   GLUCOSE 113* 114*  --   BUN 14 13  --   CALCIUM 9.7 9.5  --   CREATININE 0.63 0.55 0.60  GFRNONAA >60 >60  --   GFRAA >60 >60  --     LIVER FUNCTION TESTS:  Recent Labs  06/17/16 1443 11/01/16 0830    BILITOT 0.6 0.6  AST 19 19  ALT 14 11*  ALKPHOS 88 107  PROT 7.8 7.2  ALBUMIN 4.7 4.2    TUMOR MARKERS: No results for input(s): AFPTM, CEA, CA199, CHROMGRNA in the last 8760 hours.  Assessment and Plan:  4 months post ablation MRI with contrast confirms a new ablation defect at the site of the original right inferior intrahepatic cholangiocarcinoma. No convincing evidence of residual or recurrent disease. No new hepatic lesions. No ascites. Negative for adenopathy.  Imaging findings were reviewed with her today and her daughter. All questions were addressed.  Plan: Continue close surveillance. Repeat abdominal MRI with contrast in 3 months.   Electronically Signed: Greggory Keen 03/08/2017, 11:41 AM   I spent a total of    25 Minutes in face to face in clinical consultation, greater than 50% of which was counseling/coordinating  care for this patient with intrahepatic cholangiocarcinoma, status post ablation.

## 2017-03-19 DIAGNOSIS — B029 Zoster without complications: Secondary | ICD-10-CM | POA: Diagnosis not present

## 2017-03-29 ENCOUNTER — Ambulatory Visit (HOSPITAL_BASED_OUTPATIENT_CLINIC_OR_DEPARTMENT_OTHER): Payer: PPO | Admitting: Oncology

## 2017-03-29 VITALS — BP 134/73 | HR 67 | Temp 98.0°F | Resp 18 | Ht 65.0 in | Wt 141.9 lb

## 2017-03-29 DIAGNOSIS — C801 Malignant (primary) neoplasm, unspecified: Secondary | ICD-10-CM

## 2017-03-29 DIAGNOSIS — C787 Secondary malignant neoplasm of liver and intrahepatic bile duct: Secondary | ICD-10-CM

## 2017-03-29 DIAGNOSIS — B029 Zoster without complications: Secondary | ICD-10-CM | POA: Diagnosis not present

## 2017-03-29 NOTE — Progress Notes (Signed)
  [name] OFFICE PROGRESS NOTE   Diagnosis: Cholangiocarcinoma  INTERVAL HISTORY:   Laura [name] returns as scheduled. She feels well. Good appetite. No abdominal pain. She developed a zoster outbreak at the left posterior neck a few weeks ago. The rash is "crusting "over. She underwent a restaging MRI of the abdomen on 03/08/2017. There was no evidence of residual/recurrent disease at the liver ablation site. A multilobular cystic lesion at the pancreas head is slightly larger is favored to represent a side branch intraductal papillary mucinous neoplasm.   Objective:  Vital signs in last 24 hours:  Blood pressure 134/73, pulse 67, temperature 98 F (36.7 C), temperature source Oral, resp. rate 18, height 5\' 5"  (1.651 m), weight 141 lb 14.4 oz (64.4 kg), SpO2 100 %.    HEENT: Neck without mass Lymphatics: Pea-sized left low posterior cervical and scalene nodes,? Pea-sized right scalene node Resp: Lungs clear bilaterally Cardio: Regular rate and rhythm GI: No hepatosplenomegaly, no mass, nontender Vascular: No leg edema  Skin: Resolving zoster rash at the left low posterior neck    Medications: I have reviewed the patient's current medications.  Assessment/Plan: 1. Right liver mass-biopsy 07/10/2016 confirmed adenocarcinoma  CT abdomen/pelvis 06/17/2016 and MRI abdomen 06/21/2016 confirmed an isolated right liver mass and a cystic pancreas lesion  PET scan 07/28/2016-the hepatic lesion is not hypermetabolic, hypermetabolic lesion in the gastric antrum suspicious for a primary neoplasm and no other evidence of metastatic disease  Upper endoscopy 08/03/2016-no mass found, biopsy from the gastric antrum-benign  CT-guided ablation of the right liver lesion 11/03/2016  MRI abdomen 03/08/2017-ablation defect in the right liver without residual/recurrent disease 2. Left leg and right foot pain  Negative left leg Doppler 07/14/2016  3. Right abdominal  pain  4. Anorexia/weight loss  5. Benign appearing 16 mm cystic uncinatepancreas lesion noted on MRI 06/20/2016   Slightly larger on an MRI 03/08/2017, felt to most likely represent a side branch intraductal papillary mucinous neoplasm     Disposition:  Laura [name] appears well. There is no clinical or x-ray evidence for progression of the cholangiocarcinoma. She will undergo surveillance imaging as to rectum by Dr. Reesa Chew. She has a resolving zoster rash at the left posterior neck. The small neck lymph nodes may be related to the zoster rash.  She will return for an office visit in 4 months.  15 minutes were spent with the patient today. The majority of the time was used for counseling and coordination of care.  Laura Coder, MD  03/29/2017  3:24 PM

## 2017-04-02 ENCOUNTER — Telehealth: Payer: Self-pay | Admitting: Oncology

## 2017-04-02 NOTE — Telephone Encounter (Signed)
Patient called and said that Dr Benay Spice wanted to see her on the 8 of May and she was calling to get that schedule she perfers morning

## 2017-04-03 ENCOUNTER — Telehealth: Payer: Self-pay | Admitting: Oncology

## 2017-04-03 NOTE — Telephone Encounter (Signed)
Follow up appointment scheduled for 08/03/17, per 03/29/17 los. Appointment confirmed with patient.

## 2017-05-07 DIAGNOSIS — Z6823 Body mass index (BMI) 23.0-23.9, adult: Secondary | ICD-10-CM | POA: Diagnosis not present

## 2017-05-07 DIAGNOSIS — L309 Dermatitis, unspecified: Secondary | ICD-10-CM | POA: Diagnosis not present

## 2017-05-07 DIAGNOSIS — B029 Zoster without complications: Secondary | ICD-10-CM | POA: Diagnosis not present

## 2017-05-28 ENCOUNTER — Other Ambulatory Visit (HOSPITAL_COMMUNITY): Payer: Self-pay | Admitting: Interventional Radiology

## 2017-05-28 ENCOUNTER — Other Ambulatory Visit: Payer: Self-pay | Admitting: *Deleted

## 2017-05-28 DIAGNOSIS — C220 Liver cell carcinoma: Secondary | ICD-10-CM

## 2017-05-28 DIAGNOSIS — C229 Malignant neoplasm of liver, not specified as primary or secondary: Secondary | ICD-10-CM

## 2017-06-11 ENCOUNTER — Encounter: Payer: Self-pay | Admitting: Interventional Radiology

## 2017-06-11 ENCOUNTER — Other Ambulatory Visit (HOSPITAL_COMMUNITY): Payer: Self-pay | Admitting: Interventional Radiology

## 2017-06-11 ENCOUNTER — Ambulatory Visit (HOSPITAL_COMMUNITY)
Admission: RE | Admit: 2017-06-11 | Discharge: 2017-06-11 | Disposition: A | Discharge status: Home / Self Care | Payer: PPO | Source: Ambulatory Visit | Attending: Interventional Radiology | Admitting: Interventional Radiology

## 2017-06-11 DIAGNOSIS — C221 Intrahepatic bile duct carcinoma: Secondary | ICD-10-CM | POA: Diagnosis not present

## 2017-06-11 DIAGNOSIS — C229 Malignant neoplasm of liver, not specified as primary or secondary: Secondary | ICD-10-CM | POA: Insufficient documentation

## 2017-06-11 LAB — POCT I-STAT CREATININE: Creatinine, Ser: 0.6 mg/dL (ref 0.44–1.00)

## 2017-06-11 MED ORDER — GADOBENATE DIMEGLUMINE 529 MG/ML IV SOLN
15.0000 mL | Freq: Once | INTRAVENOUS | Status: AC | PRN
  Administered 2017-06-11: 13 mL via INTRAVENOUS

## 2017-06-14 ENCOUNTER — Ambulatory Visit
Admission: RE | Admit: 2017-06-14 | Discharge: 2017-06-14 | Disposition: A | Discharge status: Home / Self Care | Payer: PPO | Source: Ambulatory Visit | Attending: Interventional Radiology | Admitting: Interventional Radiology

## 2017-06-14 DIAGNOSIS — C221 Intrahepatic bile duct carcinoma: Secondary | ICD-10-CM | POA: Diagnosis not present

## 2017-06-14 DIAGNOSIS — C229 Malignant neoplasm of liver, not specified as primary or secondary: Secondary | ICD-10-CM

## 2017-06-14 HISTORY — PX: IR RADIOLOGIST EVAL & MGMT: IMG5224

## 2017-06-14 NOTE — Progress Notes (Signed)
Patient ID: Laura [name], female   DOB: 1941/12/23, 75 y.o.   MRN: [id number]       Chief Complaint:  Right hepatic cholangiocarcinoma, 7 months status post microwave ablation.   Referring Physician(s): Benay Spice, brad  History of Present Illness: Laura [name] is a 76 y.o. female with right hepatic cholangiocarcinoma. She has now 7 months status post microwave ablation. She returns for outpatient follow-up and review of her surveillance MRI. Overall she is doing very well clinically. No physical limitations. She continues to work in her yard. She describes minor mild right upper quadrant intermittent discomfort, nonspecific. No significant abdominal pain or flank pain. No signs of jaundice. No interval fevers or signs of infection. She reports slight weight gain and a good appetite.  Past Medical History:  Diagnosis Date  . Cancer ([place])   . GERD (gastroesophageal reflux disease)   . Heart murmur   . High cholesterol   . Hypertension   . IBS (irritable bowel syndrome)   . PONV (postoperative nausea and vomiting)     Past Surgical History:  Procedure Laterality Date  . BREAST SURGERY     Benign  . CHOLECYSTECTOMY    . IR GENERIC HISTORICAL  09/27/2016   IR RADIOLOGIST EVAL & MGMT 09/27/2016 Greggory Keen, MD [facility] INTERV RAD  . IR GENERIC HISTORICAL  12/07/2016   IR RADIOLOGIST EVAL & MGMT 12/07/2016 Greggory Keen, MD [facility] INTERV RAD  . IR RADIOLOGIST EVAL & MGMT  03/08/2017  . TOTAL ABDOMINAL HYSTERECTOMY      Allergies: Pneumococcal vaccines and Codeine  Medications: Prior to Admission medications   Medication Sig Start Date End Date Taking? Authorizing Provider  amLODipine (NORVASC) 5 MG tablet Take 5 mg by mouth daily.  06/22/14  Yes [provider]  aspirin EC 81 MG tablet Take 81 mg by mouth daily.   Yes [provider]  Biotin 5000 MCG TABS Take 5,000 mcg by mouth every other day.   Yes [provider]  Cholecalciferol (VITAMIN D3)  5000 units CAPS Take 5,000 Units by mouth daily.   Yes [provider]  clonazePAM (KLONOPIN) 0.5 MG tablet Take 0.25-0.5 mg by mouth. Take 0.25mg s twice daily and 0.5mg s at bedtime, may take an additional 0.25mg s as needed for anxiety   Yes [provider]  docusate sodium (COLACE) 50 MG capsule Take 50 mg by mouth 3 (three) times daily as needed for mild constipation.   Yes [provider]  doxazosin (CARDURA) 1 MG tablet Take 1 mg by mouth at bedtime.  06/22/14  Yes [provider]  omeprazole (PRILOSEC OTC) 20 MG tablet Take 20 mg by mouth daily.   Yes [provider]  Jonetta Speak LANCETS 91Q [place]  12/09/16  Yes [provider]  pravastatin (PRAVACHOL) 40 MG tablet Take 40 mg by mouth every evening.    Yes [provider]  tetrahydrozoline 0.05 % ophthalmic solution Place 1 drop into both eyes daily as needed (dry eye).    Yes [provider]     Family History  Problem Relation Age of Onset  . Alzheimer's disease Mother   . Alzheimer's disease Brother   . Alzheimer's disease Sister   . CAD Brother 24       CABG  . Cancer Sister        Breast  . Cancer Brother        Lung    Social History   Social History  . Marital status: Widowed  Spouse name: Lanae Boast  . Number of children: 3  . Years of education: N/A   Occupational History  . Retired    Social History Main Topics  . Smoking status: Never Smoker  . Smokeless tobacco: Never Used  . Alcohol use No  . Drug use: No  . Sexual activity: Not on file   Other Topics Concern  . Not on file   Social History Narrative   Lives at home with husband, Lanae Boast who is in poor health (she is caregiver)   Has #3 daughters   Retired from working at [employer]   Has total of #7 siblings    ECOG Status: 1 - Symptomatic but completely ambulatory  Review of Systems: A 12 point ROS discussed and pertinent positives are indicated in the HPI above.   All other systems are negative.  Review of Systems  Vital Signs: BP 134/60   Pulse 68   Temp 97.7 F (36.5 C) (Oral)   Resp 14   Ht 5\' 5"  (1.651 m)   Wt 142 lb (64.4 kg)   SpO2 98%   BMI 23.63 kg/m   Physical Exam  Constitutional: She is oriented to person, place, and time. She appears well-developed and well-nourished. No distress.  Eyes: Conjunctivae are normal. No scleral icterus.  Cardiovascular: Normal rate and regular rhythm.   No murmur heard. Pulmonary/Chest: Effort normal and breath sounds normal. No respiratory distress.  Abdominal: Soft. Bowel sounds are normal. She exhibits no distension.  Musculoskeletal: She exhibits no edema or tenderness.  Neurological: She is alert and oriented to person, place, and time.  Skin: Skin is warm and dry. She is not diaphoretic. No erythema.  Psychiatric: She has a normal mood and affect. Her behavior is normal.    Mallampati Score:   1  Imaging: Mr Abdomen Wwo Contrast  Result Date: 06/11/2017 CLINICAL DATA:  Intrahepatic cholangiocarcinoma status post microwave ablation in November 2017. EXAM: MRI ABDOMEN WITHOUT AND WITH CONTRAST (INCLUDING MRCP) TECHNIQUE: Multiplanar multisequence MR imaging of the abdomen was performed both before and after the administration of intravenous contrast. Heavily T2-weighted images of the biliary and pancreatic ducts were obtained, and three-dimensional MRCP images were rendered by post processing. CONTRAST:  3mL MULTIHANCE GADOBENATE DIMEGLUMINE 529 MG/ML IV SOLN COMPARISON:  CT 06/17/2016, PET-CT 07/28/2016 and MRI 03/08/2017. FINDINGS: Lower chest:  The visualized lower chest appears unremarkable. Hepatobiliary: Further contraction of the ablation defect inferiorly in the right hepatic lobe. There is no abnormal enhancement associated directly with this lesion. However, there are multiple new surrounding foci of low T2 signal, restricted diffusion and ring enhancement consistent with local  recurrence of tumor or new metastases. The individual lesions are best seen on the T2 weighted images (series 3), of pre contrast T1 weighted images and early postcontrast images. The lesions all appear to be within segment 6, and some are collapsing. As measured on series 3, individual lesions include a 10 mm lesion on image 24, a 13 x 20 mm lesion on image 27 and a 13 x 16 mm lesion on image 29. Status post cholecystectomy. No biliary dilatation. Pancreas: The multi-septated cystic lesion inferiorly in the pancreatic head is grossly stable, measuring approximately 12 x 19 x 18 mm. As before, there is possible communication with the ventral pancreatic duct which is not dilated. There is stable thin septal enhancement of this lesion, but no solid components. The pancreas otherwise appears unremarkable. Spleen: Normal in size without focal abnormality. Adrenals/Urinary Tract: Both adrenal glands  appear normal. Multiple renal sinus cysts are again noted bilaterally. There is a stable small cortical cyst in the interpolar region of the left kidney. No hydronephrosis or suspicious renal finding. Stomach/Bowel: No evidence of bowel wall thickening, distention or surrounding inflammatory change. Vascular/Lymphatic: There are no enlarged abdominal lymph nodes. Stable small lymph nodes in the gastrohepatic ligament. No acute vascular findings. Other: No ascites or peritoneal nodularity. Musculoskeletal: No acute or significant osseous findings. IMPRESSION: 1. There are multiple new enhancing lesions inferiorly in the right hepatic lobe around the ablation defect consistent with progressive metastatic disease or local recurrence of cholangiocarcinoma. 2. No distant metastases identified. 3. Stable multi-septated cystic pancreatic lesion, again likely a side-branch intraductal papillary mucinous neoplasm and not likely to be clinically significant given the hepatic disease. The stability of this lesion can be addressed on  follow-up imaging of the patient's hepatic disease. Electronically Signed   By: Richardean Sale M.D.   On: 06/11/2017 09:40   Mr 3d Recon At Scanner  Result Date: 06/11/2017 CLINICAL DATA:  Intrahepatic cholangiocarcinoma status post microwave ablation in November 2017. EXAM: MRI ABDOMEN WITHOUT AND WITH CONTRAST (INCLUDING MRCP) TECHNIQUE: Multiplanar multisequence MR imaging of the abdomen was performed both before and after the administration of intravenous contrast. Heavily T2-weighted images of the biliary and pancreatic ducts were obtained, and three-dimensional MRCP images were rendered by post processing. CONTRAST:  56mL MULTIHANCE GADOBENATE DIMEGLUMINE 529 MG/ML IV SOLN COMPARISON:  CT 06/17/2016, PET-CT 07/28/2016 and MRI 03/08/2017. FINDINGS: Lower chest:  The visualized lower chest appears unremarkable. Hepatobiliary: Further contraction of the ablation defect inferiorly in the right hepatic lobe. There is no abnormal enhancement associated directly with this lesion. However, there are multiple new surrounding foci of low T2 signal, restricted diffusion and ring enhancement consistent with local recurrence of tumor or new metastases. The individual lesions are best seen on the T2 weighted images (series 3), of pre contrast T1 weighted images and early postcontrast images. The lesions all appear to be within segment 6, and some are collapsing. As measured on series 3, individual lesions include a 10 mm lesion on image 24, a 13 x 20 mm lesion on image 27 and a 13 x 16 mm lesion on image 29. Status post cholecystectomy. No biliary dilatation. Pancreas: The multi-septated cystic lesion inferiorly in the pancreatic head is grossly stable, measuring approximately 12 x 19 x 18 mm. As before, there is possible communication with the ventral pancreatic duct which is not dilated. There is stable thin septal enhancement of this lesion, but no solid components. The pancreas otherwise appears unremarkable.  Spleen: Normal in size without focal abnormality. Adrenals/Urinary Tract: Both adrenal glands appear normal. Multiple renal sinus cysts are again noted bilaterally. There is a stable small cortical cyst in the interpolar region of the left kidney. No hydronephrosis or suspicious renal finding. Stomach/Bowel: No evidence of bowel wall thickening, distention or surrounding inflammatory change. Vascular/Lymphatic: There are no enlarged abdominal lymph nodes. Stable small lymph nodes in the gastrohepatic ligament. No acute vascular findings. Other: No ascites or peritoneal nodularity. Musculoskeletal: No acute or significant osseous findings. IMPRESSION: 1. There are multiple new enhancing lesions inferiorly in the right hepatic lobe around the ablation defect consistent with progressive metastatic disease or local recurrence of cholangiocarcinoma. 2. No distant metastases identified. 3. Stable multi-septated cystic pancreatic lesion, again likely a side-branch intraductal papillary mucinous neoplasm and not likely to be clinically significant given the hepatic disease. The stability of this lesion can be addressed on follow-up  imaging of the patient's hepatic disease. Electronically Signed   By: Richardean Sale M.D.   On: 06/11/2017 09:40    Labs:  CBC:  Recent Labs  06/17/16 1443 07/10/16 1210 11/01/16 0830 11/04/16 0403  WBC 3.9* 4.7 4.1 5.5  HGB 12.9 12.8 12.0 11.5*  HCT 37.8 38.7 36.8 35.0*  PLT 203 203 241 216    COAGS:  Recent Labs  07/10/16 1210 11/01/16 0830  INR 1.01 0.91  APTT 27  --     BMP:  Recent Labs  06/17/16 1443 11/01/16 0830 03/08/17 0842 06/11/17 0824  NA 140 140  --   --   K 3.7 3.9  --   --   CL 106 106  --   --   CO2 24 28  --   --   GLUCOSE 113* 114*  --   --   BUN 14 13  --   --   CALCIUM 9.7 9.5  --   --   CREATININE 0.63 0.55 0.60 0.60  GFRNONAA >60 >60  --   --   GFRAA >60 >60  --   --     LIVER FUNCTION TESTS:  Recent Labs  06/17/16 1443  11/01/16 0830  BILITOT 0.6 0.6  AST 19 19  ALT 14 11*  ALKPHOS 88 107  PROT 7.8 7.2  ALBUMIN 4.7 4.2    TUMOR MARKERS: No results for input(s): AFPTM, CEA, CA199, CHROMGRNA in the last 8760 hours.  Assessment and Plan:  7 months status post microwave ablation of the right hepatic cholangiocarcinoma.   surveillance MRI this week demonstrates small nodular multifocal areas of ring enhancement about the ablation defect in segment 6 of the liver compatible with interval progression confined to this area of the liver. No evidence of distant metastases. No biliary dilatation.  portal vein remains patent.  Clinically she continues to do very well and remains asymptomatic. She has an excellent functional status. Treatment options were reviewed including Y 90 radio embolization which I think would be beneficial at this time for local palliative control of growth. The procedure, risks, benefits and alternatives were reviewed. She has a clear understanding of the procedure. All questions were addressed. After our discussion, she would like to proceed with scheduling the Y 90 radio embolization procedure in the next few weeks at [facility].    Electronically Signed: Greggory Keen 06/14/2017, 8:50 AM   I spent a total of    40 Minutes in face to face in clinical consultation, greater than 50% of which was counseling/coordinating care for this patient with right intrahepatic cholangiocarcinoma.

## 2017-06-18 ENCOUNTER — Telehealth: Payer: Self-pay | Admitting: *Deleted

## 2017-06-18 NOTE — Telephone Encounter (Signed)
Message from pt reporting she needs to have a procedure done for liver tumors, asks if her low WBC will need to be addressed prior to procedure.  Pt is being set up for Y90. No recent CBC noted in EMR. Will follow up with PCP office for recent lab.

## 2017-06-19 ENCOUNTER — Other Ambulatory Visit (HOSPITAL_COMMUNITY): Payer: Self-pay | Admitting: Interventional Radiology

## 2017-06-19 ENCOUNTER — Telehealth: Payer: Self-pay | Admitting: *Deleted

## 2017-06-19 DIAGNOSIS — C229 Malignant neoplasm of liver, not specified as primary or secondary: Secondary | ICD-10-CM

## 2017-06-19 NOTE — Telephone Encounter (Signed)
Informed pt that I've placed lab on desk for MD review. She confirmed last labs were in January.

## 2017-06-19 NOTE — Telephone Encounter (Signed)
Called Dr Loren Racer office, pt's most recent labs there were done in Jan. They will fax them over.

## 2017-06-22 ENCOUNTER — Telehealth: Payer: Self-pay | Admitting: *Deleted

## 2017-06-22 NOTE — Telephone Encounter (Signed)
Spoke with pt's daughter, informed her Dr. Benay Spice has reviewed CBC: mild decrease in neutrophils, likely benign. OK to proceed with Y90 unless IR recommends repeating CBC. Will check CBC with office visit on 8/10. Instructed her to check in at 0745 that day. She voiced understanding.

## 2017-06-29 ENCOUNTER — Other Ambulatory Visit (HOSPITAL_COMMUNITY): Payer: Self-pay | Admitting: Interventional Radiology

## 2017-06-29 DIAGNOSIS — C229 Malignant neoplasm of liver, not specified as primary or secondary: Secondary | ICD-10-CM

## 2017-07-04 ENCOUNTER — Other Ambulatory Visit: Payer: Self-pay | Admitting: Radiology

## 2017-07-05 ENCOUNTER — Ambulatory Visit (HOSPITAL_COMMUNITY)
Admission: RE | Admit: 2017-07-05 | Discharge: 2017-07-05 | Disposition: A | Discharge status: Home / Self Care | Payer: PPO | Source: Ambulatory Visit | Attending: Interventional Radiology | Admitting: Interventional Radiology

## 2017-07-05 ENCOUNTER — Other Ambulatory Visit (HOSPITAL_COMMUNITY): Payer: Self-pay | Admitting: Interventional Radiology

## 2017-07-05 ENCOUNTER — Encounter (HOSPITAL_COMMUNITY): Payer: Self-pay

## 2017-07-05 ENCOUNTER — Encounter (HOSPITAL_COMMUNITY)
Admission: RE | Admit: 2017-07-05 | Discharge: 2017-07-05 | Disposition: A | Discharge status: Home / Self Care | Payer: PPO | Source: Ambulatory Visit | Attending: Interventional Radiology | Admitting: Interventional Radiology

## 2017-07-05 DIAGNOSIS — K219 Gastro-esophageal reflux disease without esophagitis: Secondary | ICD-10-CM | POA: Insufficient documentation

## 2017-07-05 DIAGNOSIS — C229 Malignant neoplasm of liver, not specified as primary or secondary: Secondary | ICD-10-CM

## 2017-07-05 DIAGNOSIS — D134 Benign neoplasm of liver: Secondary | ICD-10-CM | POA: Diagnosis not present

## 2017-07-05 DIAGNOSIS — I10 Essential (primary) hypertension: Secondary | ICD-10-CM | POA: Diagnosis not present

## 2017-07-05 DIAGNOSIS — C221 Intrahepatic bile duct carcinoma: Secondary | ICD-10-CM | POA: Insufficient documentation

## 2017-07-05 DIAGNOSIS — E7800 Pure hypercholesterolemia, unspecified: Secondary | ICD-10-CM | POA: Diagnosis not present

## 2017-07-05 DIAGNOSIS — R7303 Prediabetes: Secondary | ICD-10-CM | POA: Diagnosis not present

## 2017-07-05 DIAGNOSIS — Z7982 Long term (current) use of aspirin: Secondary | ICD-10-CM | POA: Diagnosis not present

## 2017-07-05 DIAGNOSIS — K589 Irritable bowel syndrome without diarrhea: Secondary | ICD-10-CM | POA: Insufficient documentation

## 2017-07-05 HISTORY — PX: IR EMBO ARTERIAL NOT HEMORR HEMANG INC GUIDE ROADMAPPING: IMG5448

## 2017-07-05 HISTORY — PX: IR ANGIOGRAM VISCERAL SELECTIVE: IMG657

## 2017-07-05 HISTORY — DX: Prediabetes: R73.03

## 2017-07-05 HISTORY — PX: IR US GUIDE VASC ACCESS RIGHT: IMG2390

## 2017-07-05 HISTORY — PX: IR ANGIOGRAM SELECTIVE EACH ADDITIONAL VESSEL: IMG667

## 2017-07-05 LAB — CBC
HCT: 35 % — ABNORMAL LOW (ref 36.0–46.0)
Hemoglobin: 11.9 g/dL — ABNORMAL LOW (ref 12.0–15.0)
MCH: 28.7 pg (ref 26.0–34.0)
MCHC: 34 g/dL (ref 30.0–36.0)
MCV: 84.5 fL (ref 78.0–100.0)
PLATELETS: 200 10*3/uL (ref 150–400)
RBC: 4.14 MIL/uL (ref 3.87–5.11)
RDW: 12.7 % (ref 11.5–15.5)
WBC: 2.9 10*3/uL — ABNORMAL LOW (ref 4.0–10.5)

## 2017-07-05 LAB — COMPREHENSIVE METABOLIC PANEL
ALBUMIN: 4.2 g/dL (ref 3.5–5.0)
ALK PHOS: 103 U/L (ref 38–126)
ALT: 16 U/L (ref 14–54)
ANION GAP: 6 (ref 5–15)
AST: 22 U/L (ref 15–41)
BILIRUBIN TOTAL: 0.6 mg/dL (ref 0.3–1.2)
BUN: 13 mg/dL (ref 6–20)
CALCIUM: 9.6 mg/dL (ref 8.9–10.3)
CO2: 29 mmol/L (ref 22–32)
Chloride: 105 mmol/L (ref 101–111)
Creatinine, Ser: 0.53 mg/dL (ref 0.44–1.00)
GFR calc Af Amer: >60 mL/min (ref >60)
GFR calc non Af Amer: >60 mL/min (ref >60)
GLUCOSE: 118 mg/dL — AB (ref 65–99)
Potassium: 3.9 mmol/L (ref 3.5–5.1)
Sodium: 140 mmol/L (ref 135–145)
TOTAL PROTEIN: 7.3 g/dL (ref 6.5–8.1)

## 2017-07-05 LAB — PROTIME-INR
INR: 0.95
Prothrombin Time: 12.7 seconds (ref 11.4–15.2)

## 2017-07-05 LAB — APTT: aPTT: 27 seconds (ref 24–36)

## 2017-07-05 MED ORDER — IOPAMIDOL (ISOVUE-300) INJECTION 61%
INTRAVENOUS | Status: AC
  Filled 2017-07-05: qty 200

## 2017-07-05 MED ORDER — MIDAZOLAM HCL 2 MG/2ML IJ SOLN
INTRAMUSCULAR | Status: AC | PRN
  Administered 2017-07-05 (×2): 1 mg via INTRAVENOUS
  Administered 2017-07-05: 0.5 mg via INTRAVENOUS

## 2017-07-05 MED ORDER — LIDOCAINE HCL 1 % IJ SOLN
INTRAMUSCULAR | Status: AC
  Filled 2017-07-05: qty 20

## 2017-07-05 MED ORDER — FENTANYL CITRATE (PF) 100 MCG/2ML IJ SOLN
INTRAMUSCULAR | Status: AC
  Filled 2017-07-05: qty 4

## 2017-07-05 MED ORDER — IOPAMIDOL (ISOVUE-300) INJECTION 61%
100.0000 mL | Freq: Once | INTRAVENOUS | Status: AC | PRN
  Administered 2017-07-05: 77 mL via INTRAVENOUS

## 2017-07-05 MED ORDER — IOPAMIDOL (ISOVUE-300) INJECTION 61%
100.0000 mL | Freq: Once | INTRAVENOUS | Status: AC | PRN
  Administered 2017-07-05: 50 mL via INTRAVENOUS

## 2017-07-05 MED ORDER — LIDOCAINE HCL 1 % IJ SOLN
INTRAMUSCULAR | Status: AC | PRN
  Administered 2017-07-05: 10 mL

## 2017-07-05 MED ORDER — TECHNETIUM TO 99M ALBUMIN AGGREGATED
5.2000 | Freq: Once | INTRAVENOUS | Status: AC | PRN
  Administered 2017-07-05: 5.2 via INTRAVENOUS

## 2017-07-05 MED ORDER — SODIUM CHLORIDE 0.9 % IV SOLN
INTRAVENOUS | Status: AC
  Administered 2017-07-05: 09:00:00 via INTRAVENOUS

## 2017-07-05 MED ORDER — MIDAZOLAM HCL 2 MG/2ML IJ SOLN
INTRAMUSCULAR | Status: AC
  Filled 2017-07-05: qty 6

## 2017-07-05 MED ORDER — FENTANYL CITRATE (PF) 100 MCG/2ML IJ SOLN
INTRAMUSCULAR | Status: AC | PRN
  Administered 2017-07-05: 50 ug via INTRAVENOUS

## 2017-07-05 NOTE — Sedation Documentation (Signed)
Patient is resting comfortably. 

## 2017-07-05 NOTE — Procedures (Signed)
cholangioca  S/p visceral angios and rt gastric embo for pre Y90  Rt hepatic MAA injection for shunt calc  No comp Stable EBL 5cc Full report in PACS

## 2017-07-05 NOTE — H&P (Signed)
Referring Physician(s): Sherrill,B  Supervising Physician: Daryll Brod  Patient Status:  [facility]  Chief Complaint: cholangiocarcinoma   Subjective: Patient familiar to IR service from prior right liver mass biopsy in July 2017 followed by microwave ablation of a right hepatic cholangiocarcinoma in November 2017. Recent surveillance MRI demonstrated  small nodular multifocal areas of ring enhancement about the ablation defect in segment 6 compatible with interval progression confined to this area of the liver. She was seen recently in follow-up by Dr. Annamaria Boots and deemed an appropriate candidate for Y 90 hepatic radioembolization. She presents today for pre-Y 90 arterial roadmapping/test Y-90 dosing. She denies fever, chest pain, dyspnea, cough, abdominal/back pain, nausea, vomiting or abnormal bleeding. She does have occasional headaches.  Past Medical History:  Diagnosis Date  . Cancer ([place])   . GERD (gastroesophageal reflux disease)   . Heart murmur   . High cholesterol   . Hypertension   . IBS (irritable bowel syndrome)   . PONV (postoperative nausea and vomiting)   . Pre-diabetes    Past Surgical History:  Procedure Laterality Date  . BREAST SURGERY     Benign  . CHOLECYSTECTOMY    . IR GENERIC HISTORICAL  09/27/2016   IR RADIOLOGIST EVAL & MGMT 09/27/2016 Greggory Keen, MD [facility] INTERV RAD  . IR GENERIC HISTORICAL  12/07/2016   IR RADIOLOGIST EVAL & MGMT 12/07/2016 Greggory Keen, MD [facility] INTERV RAD  . IR RADIOLOGIST EVAL & MGMT  03/08/2017  . TOTAL ABDOMINAL HYSTERECTOMY       Allergies: Pneumococcal vaccines and Codeine  Medications: Prior to Admission medications   Medication Sig Start Date End Date Taking? Authorizing Provider  amLODipine (NORVASC) 5 MG tablet Take 5 mg by mouth daily.  06/22/14  Yes [provider]  aspirin EC 81 MG tablet Take 81 mg by mouth daily.   Yes [provider]  Biotin 5000 MCG TABS Take 5,000 mcg by mouth every  other day.   Yes [provider]  Cholecalciferol (VITAMIN D3) 5000 units CAPS Take 5,000 Units by mouth daily.   Yes [provider]  clonazePAM (KLONOPIN) 0.5 MG tablet Take 0.25-0.5 mg by mouth. Take 0.25mg s twice daily and 0.5mg s at bedtime, may take an additional 0.25mg s as needed for anxiety   Yes [provider]  docusate sodium (COLACE) 50 MG capsule Take 50 mg by mouth 3 (three) times daily as needed for mild constipation.   Yes [provider]  doxazosin (CARDURA) 1 MG tablet Take 1 mg by mouth at bedtime.  06/22/14  Yes [provider]  omeprazole (PRILOSEC OTC) 20 MG tablet Take 20 mg by mouth daily.   Yes [provider]  pravastatin (PRAVACHOL) 40 MG tablet Take 40 mg by mouth every evening.    Yes [provider]  tetrahydrozoline 0.05 % ophthalmic solution Place 1 drop into both eyes daily as needed (dry eye).    Yes [provider]  Jonetta Speak LANCETS 16X [place]  12/09/16   [provider]     Vital Signs: BP (!) 142/67 (BP Location: Right Arm)   Pulse 74   Temp 97.6 F (36.4 C) (Oral)   Resp 16   Ht 5\' 5"  (1.651 m)   SpO2 98%   Physical Exam awake, alert. Chest clear to auscultation bilaterally. Heart with regular rate and rhythm. Abdomen soft, positive bowel sounds, nontender. No lower extremity edema.  Imaging: No results found.  Labs:  CBC:  Recent Labs  07/10/16  1210 11/01/16 0830 11/04/16 0403 07/05/17 0816  WBC 4.7 4.1 5.5 2.9*  HGB 12.8 12.0 11.5* 11.9*  HCT 38.7 36.8 35.0* 35.0*  PLT 203 241 216 200    COAGS:  Recent Labs  07/10/16 1210 11/01/16 0830 07/05/17 0816  INR 1.01 0.91 0.95  APTT 27  --  27    BMP:  Recent Labs  11/01/16 0830 03/08/17 0842 06/11/17 0824 07/05/17 0816  NA 140  --   --  140  K 3.9  --   --  3.9  CL 106  --   --  105  CO2 28  --   --  29  GLUCOSE 114*  --   --  118*  BUN 13  --   --  13  CALCIUM 9.5  --   --  9.6    CREATININE 0.55 0.60 0.60 0.53  GFRNONAA >60  --   --  >60  GFRAA >60  --   --  >60    LIVER FUNCTION TESTS:  Recent Labs  11/01/16 0830 07/05/17 0816  BILITOT 0.6 0.6  AST 19 22  ALT 11* 16  ALKPHOS 107 103  PROT 7.2 7.3  ALBUMIN 4.2 4.2    Assessment and Plan: Patient with history of segment 6 hepatic cholangiocarcinoma, status post microwave ablation in November 2017. Recent MRI demonstrates small nodular multifocal areas of ring enhancement about the ablation defect in segment 6 of the liver compatible with interval progression confined to this area of the liver. Seen in follow-up by Dr. Annamaria Boots and deemed an appropriate candidate for Y 90 hepatic radioembolization for further treatment/local palliative control. Patient presents today for pre-Y 90 hepatic/visceral arterial roadmapping /test Y 90 dosing. Details/risks of procedure, including but not limited to, internal bleeding, infection, contrast nephropathy, nontarget embolization discussed with patient/daughter with her understanding and consent.   Electronically Signed: D. Rowe Robert, PA-C 07/05/2017, 8:53 AM   I spent a total of 25 minutes  at the the patient's bedside AND on the patient's hospital floor or unit, greater than 50% of which was counseling/coordinating care for visceral/hepatic arteriogram with Y 90 test dosing

## 2017-07-05 NOTE — Discharge Instructions (Addendum)
Post Y-90 Radioembolization Discharge Instructions  You have been given a radioactive material during your procedure.  While it is safe for you to be discharged home from the hospital, you need to proceed directly home.    Do not use public transportation, including air travel, lasting more than 2 hours for 1 week.  Avoid crowded public places for 1 week.  Adult visitors should try to avoid close contact with you for 1 week.    Children and pregnant females should not visit or have close contact with you for 1 week.  Items that you touch are not radioactive.  Do not sleep in the same bed as your partner for 1 week, and a condom should be used for sexual activity during the first 24 hours.  Your blood may be radioactive and caution should be used if any bleeding occurs during the recovery period.  Body fluids may be radioactive for 24 hours.  Wash your hands after voiding.  Men should sit to urinate.  Dispose of any soiled materials (flush down toilet or place in trash at home) during the first day.  Drink 6 to 8 glasses of fluids per day for 5 days to hydrate yourself.  If you need to see a doctor during the first week, you must let them know that you were treated with yttrium-90 microspheres, and will be slightly radioactive.  They can call Interventional Radiology [phone] with any questions.Moderate     Moderate Conscious Sedation, Adult, Care After These instructions provide you with information about caring for yourself after your procedure. Your health care provider may also give you more specific instructions. Your treatment has been planned according to current medical practices, but problems sometimes occur. Call your health care provider if you have any problems or questions after your procedure. What can I expect after the procedure? After your procedure, it is common:  To feel sleepy for several hours.  To feel clumsy and have poor balance for several hours.  To have  poor judgment for several hours.  To vomit if you eat too soon.  Follow these instructions at home: For at least 24 hours after the procedure:   Do not: ? Participate in activities where you could fall or become injured. ? Drive. ? Use heavy machinery. ? Drink alcohol. ? Take sleeping pills or medicines that cause drowsiness. ? Make important decisions or sign legal documents. ? Take care of children on your own.  Rest. Eating and drinking  Follow the diet recommended by your health care provider.  If you vomit: ? Drink water, juice, or soup when you can drink without vomiting. ? Make sure you have little or no nausea before eating solid foods. General instructions  Have a responsible adult stay with you until you are awake and alert.  Take over-the-counter and prescription medicines only as told by your health care provider.  If you smoke, do not smoke without supervision.  Keep all follow-up visits as told by your health care provider. This is important. Contact a health care provider if:  You keep feeling nauseous or you keep vomiting.  You feel light-headed.  You develop a rash.  You have a fever. Get help right away if:  You have trouble breathing. This information is not intended to replace advice given to you by your health care provider. Make sure you discuss any questions you have with your health care provider. Document Released: 10/01/2013 Document Revised: 05/15/2016 Document Reviewed: 04/01/2016 Elsevier Interactive Patient Education  2018 [employer]   Hepatic Artery Radioembolization, Care After This sheet gives you information about how to care for yourself after your procedure. Your health care provider may also give you more specific instructions. If you have problems or questions, contact your health care provider. What can I expect after the procedure? After the procedure, it is common to have:  A slight fever for 1-2 weeks. If your fever  gets worse, tell your health care provider.  Fatigue.  Loss of appetite. This should gradually improve after about 1 week.  Abdominal pain on your right side.  Soreness and tenderness in your groin area where the needle and catheter were placed (puncture site).  Follow these instructions at home: Puncture site care  Follow instructions from your health care provider about how to take care of the puncture site. Make sure you: ? Wash your hands with soap and water before you change your bandage (dressing). If soap and water are not available, use hand sanitizer. ? Remove your dressing tomorrow.  Check your puncture site every day for signs of infection. Check for: ? More redness, swelling, or pain. ? More fluid or blood. ? Warmth. ? Pus or a bad smell. Activity  Rest and return to your normal activities as told by your health care provider. Ask your health care provider what activities are safe for you.  Do not drive for 24 hours after the procedure if you were given a medicine to help you relax (sedative).  Do not lift anything that is heavier than 10 lb (4.5 kg) until your health care provider says that it is safe. Medicines  Take over-the-counter and prescription medicines only as told by your health care provider.  Do not drive or use heavy machinery while taking prescription pain medicine. Radiation precautions  For up to a week after your procedure, there will be a small amount of radioactivity near your liver. This is not especially dangerous to other people. However, you should follow these precautions for 7 days: ? Do not come in close contact with people. ? Do not sleep in the same bed as someone else. ? Do not hold children or babies. ? Do not have contact with pregnant women. General instructions  To prevent or treat constipation while you are taking prescription pain medicine, your health care provider may recommend that you: ? Drink enough fluid to keep your  urine clear or pale yellow. ? Take over-the-counter or prescription medicines. ? Eat foods that are high in fiber, such as fresh fruits and vegetables, whole grains, and beans. ? Limit foods that are high in fat and processed sugars, such as fried and sweet foods.  Eat frequent small meals until your appetite returns. Follow instructions from your health care provider about eating or drinking restrictions.  Do not take baths, swim, or use a hot tub until your health care provider approves.   You may take a shower tomorrow. Wash your puncture site with mild soap and water and pat the area dry.  Keep all follow-up visits as told by your health care provider. This is important. You may need to have blood tests and imaging tests done. Contact a health care provider if:  You have more redness, swelling, or pain around your puncture site.  You have more fluid or blood coming from your puncture site.  Your puncture site feels warm to the touch.  You have pus or a bad smell coming from your puncture site.  You have pain that: ?  Gets worse. ? Does not get better with medicine. ? Feels like very bad heartburn. ? Is in the middle of your abdomen, above your belly button.  Your skin and the white parts of your eyes turn yellow (jaundice).  The color of your urine changes to dark brown.  The color of your stool changes to light yellow.  Your abdominal measurement (girth) increases in a short period of time.  You gain more than 5 lb (2.3 kg) in a short period of time. Get help right away if:  You have a fever that lasts longer than 2 weeks or is higher than what your health care provider told you to expect.  You develop any of the following in your legs: ? Pain. ? Swelling. ? Skin that is cold or pale or turns blue.  You have chest pain.  You have blood in your vomit, saliva, or stool.  You have trouble breathing. This information is not intended to replace advice given to you by  your health care provider. Make sure you discuss any questions you have with your health care provider. Document Released: 12/16/2013 Document Revised: 09/09/2016 Document Reviewed: 09/09/2016 Elsevier Interactive Patient Education  [employer].

## 2017-07-05 NOTE — Sedation Documentation (Signed)
Patient denies pain and is resting comfortably.  

## 2017-07-19 ENCOUNTER — Encounter (HOSPITAL_COMMUNITY): Payer: Self-pay

## 2017-07-19 ENCOUNTER — Ambulatory Visit (HOSPITAL_COMMUNITY)
Admission: RE | Admit: 2017-07-19 | Discharge: 2017-07-19 | Disposition: A | Discharge status: Home / Self Care | Payer: PPO | Source: Ambulatory Visit | Attending: Interventional Radiology | Admitting: Interventional Radiology

## 2017-07-19 ENCOUNTER — Encounter (HOSPITAL_COMMUNITY)
Admission: RE | Admit: 2017-07-19 | Discharge: 2017-07-19 | Disposition: A | Discharge status: Home / Self Care | Payer: PPO | Source: Ambulatory Visit | Attending: Interventional Radiology | Admitting: Interventional Radiology

## 2017-07-19 ENCOUNTER — Other Ambulatory Visit (HOSPITAL_COMMUNITY): Payer: Self-pay | Admitting: Interventional Radiology

## 2017-07-19 DIAGNOSIS — C229 Malignant neoplasm of liver, not specified as primary or secondary: Secondary | ICD-10-CM

## 2017-07-19 DIAGNOSIS — K589 Irritable bowel syndrome without diarrhea: Secondary | ICD-10-CM | POA: Diagnosis not present

## 2017-07-19 DIAGNOSIS — R011 Cardiac murmur, unspecified: Secondary | ICD-10-CM | POA: Diagnosis not present

## 2017-07-19 DIAGNOSIS — R7303 Prediabetes: Secondary | ICD-10-CM | POA: Diagnosis not present

## 2017-07-19 DIAGNOSIS — C221 Intrahepatic bile duct carcinoma: Secondary | ICD-10-CM | POA: Diagnosis not present

## 2017-07-19 DIAGNOSIS — K219 Gastro-esophageal reflux disease without esophagitis: Secondary | ICD-10-CM | POA: Diagnosis not present

## 2017-07-19 DIAGNOSIS — E7800 Pure hypercholesterolemia, unspecified: Secondary | ICD-10-CM | POA: Diagnosis not present

## 2017-07-19 DIAGNOSIS — I10 Essential (primary) hypertension: Secondary | ICD-10-CM | POA: Insufficient documentation

## 2017-07-19 DIAGNOSIS — C228 Malignant neoplasm of liver, primary, unspecified as to type: Secondary | ICD-10-CM | POA: Diagnosis not present

## 2017-07-19 DIAGNOSIS — Z7982 Long term (current) use of aspirin: Secondary | ICD-10-CM | POA: Diagnosis not present

## 2017-07-19 HISTORY — PX: IR ANGIOGRAM VISCERAL SELECTIVE: IMG657

## 2017-07-19 HISTORY — PX: IR US GUIDE VASC ACCESS RIGHT: IMG2390

## 2017-07-19 HISTORY — PX: IR EMBO TUMOR ORGAN ISCHEMIA INFARCT INC GUIDE ROADMAPPING: IMG5449

## 2017-07-19 HISTORY — PX: IR ANGIOGRAM SELECTIVE EACH ADDITIONAL VESSEL: IMG667

## 2017-07-19 LAB — CBC WITH DIFFERENTIAL/PLATELET
Basophils Absolute: 0 10*3/uL (ref 0.0–0.1)
Basophils Relative: 1 %
EOS PCT: 3 %
Eosinophils Absolute: 0.1 10*3/uL (ref 0.0–0.7)
HCT: 35.5 % — ABNORMAL LOW (ref 36.0–46.0)
Hemoglobin: 11.9 g/dL — ABNORMAL LOW (ref 12.0–15.0)
LYMPHS ABS: 1.1 10*3/uL (ref 0.7–4.0)
LYMPHS PCT: 30 %
MCH: 29.3 pg (ref 26.0–34.0)
MCHC: 33.5 g/dL (ref 30.0–36.0)
MCV: 87.4 fL (ref 78.0–100.0)
MONO ABS: 0.4 10*3/uL (ref 0.1–1.0)
MONOS PCT: 11 %
Neutro Abs: 2.1 10*3/uL (ref 1.7–7.7)
Neutrophils Relative %: 55 %
Platelets: 202 10*3/uL (ref 150–400)
RBC: 4.06 MIL/uL (ref 3.87–5.11)
RDW: 13.1 % (ref 11.5–15.5)
WBC: 3.8 10*3/uL — ABNORMAL LOW (ref 4.0–10.5)

## 2017-07-19 LAB — COMPREHENSIVE METABOLIC PANEL
ALBUMIN: 4.3 g/dL (ref 3.5–5.0)
ALK PHOS: 111 U/L (ref 38–126)
ALT: 15 U/L (ref 14–54)
ANION GAP: 7 (ref 5–15)
AST: 25 U/L (ref 15–41)
BILIRUBIN TOTAL: 0.4 mg/dL (ref 0.3–1.2)
BUN: 17 mg/dL (ref 6–20)
CALCIUM: 9.7 mg/dL (ref 8.9–10.3)
CO2: 28 mmol/L (ref 22–32)
Chloride: 104 mmol/L (ref 101–111)
Creatinine, Ser: 0.54 mg/dL (ref 0.44–1.00)
GFR calc Af Amer: >60 mL/min (ref >60)
GFR calc non Af Amer: >60 mL/min (ref >60)
GLUCOSE: 113 mg/dL — AB (ref 65–99)
POTASSIUM: 4.1 mmol/L (ref 3.5–5.1)
SODIUM: 139 mmol/L (ref 135–145)
TOTAL PROTEIN: 7.7 g/dL (ref 6.5–8.1)

## 2017-07-19 MED ORDER — IOPAMIDOL (ISOVUE-300) INJECTION 61%: 50.0000 mL | Freq: Once | INTRAVENOUS | Status: DC | PRN

## 2017-07-19 MED ORDER — ONDANSETRON HCL 40 MG/20ML IJ SOLN
8.0000 mg | Freq: Once | INTRAMUSCULAR | Status: AC
  Administered 2017-07-19: 8 mg via INTRAVENOUS
  Filled 2017-07-19: qty 4

## 2017-07-19 MED ORDER — LIDOCAINE HCL 1 % IJ SOLN
INTRAMUSCULAR | Status: AC | PRN
  Administered 2017-07-19: 5 mL

## 2017-07-19 MED ORDER — PANTOPRAZOLE SODIUM 40 MG IV SOLR
INTRAVENOUS | Status: AC
  Filled 2017-07-19: qty 40

## 2017-07-19 MED ORDER — DEXAMETHASONE SODIUM PHOSPHATE 10 MG/ML IJ SOLN
8.0000 mg | Freq: Once | INTRAMUSCULAR | Status: AC
  Administered 2017-07-19: 8 mg via INTRAVENOUS

## 2017-07-19 MED ORDER — FENTANYL CITRATE (PF) 100 MCG/2ML IJ SOLN
INTRAMUSCULAR | Status: AC
  Filled 2017-07-19: qty 6

## 2017-07-19 MED ORDER — SODIUM CHLORIDE 0.9 % IV SOLN
INTRAVENOUS | Status: DC
  Administered 2017-07-19: 10:00:00 via INTRAVENOUS

## 2017-07-19 MED ORDER — YTTRIUM 90 INJECTION
29.1000 | INJECTION | Freq: Once | INTRAVENOUS | Status: AC
  Administered 2017-07-19: 29.1 via INTRAVENOUS

## 2017-07-19 MED ORDER — MIDAZOLAM HCL 2 MG/2ML IJ SOLN
INTRAMUSCULAR | Status: AC
  Filled 2017-07-19: qty 8

## 2017-07-19 MED ORDER — LIDOCAINE HCL 1 % IJ SOLN
INTRAMUSCULAR | Status: AC
  Filled 2017-07-19: qty 20

## 2017-07-19 MED ORDER — SODIUM CHLORIDE 0.9 % IJ SOLN
INTRAVENOUS | Status: AC | PRN
  Administered 2017-07-19: 200 ug via INTRA_ARTERIAL

## 2017-07-19 MED ORDER — IOPAMIDOL (ISOVUE-300) INJECTION 61%
INTRAVENOUS | Status: AC
  Filled 2017-07-19: qty 300

## 2017-07-19 MED ORDER — PIPERACILLIN-TAZOBACTAM 3.375 G IVPB: 3.3750 g | Freq: Once | INTRAVENOUS | Status: DC

## 2017-07-19 MED ORDER — PIPERACILLIN-TAZOBACTAM 3.375 G IVPB 30 MIN
3.3750 g | Freq: Once | INTRAVENOUS | Status: AC
  Administered 2017-07-19: 3.375 g via INTRAVENOUS

## 2017-07-19 MED ORDER — ONDANSETRON HCL 4 MG/2ML IJ SOLN
4.0000 mg | Freq: Once | INTRAMUSCULAR | Status: AC
  Administered 2017-07-19: 4 mg via INTRAVENOUS
  Filled 2017-07-19: qty 2

## 2017-07-19 MED ORDER — MIDAZOLAM HCL 2 MG/2ML IJ SOLN
INTRAMUSCULAR | Status: AC | PRN
  Administered 2017-07-19 (×2): 0.5 mg via INTRAVENOUS
  Administered 2017-07-19: 1 mg via INTRAVENOUS

## 2017-07-19 MED ORDER — PIPERACILLIN-TAZOBACTAM 3.375 G IVPB
INTRAVENOUS | Status: AC
  Filled 2017-07-19: qty 50

## 2017-07-19 MED ORDER — PANTOPRAZOLE SODIUM 40 MG IV SOLR
40.0000 mg | Freq: Once | INTRAVENOUS | Status: AC
  Administered 2017-07-19: 40 mg via INTRAVENOUS

## 2017-07-19 MED ORDER — ALUM & MAG HYDROXIDE-SIMETH 200-200-20 MG/5ML PO SUSP
30.0000 mL | Freq: Once | ORAL | Status: AC
  Administered 2017-07-19: 30 mL via ORAL
  Filled 2017-07-19: qty 30

## 2017-07-19 MED ORDER — FENTANYL CITRATE (PF) 100 MCG/2ML IJ SOLN
INTRAMUSCULAR | Status: AC | PRN
  Administered 2017-07-19: 50 ug via INTRAVENOUS
  Administered 2017-07-19: 25 ug via INTRAVENOUS

## 2017-07-19 MED ORDER — DEXAMETHASONE SODIUM PHOSPHATE 10 MG/ML IJ SOLN
INTRAMUSCULAR | Status: AC
  Administered 2017-07-19: 8 mg via INTRAVENOUS
  Filled 2017-07-19: qty 1

## 2017-07-19 MED ORDER — IOPAMIDOL (ISOVUE-300) INJECTION 61%: 75.0000 mL | Freq: Once | INTRAVENOUS | Status: DC | PRN

## 2017-07-19 NOTE — Discharge Instructions (Signed)
Post Y-90 Radioembolization Discharge Instructions ° °You have been given a radioactive material during your procedure.  While it is safe for you to be discharged home from the hospital, you need to proceed directly home.   ° °Do not use public transportation, including air travel, lasting more than 2 hours for 1 week. ° °Avoid crowded public places for 1 week. ° °Adult visitors should try to avoid close contact with you for 1 week.   ° °Children and pregnant females should not visit or have close contact with you for 1 week. ° °Items that you touch are not radioactive. ° °Do not sleep in the same bed as your partner for 1 week, and a condom should be used for sexual activity during the first 24 hours. ° °Your blood may be radioactive and caution should be used if any bleeding occurs during the recovery period. ° °Body fluids may be radioactive for 24 hours.  Wash your hands after voiding.  Men should sit to urinate.  Dispose of any soiled materials (flush down toilet or place in trash at home) during the first day. ° °Drink 6 to 8 glasses of fluids per day for 5 days to hydrate yourself. ° °If you need to see a doctor during the first week, you must let them know that you were treated with yttrium-90 microspheres, and will be slightly radioactive.  They can call Interventional Radiology [phone] with any questions. ° ° ° °Moderate Conscious Sedation, Adult, Care After °These instructions provide you with information about caring for yourself after your procedure. Your health care provider may also give you more specific instructions. Your treatment has been planned according to current medical practices, but problems sometimes occur. Call your health care provider if you have any problems or questions after your procedure. °What can I expect after the procedure? °After your procedure, it is common: °· To feel sleepy for several hours. °· To feel clumsy and have poor balance for several hours. °· To have poor  judgment for several hours. °· To vomit if you eat too soon. ° °Follow these instructions at home: °For at least 24 hours after the procedure: ° °· Do not: °? Participate in activities where you could fall or become injured. °? Drive. °? Use heavy machinery. °? Drink alcohol. °? Take sleeping pills or medicines that cause drowsiness. °? Make important decisions or sign legal documents. °? Take care of children on your own. °· Rest. °Eating and drinking °· Follow the diet recommended by your health care provider. °· If you vomit: °? Drink water, juice, or soup when you can drink without vomiting. °? Make sure you have little or no nausea before eating solid foods. °General instructions °· Have a responsible adult stay with you until you are awake and alert. °· Take over-the-counter and prescription medicines only as told by your health care provider. °· If you smoke, do not smoke without supervision. °· Keep all follow-up visits as told by your health care provider. This is important. °Contact a health care provider if: °· You keep feeling nauseous or you keep vomiting. °· You feel light-headed. °· You develop a rash. °· You have a fever. °Get help right away if: °· You have trouble breathing. °This information is not intended to replace advice given to you by your health care provider. Make sure you discuss any questions you have with your health care provider. °Document Released: 10/01/2013 Document Revised: 05/15/2016 Document Reviewed: 04/01/2016 °Elsevier Interactive Patient Education © 2018   Elsevier Inc. ° ° ° °Hepatic Artery Radioembolization, Care After °This sheet gives you information about how to care for yourself after your procedure. Your health care provider may also give you more specific instructions. If you have problems or questions, contact your health care provider. °What can I expect after the procedure? °After the procedure, it is common to have: °· A slight fever for 1-2 weeks. If your fever  gets worse, tell your health care provider. °· Fatigue. °· Loss of appetite. This should gradually improve after about 1 week. °· Abdominal pain on your right side. °· Soreness and tenderness in your groin area where the needle and catheter were placed (puncture site). ° °Follow these instructions at home: °Puncture site care °· Follow instructions from your health care provider about how to take care of the puncture site. Make sure you: °? Wash your hands with soap and water before you change your bandage (dressing). If soap and water are not available, use hand sanitizer. °? Change your dressing as told by your health care provider. °? Leave stitches (sutures), skin glue, or adhesive strips in place. These skin closures may need to stay in place for 2 weeks or longer. If adhesive strip edges start to loosen and curl up, you may trim the loose edges. Do not remove adhesive strips completely unless your health care provider tells you to do that. °· Check your puncture site every day for signs of infection. Check for: °? More redness, swelling, or pain. °? More fluid or blood. °? Warmth. °? Pus or a bad smell. °Activity °· Rest and return to your normal activities as told by your health care provider. Ask your health care provider what activities are safe for you. °· Do not drive for 24 hours after the procedure if you were given a medicine to help you relax (sedative). °· Do not lift anything that is heavier than 10 lb (4.5 kg) until your health care provider says that it is safe. °Medicines °· Take over-the-counter and prescription medicines only as told by your health care provider. °· Do not drive or use heavy machinery while taking prescription pain medicine. °Radiation precautions °· For up to a week after your procedure, there will be a small amount of radioactivity near your liver. This is not especially dangerous to other people. However, you should follow these precautions for 7 days: °? Do not come in  close contact with people. °? Do not sleep in the same bed as someone else. °? Do not hold children or babies. °? Do not have contact with pregnant women. °General instructions ° °· To prevent or treat constipation while you are taking prescription pain medicine, your health care provider may recommend that you: °? Drink enough fluid to keep your urine clear or pale yellow. °? Take over-the-counter or prescription medicines. °? Eat foods that are high in fiber, such as fresh fruits and vegetables, whole grains, and beans. °? Limit foods that are high in fat and processed sugars, such as fried and sweet foods. °· Eat frequent small meals until your appetite returns. Follow instructions from your health care provider about eating or drinking restrictions. °· Do not take baths, swim, or use a hot tub until your health care provider approves. You may take showers. Wash your puncture site with mild soap and water and pat the area dry. °· Wear compression stockings as told by your health care provider. These stockings help to prevent blood clots and reduce swelling in your   legs. °· Keep all follow-up visits as told by your health care provider. This is important. You may need to have blood tests and imaging tests done. °Contact a health care provider if: °· You have more redness, swelling, or pain around your puncture site. °· You have more fluid or blood coming from your puncture site. °· Your puncture site feels warm to the touch. °· You have pus or a bad smell coming from your puncture site. °· You have pain that: °? Gets worse. °? Does not get better with medicine. °? Feels like very bad heartburn. °? Is in the middle of your abdomen, above your belly button. °· Your skin and the white parts of your eyes turn yellow (jaundice). °· The color of your urine changes to dark brown. °· The color of your stool changes to light yellow. °· Your abdominal measurement (girth) increases in a short period of time. °· You gain more  than 5 lb (2.3 kg) in a short period of time. °Get help right away if: °· You have a fever that lasts longer than 2 weeks or is higher than what your health care provider told you to expect. °· You develop any of the following in your legs: °? Pain. °? Swelling. °? Skin that is cold or pale or turns blue. °· You have chest pain. °· You have blood in your vomit, saliva, or stool. °· You have trouble breathing. °This information is not intended to replace advice given to you by your health care provider. Make sure you discuss any questions you have with your health care provider. °Document Released: 12/16/2013 Document Revised: 09/09/2016 Document Reviewed: 09/09/2016 °Elsevier Interactive Patient Education © 2018 Elsevier Inc. ° °

## 2017-07-19 NOTE — Sedation Documentation (Signed)
Infusion of radioactive beads complete.

## 2017-07-19 NOTE — Progress Notes (Signed)
Pt continues to be comfortable with rise and fall of chest noted.  Pt requesting food but informed that we are not finished with our exam. NAD noted.  Will continue to monitor.

## 2017-07-19 NOTE — Procedures (Signed)
Hepatic cholangioca  S/p RT HEP Y90   No comp Stable EBL 0 Full report in PACS

## 2017-07-19 NOTE — Sedation Documentation (Signed)
Patient is resting comfortably. 

## 2017-07-19 NOTE — Sedation Documentation (Signed)
Radioembolization beads being infused into the pt by Dr. Clovis Riley with Dr. Annamaria Boots present.

## 2017-07-19 NOTE — Sedation Documentation (Addendum)
Patient is resting comfortably. Will continue to monitor.

## 2017-07-19 NOTE — H&P (Signed)
Referring Physician(s): Sherrill,B  Supervising Physician: Daryll Brod  Patient Status:  Laura [name]  Chief Complaint: Cholangiocarcinoma   Subjective: Patient familiar to IR service from prior right liver mass biopsy in July 2017 followed by microwave ablation of a right hepatic cholangiocarcinoma in November 2017. Recent surveillance MRI demonstrated  small nodular multifocal areas of ring enhancement about the ablation defect in segment 6 compatible with interval progression confined to this area of the liver. She was seen recently in follow-up by Dr. Annamaria Boots , deemed an appropriate candidate for Y 90 hepatic radioembolization and has undergone roadmapping study. She presents today for the procedure. She denies any acute complaints this time. Past Medical History:  Diagnosis Date  . Cancer ([place])   . GERD (gastroesophageal reflux disease)   . Heart murmur   . High cholesterol   . Hypertension   . IBS (irritable bowel syndrome)   . PONV (postoperative nausea and vomiting)   . Pre-diabetes    Past Surgical History:  Procedure Laterality Date  . BREAST SURGERY     Benign  . CHOLECYSTECTOMY    . IR ANGIOGRAM SELECTIVE EACH ADDITIONAL VESSEL  07/05/2017  . IR ANGIOGRAM SELECTIVE EACH ADDITIONAL VESSEL  07/05/2017  . IR ANGIOGRAM SELECTIVE EACH ADDITIONAL VESSEL  07/05/2017  . IR ANGIOGRAM VISCERAL SELECTIVE  07/05/2017  . IR ANGIOGRAM VISCERAL SELECTIVE  07/05/2017  . IR EMBO ARTERIAL NOT HEMORR HEMANG INC GUIDE ROADMAPPING  07/05/2017  . IR GENERIC HISTORICAL  09/27/2016   IR RADIOLOGIST EVAL & MGMT 09/27/2016 Greggory Keen, MD [facility] INTERV RAD  . IR GENERIC HISTORICAL  12/07/2016   IR RADIOLOGIST EVAL & MGMT 12/07/2016 Greggory Keen, MD [facility] INTERV RAD  . IR RADIOLOGIST EVAL & MGMT  03/08/2017  . IR US GUIDE VASC ACCESS RIGHT  07/05/2017  . TOTAL ABDOMINAL HYSTERECTOMY        Allergies: Pneumococcal vaccines and Codeine  Medications: Prior to Admission medications     Medication Sig Start Date End Date Taking? Authorizing Provider  amLODipine (NORVASC) 5 MG tablet Take 5 mg by mouth daily.  06/22/14  Yes [provider]  aspirin EC 81 MG tablet Take 81 mg by mouth daily.   Yes [provider]  Biotin 5000 MCG TABS Take 5,000 mcg by mouth every other day.   Yes [provider]  Cholecalciferol (VITAMIN D3) 5000 units CAPS Take 5,000 Units by mouth daily.   Yes [provider]  clonazePAM (KLONOPIN) 0.5 MG tablet Take 0.25-0.5 mg by mouth. Take 0.25mg s twice daily and 0.5mg s at bedtime, may take an additional 0.25mg s as needed for anxiety   Yes [provider]  docusate sodium (COLACE) 50 MG capsule Take 50 mg by mouth 3 (three) times daily as needed for mild constipation.   Yes [provider]  doxazosin (CARDURA) 1 MG tablet Take 1 mg by mouth at bedtime.  06/22/14  Yes [provider]  omeprazole (PRILOSEC OTC) 20 MG tablet Take 20 mg by mouth daily.   Yes [provider]  pravastatin (PRAVACHOL) 40 MG tablet Take 40 mg by mouth every evening.    Yes [provider]  tetrahydrozoline 0.05 % ophthalmic solution Place 1 drop into both eyes daily as needed (dry eye).    Yes [provider]  Jonetta Speak [address] [place]  12/09/16   [provider]     Vital Signs: Pulse 64   Temp 97.9 F (36.6 C) (Oral)   Resp 16  Ht 5\' 5"  (1.651 m)   Wt 144 lb 6.4 oz (65.5 kg)   SpO2 99%   BMI 24.03 kg/m   Physical Exam patient awake, alert. Chest clear to auscultation bilaterally. Heart with regular rate and rhythm. Abdomen soft, positive bowel sounds, nontender. No lower extremity edema.  Imaging: No results found.  Labs:  CBC:  Recent Labs  11/01/16 0830 11/04/16 0403 07/05/17 0816  WBC 4.1 5.5 2.9*  HGB 12.0 11.5* 11.9*  HCT 36.8 35.0* 35.0*  PLT 241 216 200    COAGS:  Recent Labs  11/01/16 0830 07/05/17 0816  INR 0.91 0.95  APTT  --  27     BMP:  Recent Labs  11/01/16 0830 03/08/17 0842 06/11/17 0824 07/05/17 0816  NA 140  --   --  140  K 3.9  --   --  3.9  CL 106  --   --  105  CO2 28  --   --  29  GLUCOSE 114*  --   --  118*  BUN 13  --   --  13  CALCIUM 9.5  --   --  9.6  CREATININE 0.55 0.60 0.60 0.53  GFRNONAA >60  --   --  >60  GFRAA >60  --   --  >60    LIVER FUNCTION TESTS:  Recent Labs  11/01/16 0830 07/05/17 0816  BILITOT 0.6 0.6  AST 19 22  ALT 11* 16  ALKPHOS 107 103  PROT 7.2 7.3  ALBUMIN 4.2 4.2    Assessment and Plan: Patient with history of segment 6 hepatic cholangiocarcinoma, status post microwave ablation in November 2017. Recent MRI demonstrates small nodular multifocal areas of ring enhancement about the ablation defect in segment 6 of the liver compatible with interval progression confined to this area of the liver. Seen in follow-up by Dr. Annamaria Boots and deemed an appropriate candidate for Y 90 hepatic radioembolization for further treatment/local palliative control. She presents today for the procedure. Details/risks of procedure, including but not limited to, internal bleeding, infection, contrast nephropathy, nontarget embolization discussed with patient/daughter with their understanding and consent. Labs pending.    Electronically Signed: D. Rowe Robert, PA-C 07/19/2017, 8:48 AM   I spent a total of 25 minutes at the the patient's bedside AND on the patient's hospital floor or unit, greater than 50% of which was counseling/coordinating care for Y 90 hepatic radioembolization

## 2017-07-20 ENCOUNTER — Other Ambulatory Visit (HOSPITAL_COMMUNITY): Payer: Self-pay | Admitting: Interventional Radiology

## 2017-07-20 DIAGNOSIS — C221 Intrahepatic bile duct carcinoma: Secondary | ICD-10-CM

## 2017-08-01 ENCOUNTER — Other Ambulatory Visit: Payer: Self-pay | Admitting: Internal Medicine

## 2017-08-01 DIAGNOSIS — Z1231 Encounter for screening mammogram for malignant neoplasm of breast: Secondary | ICD-10-CM

## 2017-08-02 ENCOUNTER — Other Ambulatory Visit: Payer: Self-pay | Admitting: Nurse Practitioner

## 2017-08-02 DIAGNOSIS — C221 Intrahepatic bile duct carcinoma: Secondary | ICD-10-CM

## 2017-08-03 ENCOUNTER — Other Ambulatory Visit (HOSPITAL_BASED_OUTPATIENT_CLINIC_OR_DEPARTMENT_OTHER): Payer: PPO

## 2017-08-03 ENCOUNTER — Ambulatory Visit (HOSPITAL_BASED_OUTPATIENT_CLINIC_OR_DEPARTMENT_OTHER): Payer: PPO | Admitting: Oncology

## 2017-08-03 ENCOUNTER — Other Ambulatory Visit: Payer: Self-pay | Admitting: *Deleted

## 2017-08-03 ENCOUNTER — Telehealth: Payer: Self-pay | Admitting: Oncology

## 2017-08-03 VITALS — BP 130/62 | HR 87 | Temp 97.7°F | Resp 18 | Ht 65.0 in | Wt 142.6 lb

## 2017-08-03 DIAGNOSIS — C801 Malignant (primary) neoplasm, unspecified: Secondary | ICD-10-CM

## 2017-08-03 DIAGNOSIS — C787 Secondary malignant neoplasm of liver and intrahepatic bile duct: Secondary | ICD-10-CM | POA: Diagnosis not present

## 2017-08-03 DIAGNOSIS — C221 Intrahepatic bile duct carcinoma: Secondary | ICD-10-CM

## 2017-08-03 LAB — CBC WITH DIFFERENTIAL/PLATELET
BASO%: 2.2 % — ABNORMAL HIGH (ref 0.0–2.0)
BASOS ABS: 0.1 10*3/uL (ref 0.0–0.1)
EOS%: 1.6 % (ref 0.0–7.0)
Eosinophils Absolute: 0.1 10*3/uL (ref 0.0–0.5)
HCT: 36.2 % (ref 34.8–46.6)
HEMOGLOBIN: 12.2 g/dL (ref 11.6–15.9)
LYMPH%: 10.3 % — ABNORMAL LOW (ref 14.0–49.7)
MCH: 29.8 pg (ref 25.1–34.0)
MCHC: 33.7 g/dL (ref 31.5–36.0)
MCV: 88.3 fL (ref 79.5–101.0)
MONO#: 0.5 10*3/uL (ref 0.1–0.9)
MONO%: 13.6 % (ref 0.0–14.0)
NEUT#: 2.8 10*3/uL (ref 1.5–6.5)
NEUT%: 72.3 % (ref 38.4–76.8)
Platelets: 260 10*3/uL (ref 145–400)
RBC: 4.09 10*6/uL (ref 3.70–5.45)
RDW: 13.4 % (ref 11.2–14.5)
WBC: 3.9 10*3/uL (ref 3.9–10.3)
lymph#: 0.4 10*3/uL — ABNORMAL LOW (ref 0.9–3.3)

## 2017-08-03 MED ORDER — PROCHLORPERAZINE MALEATE 5 MG PO TABS: 5.0000 mg | ORAL_TABLET | Freq: Four times a day (QID) | ORAL | 2 refills | Status: DC | PRN

## 2017-08-03 NOTE — Progress Notes (Signed)
[facility] OFFICE PROGRESS NOTE   Diagnosis: Cholangiocarcinoma  INTERVAL HISTORY:   Laura [name] returns as scheduled. An MRI of the liver 06/11/2017 revealed evidence of local tumor recurrence in the right liver. No evidence of distant metastatic disease.  She underwent right liver Y-90 radioembolization on 07/19/2017.  She reports mild intermittent nausea since undergoing the embolization procedure. She has mild discomfort in the subxiphoid region. No emesis. She is not taking nausea or pain medication.  Objective:  Vital signs in last 24 hours:  Blood pressure 130/62, pulse 87, temperature 97.7 F (36.5 C), temperature source Oral, resp. rate 18, height 5\' 5"  (1.651 m), weight 142 lb 9.6 oz (64.7 kg), SpO2 98 %.    HEENT: Neck without mass Lymphatics: No cervical or supraclavicular nodes Resp: Lungs clear bilaterally Cardio: Regular rate and rhythm GI: No hepatomegaly, no mass, mild tenderness in the subxiphoid region Vascular: No leg edema   Lab Results:  Lab Results  Component Value Date   WBC 3.9 08/03/2017   HGB 12.2 08/03/2017   HCT 36.2 08/03/2017   MCV 88.3 08/03/2017   PLT 260 08/03/2017   NEUTROABS 2.8 08/03/2017    CMP     Component Value Date/Time   NA 139 07/19/2017 0905   K 4.1 07/19/2017 0905   CL 104 07/19/2017 0905   CO2 28 07/19/2017 0905   GLUCOSE 113 (H) 07/19/2017 0905   BUN 17 07/19/2017 0905   CREATININE 0.54 07/19/2017 0905   CREATININE 0.72 07/27/2014 1218   CALCIUM 9.7 07/19/2017 0905   PROT 7.7 07/19/2017 0905   ALBUMIN 4.3 07/19/2017 0905   AST 25 07/19/2017 0905   ALT 15 07/19/2017 0905   ALKPHOS 111 07/19/2017 0905   BILITOT 0.4 07/19/2017 0905   GFRNONAA >60 07/19/2017 0905   GFRAA >60 07/19/2017 0905     Medications: I have reviewed the patient's current medications.  Assessment/Plan: 1. Right liver mass-biopsy 07/10/2016 confirmed adenocarcinoma  CT abdomen/pelvis 06/17/2016 and MRI abdomen  06/21/2016 confirmed an isolated right liver mass and a cystic pancreas lesion  PET scan 07/28/2016-the hepatic lesion is not hypermetabolic, hypermetabolic lesion in the gastric antrum suspicious for a primary neoplasm and no other evidence of metastatic disease  Upper endoscopy 08/03/2016-no mass found, biopsy from the gastric antrum-benign  CT-guided ablation of the right liver lesion 11/03/2016  MRI abdomen 03/08/2017-ablation defect in the right liver without residual/recurrent disease  MRI liver 06/11/2017-multiple new enhancing lesions in the right hepatic lobe surrounding the ablation defect consistent with progressive cholangiocarcinoma  Y-90 radioembolization of the right liver 07/19/2017 2. Left leg and right foot pain  Negative left leg Doppler 07/14/2016  3. Right abdominal pain  4. Anorexia/weight loss  5. Benign appearing 16 mm cystic uncinatepancreas lesion noted on MRI 06/20/2016   Slightly larger on an MRI 03/08/2017, felt to most likely represent a side branch intraductal papillary mucinous neoplasm     Disposition:  [name] underwent radioembolization of the right liver approximately 2 weeks ago. She will undergo surveillance imaging per Dr. Annamaria Boots. The nausea and mild right upper quadrant discomfort may be related to the recent procedure, cholangiocarcinoma, or another etiology. I prescribed low-dose Compazine. She will contact us if the nausea or pain increased.  She will return for an office visit after the repeat imaging in October.  15 minutes were spent with the patient today. The majority of time was used for counseling and coordination of care.  Donneta Romberg, MD  08/03/2017  9:49 AM

## 2017-08-03 NOTE — Telephone Encounter (Signed)
Gave patient avs and calendar for upcoming appts.  °

## 2017-08-16 ENCOUNTER — Ambulatory Visit
Admission: RE | Admit: 2017-08-16 | Discharge: 2017-08-16 | Disposition: A | Discharge status: Home / Self Care | Payer: PPO | Source: Ambulatory Visit | Attending: Interventional Radiology | Admitting: Interventional Radiology

## 2017-08-16 DIAGNOSIS — C221 Intrahepatic bile duct carcinoma: Secondary | ICD-10-CM | POA: Diagnosis not present

## 2017-08-16 HISTORY — PX: IR RADIOLOGIST EVAL & MGMT: IMG5224

## 2017-08-16 NOTE — Progress Notes (Signed)
Patient ID: Laura [name], female   DOB: October 16, 1941, 76 y.o.   MRN: [id number]       Chief Complaint: Right hepatic cholangiocarcinoma, one month status post Y 90 radio embolization  Referring Physician(s): Sherrill   History of Present Illness: Laura [name] is a 76 y.o. female with right hepatic cholangiocarcinoma. She was initially treated with microwave ablation. Surveillance MRI demonstrated local progression 7 months posttreatment. Therefore, she underwent right hepatic Y 90 radio embolization approximate 1 month ago. Following the procedure, she had significant fatigue, malaise, low-grade fever and nausea. This is compatible with postembolization syndrome. This has slowly resolved over the last 4 weeks. Stable weight and appetite. Her energy level has improved. No recent fevers. Overall she is getting back to her baseline. No signs of jaundice.  Past Medical History:  Diagnosis Date  . Cancer ([place])   . GERD (gastroesophageal reflux disease)   . Heart murmur   . High cholesterol   . Hypertension   . IBS (irritable bowel syndrome)   . PONV (postoperative nausea and vomiting)   . Pre-diabetes     Past Surgical History:  Procedure Laterality Date  . BREAST SURGERY     Benign  . CHOLECYSTECTOMY    . IR ANGIOGRAM SELECTIVE EACH ADDITIONAL VESSEL  07/05/2017  . IR ANGIOGRAM SELECTIVE EACH ADDITIONAL VESSEL  07/05/2017  . IR ANGIOGRAM SELECTIVE EACH ADDITIONAL VESSEL  07/05/2017  . IR ANGIOGRAM SELECTIVE EACH ADDITIONAL VESSEL  07/19/2017  . IR ANGIOGRAM VISCERAL SELECTIVE  07/05/2017  . IR ANGIOGRAM VISCERAL SELECTIVE  07/05/2017  . IR ANGIOGRAM VISCERAL SELECTIVE  07/19/2017  . IR EMBO ARTERIAL NOT HEMORR HEMANG INC GUIDE ROADMAPPING  07/05/2017  . IR EMBO TUMOR ORGAN ISCHEMIA INFARCT INC GUIDE ROADMAPPING  07/19/2017  . IR GENERIC HISTORICAL  09/27/2016   IR RADIOLOGIST EVAL & MGMT 09/27/2016 Greggory Keen, MD [facility] INTERV RAD  . IR GENERIC HISTORICAL  12/07/2016   IR  RADIOLOGIST EVAL & MGMT 12/07/2016 Greggory Keen, MD [facility] INTERV RAD  . IR RADIOLOGIST EVAL & MGMT  03/08/2017  . IR US GUIDE VASC ACCESS RIGHT  07/05/2017  . IR US GUIDE VASC ACCESS RIGHT  07/19/2017  . TOTAL ABDOMINAL HYSTERECTOMY      Allergies: Pneumococcal vaccines and Codeine  Medications: Prior to Admission medications   Medication Sig Start Date End Date Taking? Authorizing Provider  amLODipine (NORVASC) 5 MG tablet Take 5 mg by mouth daily.  06/22/14  Yes [provider]  aspirin EC 81 MG tablet Take 81 mg by mouth daily.   Yes [provider]  Biotin 5000 MCG TABS Take 5,000 mcg by mouth every other day.   Yes [provider]  Cholecalciferol (VITAMIN D3) 5000 units CAPS Take 5,000 Units by mouth daily.   Yes [provider]  clonazePAM (KLONOPIN) 0.5 MG tablet Take 0.25-0.5 mg by mouth. Take 0.25mg s twice daily and 0.5mg s at bedtime, may take an additional 0.25mg s as needed for anxiety   Yes [provider]  docusate sodium (COLACE) 50 MG capsule Take 50 mg by mouth 3 (three) times daily as needed for mild constipation.   Yes [provider]  doxazosin (CARDURA) 1 MG tablet Take 1 mg by mouth at bedtime.  06/22/14  Yes [provider]  omeprazole (PRILOSEC) 20 MG capsule Take 20 mg by mouth 2 (two) times daily. 06/25/17  Yes [provider]  Jonetta Speak LANCETS 37C [place]  12/09/16  Yes [provider]  pravastatin (PRAVACHOL) 40  MG tablet Take 40 mg by mouth every evening.    Yes [provider]  prochlorperazine (COMPAZINE) 5 MG tablet Take 1 tablet (5 mg total) by mouth every 6 (six) hours as needed for nausea or vomiting. 08/03/17  Yes Ladell Pier, MD  tetrahydrozoline 0.05 % ophthalmic solution Place 1 drop into both eyes daily as needed (dry eye).    Yes [provider]     Family History  Problem Relation Age of Onset  . Alzheimer's disease Mother   . Alzheimer's disease  Brother   . Alzheimer's disease Sister   . CAD Brother 57       CABG  . Cancer Sister        Breast  . Cancer Brother        Lung    Social History   Social History  . Marital status: Widowed    Spouse name: Lanae Boast  . Number of children: 3  . Years of education: N/A   Occupational History  . Retired    Social History Main Topics  . Smoking status: Never Smoker  . Smokeless tobacco: Never Used  . Alcohol use No  . Drug use: No  . Sexual activity: Not on file   Other Topics Concern  . Not on file   Social History Narrative   Lives at home with husband, Lanae Boast who is in poor health (she is caregiver)   Has #3 daughters   Retired from working at [employer]   Has total of #7 siblings    ECOG Status: 1 - Symptomatic but completely ambulatory  Review of Systems: A 12 point ROS discussed and pertinent positives are indicated in the HPI above.  All other systems are negative.  Review of Systems  Vital Signs: BP (!) 140/58   Pulse 80   Temp 97.8 F (36.6 C) (Oral)   Resp 14   SpO2 98%   Physical Exam  Constitutional: She is oriented to person, place, and time. She appears well-developed and well-nourished. No distress.  Eyes: Conjunctivae are normal. No scleral icterus.  Cardiovascular: Normal rate, regular rhythm and normal heart sounds.   No murmur heard. Pulmonary/Chest: Effort normal and breath sounds normal. No respiratory distress.  Abdominal: Soft. Bowel sounds are normal. She exhibits no distension.  Musculoskeletal: She exhibits no edema or tenderness.  Neurological: She is alert and oriented to person, place, and time.  Skin: Skin is warm and dry. She is not diaphoretic. No erythema.  Psychiatric: She has a normal mood and affect. Her behavior is normal.      Imaging: Nm Liver Img Spect  Result Date: 07/19/2017 CLINICAL DATA:  Unresectable cholangiocarcinoma. First treatment to right lobe of liver. EXAM: NUCLEAR MEDICINE SPECIAL MED RAD  PHYSICS CONS; NUCLEAR MEDICINE RADIO PHARM THERAPY INTRA ARTERIAL; NUCLEAR MEDICINE TREATMENT PROCEDURE; NUCLEAR MEDICINE LIVER SCAN TECHNIQUE: In conjunction with the interventional radiologist a Y- Microsphere dose was calculated utilizing body surface area formulation. Calculated dose equal 31.08 mCi. Pre therapy MAA liver SPECT scan and CTA were evaluated. Utilizing a microcatheter system, the hepatic artery was selected and Y-90 microspheres were delivered in fractionated aliquots. Radiopharmaceutical was delivered by the interventional radiologist and nuclear radiologist. The patient tolerated procedure well. No adverse effects were noted. Bremsstrahlung planar and SPECT imaging of the abdomen following intrahepatic arterial delivery of Y-90 microsphere was performed. RADIOPHARMACEUTICALS:  29.1 MCI Y-90 MICROSPHERES COMPARISON:  MRI of the liver from 06/11/2017 FINDINGS: Y - 90 microspheres therapy as above.  First therapy the right hepatic lobe. Bremsstrahlung planar and SPECT imaging of the abdomen following intrahepatic arterial delivery of Y-62microsphere demonstrates radioactivity localized to the right hepatic lobe. No evidence of extrahepatic activity. IMPRESSION: Successful Y - 90 microsphere delivery for treatment of unresectable liver metastasis. First therapy to the right lobe. Bremssstrahlung scan demonstrates activity localized to right hepatic lobe with no extrahepatic activity identified. Electronically Signed   By: Kerby Moors M.D.   On: 07/19/2017 13:47   Ir Angiogram Visceral Selective  Result Date: 07/19/2017 INDICATION: Right hepatic cholangiocarcinoma EXAM: Ultrasound guidance for vascular access Celiac and right hepatic catheterizations and angiograms Peripheral right hepatic micro catheterization, angiogram, and right hepatic Y 90 radio embolization MEDICATIONS: 3.375 g Zosyn. The antibiotic was administered within 1 hour of the procedure. 200 mcg intra- arterial nitroglycerin  into the right hepatic artery. 40 mg Protonix IV, 8 mg Decadron, 8 mg Zofran ANESTHESIA/SEDATION: Moderate (conscious) sedation was employed during this procedure. A total of Versed 2.0 mg and Fentanyl 75 mcg was administered intravenously. Moderate Sedation Time: 45 minutes. The patient's level of consciousness and vital signs were monitored continuously by radiology nursing throughout the procedure under my direct supervision. CONTRAST:  75 cc Isovue-300 FLUOROSCOPY TIME:  Fluoroscopy Time: 6 minutes 24 seconds (476 mGy). COMPLICATIONS: None immediate. PROCEDURE: Informed consent was obtained from the patient following explanation of the procedure, risks, benefits and alternatives. The patient understands, agrees and consents for the procedure. All questions were addressed. A time out was performed prior to the initiation of the procedure. Maximal barrier sterile technique utilized including caps, mask, sterile gowns, sterile gloves, large sterile drape, hand hygiene, and Betadine prep. Under sterile conditions and local anesthesia, ultrasound micropuncture access performed of the right common femoral artery. Five French sheath inserted. Mickelson catheter reformed in the aorta and utilized to select the celiac origin. Celiac angiogram performed. Celiac: Celiac origin remains patent. Left gastric, splenic and hepatic vasculature all patent. Previous coil embolization of the right gastric artery. Flow within the GDA is retrograde. Hepatic vasculature remains patent. Delayed tumor blush present in the inferior right hepatic lobe correlating with the MRI. portal vein remains patent on delayed imaging. Progreat high flow microcatheter was advanced through the [facility] catheter over a double angled Glidewire and advanced into the peripheral right inferior hepatic branch supplying the area of abnormal tumor vascularity. Right hepatic angio: Small amount of vasospasm encounter. This was treated successfully with 200 mcg  nitroglycerin intra-arterial. Right inferior hepatic artery is the dominant branch to the abnormal tumor vascularity. microcatheter was positioned in this branch proximally. Peripheral right inferior hepatic Y 90 radio embolization: From this location, under intermittent fluoroscopy, the 30 millicurie Y 90 dose was delivered without complication. Post embolization angiogram confirms preserved patency of the right hepatic vasculature. There is sluggish flow within the right inferior hepatic branch. Tumor staining present in the right lobe inferiorly. Access removed. Hemostasis obtained with manual compression for 15 minutes. No immediate complication. Patient tolerated the procedure well. IMPRESSION: Successful right hepatic Y 90 radio embolization as above. Electronically Signed   By: Jerilynn Mages.  Kendyll Huettner M.D.   On: 07/19/2017 11:21   Ir Angiogram Selective Each Additional Vessel  Result Date: 07/19/2017 INDICATION: Right hepatic cholangiocarcinoma EXAM: Ultrasound guidance for vascular access Celiac and right hepatic catheterizations and angiograms Peripheral right hepatic micro catheterization, angiogram, and right hepatic Y 90 radio embolization MEDICATIONS: 3.375 g Zosyn. The antibiotic was administered within 1 hour of the procedure. 200 mcg intra- arterial nitroglycerin into the right  hepatic artery. 40 mg Protonix IV, 8 mg Decadron, 8 mg Zofran ANESTHESIA/SEDATION: Moderate (conscious) sedation was employed during this procedure. A total of Versed 2.0 mg and Fentanyl 75 mcg was administered intravenously. Moderate Sedation Time: 45 minutes. The patient's level of consciousness and vital signs were monitored continuously by radiology nursing throughout the procedure under my direct supervision. CONTRAST:  75 cc Isovue-300 FLUOROSCOPY TIME:  Fluoroscopy Time: 6 minutes 24 seconds (476 mGy). COMPLICATIONS: None immediate. PROCEDURE: Informed consent was obtained from the patient following explanation of the procedure,  risks, benefits and alternatives. The patient understands, agrees and consents for the procedure. All questions were addressed. A time out was performed prior to the initiation of the procedure. Maximal barrier sterile technique utilized including caps, mask, sterile gowns, sterile gloves, large sterile drape, hand hygiene, and Betadine prep. Under sterile conditions and local anesthesia, ultrasound micropuncture access performed of the right common femoral artery. Five French sheath inserted. Mickelson catheter reformed in the aorta and utilized to select the celiac origin. Celiac angiogram performed. Celiac: Celiac origin remains patent. Left gastric, splenic and hepatic vasculature all patent. Previous coil embolization of the right gastric artery. Flow within the GDA is retrograde. Hepatic vasculature remains patent. Delayed tumor blush present in the inferior right hepatic lobe correlating with the MRI. portal vein remains patent on delayed imaging. Progreat high flow microcatheter was advanced through the [facility] catheter over a double angled Glidewire and advanced into the peripheral right inferior hepatic branch supplying the area of abnormal tumor vascularity. Right hepatic angio: Small amount of vasospasm encounter. This was treated successfully with 200 mcg nitroglycerin intra-arterial. Right inferior hepatic artery is the dominant branch to the abnormal tumor vascularity. microcatheter was positioned in this branch proximally. Peripheral right inferior hepatic Y 90 radio embolization: From this location, under intermittent fluoroscopy, the 30 millicurie Y 90 dose was delivered without complication. Post embolization angiogram confirms preserved patency of the right hepatic vasculature. There is sluggish flow within the right inferior hepatic branch. Tumor staining present in the right lobe inferiorly. Access removed. Hemostasis obtained with manual compression for 15 minutes. No immediate complication.  Patient tolerated the procedure well. IMPRESSION: Successful right hepatic Y 90 radio embolization as above. Electronically Signed   By: Jerilynn Mages.  Maanasa Aderhold M.D.   On: 07/19/2017 11:21   Nm Special Med Rad Physics Cons  Result Date: 07/19/2017 CLINICAL DATA:  Unresectable cholangiocarcinoma. First treatment to right lobe of liver. EXAM: NUCLEAR MEDICINE SPECIAL MED RAD PHYSICS CONS; NUCLEAR MEDICINE RADIO PHARM THERAPY INTRA ARTERIAL; NUCLEAR MEDICINE TREATMENT PROCEDURE; NUCLEAR MEDICINE LIVER SCAN TECHNIQUE: In conjunction with the interventional radiologist a Y- Microsphere dose was calculated utilizing body surface area formulation. Calculated dose equal 31.08 mCi. Pre therapy MAA liver SPECT scan and CTA were evaluated. Utilizing a microcatheter system, the hepatic artery was selected and Y-90 microspheres were delivered in fractionated aliquots. Radiopharmaceutical was delivered by the interventional radiologist and nuclear radiologist. The patient tolerated procedure well. No adverse effects were noted. Bremsstrahlung planar and SPECT imaging of the abdomen following intrahepatic arterial delivery of Y-90 microsphere was performed. RADIOPHARMACEUTICALS:  29.1 MCI Y-90 MICROSPHERES COMPARISON:  MRI of the liver from 06/11/2017 FINDINGS: Y - 90 microspheres therapy as above. First therapy the right hepatic lobe. Bremsstrahlung planar and SPECT imaging of the abdomen following intrahepatic arterial delivery of Y-71microsphere demonstrates radioactivity localized to the right hepatic lobe. No evidence of extrahepatic activity. IMPRESSION: Successful Y - 90 microsphere delivery for treatment of unresectable liver metastasis. First therapy to the  right lobe. Bremssstrahlung scan demonstrates activity localized to right hepatic lobe with no extrahepatic activity identified. Electronically Signed   By: Kerby Moors M.D.   On: 07/19/2017 13:47   Nm Special Treatment Procedure  Result Date: 07/19/2017 CLINICAL DATA:   Unresectable cholangiocarcinoma. First treatment to right lobe of liver. EXAM: NUCLEAR MEDICINE SPECIAL MED RAD PHYSICS CONS; NUCLEAR MEDICINE RADIO PHARM THERAPY INTRA ARTERIAL; NUCLEAR MEDICINE TREATMENT PROCEDURE; NUCLEAR MEDICINE LIVER SCAN TECHNIQUE: In conjunction with the interventional radiologist a Y- Microsphere dose was calculated utilizing body surface area formulation. Calculated dose equal 31.08 mCi. Pre therapy MAA liver SPECT scan and CTA were evaluated. Utilizing a microcatheter system, the hepatic artery was selected and Y-90 microspheres were delivered in fractionated aliquots. Radiopharmaceutical was delivered by the interventional radiologist and nuclear radiologist. The patient tolerated procedure well. No adverse effects were noted. Bremsstrahlung planar and SPECT imaging of the abdomen following intrahepatic arterial delivery of Y-90 microsphere was performed. RADIOPHARMACEUTICALS:  29.1 MCI Y-90 MICROSPHERES COMPARISON:  MRI of the liver from 06/11/2017 FINDINGS: Y - 90 microspheres therapy as above. First therapy the right hepatic lobe. Bremsstrahlung planar and SPECT imaging of the abdomen following intrahepatic arterial delivery of Y-68microsphere demonstrates radioactivity localized to the right hepatic lobe. No evidence of extrahepatic activity. IMPRESSION: Successful Y - 90 microsphere delivery for treatment of unresectable liver metastasis. First therapy to the right lobe. Bremssstrahlung scan demonstrates activity localized to right hepatic lobe with no extrahepatic activity identified. Electronically Signed   By: Kerby Moors M.D.   On: 07/19/2017 13:47   Ir US Guide Vasc Access Right  Result Date: 07/19/2017 INDICATION: Right hepatic cholangiocarcinoma EXAM: Ultrasound guidance for vascular access Celiac and right hepatic catheterizations and angiograms Peripheral right hepatic micro catheterization, angiogram, and right hepatic Y 90 radio embolization MEDICATIONS: 3.375 g  Zosyn. The antibiotic was administered within 1 hour of the procedure. 200 mcg intra- arterial nitroglycerin into the right hepatic artery. 40 mg Protonix IV, 8 mg Decadron, 8 mg Zofran ANESTHESIA/SEDATION: Moderate (conscious) sedation was employed during this procedure. A total of Versed 2.0 mg and Fentanyl 75 mcg was administered intravenously. Moderate Sedation Time: 45 minutes. The patient's level of consciousness and vital signs were monitored continuously by radiology nursing throughout the procedure under my direct supervision. CONTRAST:  75 cc Isovue-300 FLUOROSCOPY TIME:  Fluoroscopy Time: 6 minutes 24 seconds (476 mGy). COMPLICATIONS: None immediate. PROCEDURE: Informed consent was obtained from the patient following explanation of the procedure, risks, benefits and alternatives. The patient understands, agrees and consents for the procedure. All questions were addressed. A time out was performed prior to the initiation of the procedure. Maximal barrier sterile technique utilized including caps, mask, sterile gowns, sterile gloves, large sterile drape, hand hygiene, and Betadine prep. Under sterile conditions and local anesthesia, ultrasound micropuncture access performed of the right common femoral artery. Five French sheath inserted. Mickelson catheter reformed in the aorta and utilized to select the celiac origin. Celiac angiogram performed. Celiac: Celiac origin remains patent. Left gastric, splenic and hepatic vasculature all patent. Previous coil embolization of the right gastric artery. Flow within the GDA is retrograde. Hepatic vasculature remains patent. Delayed tumor blush present in the inferior right hepatic lobe correlating with the MRI. portal vein remains patent on delayed imaging. Progreat high flow microcatheter was advanced through the [facility] catheter over a double angled Glidewire and advanced into the peripheral right inferior hepatic branch supplying the area of abnormal tumor  vascularity. Right hepatic angio: Small amount of vasospasm encounter. This  was treated successfully with 200 mcg nitroglycerin intra-arterial. Right inferior hepatic artery is the dominant branch to the abnormal tumor vascularity. microcatheter was positioned in this branch proximally. Peripheral right inferior hepatic Y 90 radio embolization: From this location, under intermittent fluoroscopy, the 30 millicurie Y 90 dose was delivered without complication. Post embolization angiogram confirms preserved patency of the right hepatic vasculature. There is sluggish flow within the right inferior hepatic branch. Tumor staining present in the right lobe inferiorly. Access removed. Hemostasis obtained with manual compression for 15 minutes. No immediate complication. Patient tolerated the procedure well. IMPRESSION: Successful right hepatic Y 90 radio embolization as above. Electronically Signed   By: Jerilynn Mages.  Trevonn Hallum M.D.   On: 07/19/2017 11:21   Ir Embo Tumor Organ Ischemia Infarct Inc Guide Roadmapping  Result Date: 07/19/2017 INDICATION: Right hepatic cholangiocarcinoma EXAM: Ultrasound guidance for vascular access Celiac and right hepatic catheterizations and angiograms Peripheral right hepatic micro catheterization, angiogram, and right hepatic Y 90 radio embolization MEDICATIONS: 3.375 g Zosyn. The antibiotic was administered within 1 hour of the procedure. 200 mcg intra- arterial nitroglycerin into the right hepatic artery. 40 mg Protonix IV, 8 mg Decadron, 8 mg Zofran ANESTHESIA/SEDATION: Moderate (conscious) sedation was employed during this procedure. A total of Versed 2.0 mg and Fentanyl 75 mcg was administered intravenously. Moderate Sedation Time: 45 minutes. The patient's level of consciousness and vital signs were monitored continuously by radiology nursing throughout the procedure under my direct supervision. CONTRAST:  75 cc Isovue-300 FLUOROSCOPY TIME:  Fluoroscopy Time: 6 minutes 24 seconds (476 mGy).  COMPLICATIONS: None immediate. PROCEDURE: Informed consent was obtained from the patient following explanation of the procedure, risks, benefits and alternatives. The patient understands, agrees and consents for the procedure. All questions were addressed. A time out was performed prior to the initiation of the procedure. Maximal barrier sterile technique utilized including caps, mask, sterile gowns, sterile gloves, large sterile drape, hand hygiene, and Betadine prep. Under sterile conditions and local anesthesia, ultrasound micropuncture access performed of the right common femoral artery. Five French sheath inserted. Mickelson catheter reformed in the aorta and utilized to select the celiac origin. Celiac angiogram performed. Celiac: Celiac origin remains patent. Left gastric, splenic and hepatic vasculature all patent. Previous coil embolization of the right gastric artery. Flow within the GDA is retrograde. Hepatic vasculature remains patent. Delayed tumor blush present in the inferior right hepatic lobe correlating with the MRI. portal vein remains patent on delayed imaging. Progreat high flow microcatheter was advanced through the [facility] catheter over a double angled Glidewire and advanced into the peripheral right inferior hepatic branch supplying the area of abnormal tumor vascularity. Right hepatic angio: Small amount of vasospasm encounter. This was treated successfully with 200 mcg nitroglycerin intra-arterial. Right inferior hepatic artery is the dominant branch to the abnormal tumor vascularity. microcatheter was positioned in this branch proximally. Peripheral right inferior hepatic Y 90 radio embolization: From this location, under intermittent fluoroscopy, the 30 millicurie Y 90 dose was delivered without complication. Post embolization angiogram confirms preserved patency of the right hepatic vasculature. There is sluggish flow within the right inferior hepatic branch. Tumor staining present in  the right lobe inferiorly. Access removed. Hemostasis obtained with manual compression for 15 minutes. No immediate complication. Patient tolerated the procedure well. IMPRESSION: Successful right hepatic Y 90 radio embolization as above. Electronically Signed   By: Jerilynn Mages.  Victoriah Wilds M.D.   On: 07/19/2017 11:21   Nm Radio Pharm Therapy Intraarterial  Result Date: 07/19/2017 CLINICAL DATA:  Unresectable cholangiocarcinoma.  First treatment to right lobe of liver. EXAM: NUCLEAR MEDICINE SPECIAL MED RAD PHYSICS CONS; NUCLEAR MEDICINE RADIO PHARM THERAPY INTRA ARTERIAL; NUCLEAR MEDICINE TREATMENT PROCEDURE; NUCLEAR MEDICINE LIVER SCAN TECHNIQUE: In conjunction with the interventional radiologist a Y- Microsphere dose was calculated utilizing body surface area formulation. Calculated dose equal 31.08 mCi. Pre therapy MAA liver SPECT scan and CTA were evaluated. Utilizing a microcatheter system, the hepatic artery was selected and Y-90 microspheres were delivered in fractionated aliquots. Radiopharmaceutical was delivered by the interventional radiologist and nuclear radiologist. The patient tolerated procedure well. No adverse effects were noted. Bremsstrahlung planar and SPECT imaging of the abdomen following intrahepatic arterial delivery of Y-90 microsphere was performed. RADIOPHARMACEUTICALS:  29.1 MCI Y-90 MICROSPHERES COMPARISON:  MRI of the liver from 06/11/2017 FINDINGS: Y - 90 microspheres therapy as above. First therapy the right hepatic lobe. Bremsstrahlung planar and SPECT imaging of the abdomen following intrahepatic arterial delivery of Y-99microsphere demonstrates radioactivity localized to the right hepatic lobe. No evidence of extrahepatic activity. IMPRESSION: Successful Y - 90 microsphere delivery for treatment of unresectable liver metastasis. First therapy to the right lobe. Bremssstrahlung scan demonstrates activity localized to right hepatic lobe with no extrahepatic activity identified. Electronically  Signed   By: Kerby Moors M.D.   On: 07/19/2017 13:47    Labs:  CBC:  Recent Labs  11/04/16 0403 07/05/17 0816 07/19/17 0905 08/03/17 0811  WBC 5.5 2.9* 3.8* 3.9  HGB 11.5* 11.9* 11.9* 12.2  HCT 35.0* 35.0* 35.5* 36.2  PLT 216 200 202 260    COAGS:  Recent Labs  11/01/16 0830 07/05/17 0816  INR 0.91 0.95  APTT  --  27    BMP:  Recent Labs  11/01/16 0830 03/08/17 0842 06/11/17 0824 07/05/17 0816 07/19/17 0905  NA 140  --   --  140 139  K 3.9  --   --  3.9 4.1  CL 106  --   --  105 104  CO2 28  --   --  29 28  GLUCOSE 114*  --   --  118* 113*  BUN 13  --   --  13 17  CALCIUM 9.5  --   --  9.6 9.7  CREATININE 0.55 0.60 0.60 0.53 0.54  GFRNONAA >60  --   --  >60 >60  GFRAA >60  --   --  >60 >60    LIVER FUNCTION TESTS:  Recent Labs  11/01/16 0830 07/05/17 0816 07/19/17 0905  BILITOT 0.6 0.6 0.4  AST 19 22 25   ALT 11* 16 15  ALKPHOS 107 103 111  PROT 7.2 7.3 7.7  ALBUMIN 4.2 4.2 4.3    TUMOR MARKERS: No results for input(s): AFPTM, CEA, CA199, CHROMGRNA in the last 8760 hours.  Assessment and Plan:  1 month status post right hepatic Y 90 radio embolization for local progression of right hepatic cholangiocarcinoma. Post embolization syndrome is resolving. Energy level and appetite returning. Stable weight. No signs of jaundice or liver failure. Overall she is nearly at her baseline functional status.  Plan: Repeat MRI in October as an outpatient.   Electronically Signed: Greggory Keen 08/16/2017, 8:50 AM   I spent a total of    25 Minutes in face to face in clinical consultation, greater than 50% of which was counseling/coordinating care for this patient with hepatic cholangiocarcinoma

## 2017-08-20 DIAGNOSIS — E7800 Pure hypercholesterolemia, unspecified: Secondary | ICD-10-CM | POA: Diagnosis not present

## 2017-08-20 DIAGNOSIS — D6489 Other specified anemias: Secondary | ICD-10-CM | POA: Diagnosis not present

## 2017-08-20 DIAGNOSIS — F430 Acute stress reaction: Secondary | ICD-10-CM | POA: Diagnosis not present

## 2017-08-20 DIAGNOSIS — D692 Other nonthrombocytopenic purpura: Secondary | ICD-10-CM | POA: Diagnosis not present

## 2017-08-20 DIAGNOSIS — K589 Irritable bowel syndrome without diarrhea: Secondary | ICD-10-CM | POA: Diagnosis not present

## 2017-08-20 DIAGNOSIS — Z23 Encounter for immunization: Secondary | ICD-10-CM | POA: Diagnosis not present

## 2017-08-20 DIAGNOSIS — I10 Essential (primary) hypertension: Secondary | ICD-10-CM | POA: Diagnosis not present

## 2017-08-20 DIAGNOSIS — D72819 Decreased white blood cell count, unspecified: Secondary | ICD-10-CM | POA: Diagnosis not present

## 2017-08-20 DIAGNOSIS — R7302 Impaired glucose tolerance (oral): Secondary | ICD-10-CM | POA: Diagnosis not present

## 2017-08-20 DIAGNOSIS — Z6823 Body mass index (BMI) 23.0-23.9, adult: Secondary | ICD-10-CM | POA: Diagnosis not present

## 2017-08-20 DIAGNOSIS — C228 Malignant neoplasm of liver, primary, unspecified as to type: Secondary | ICD-10-CM | POA: Diagnosis not present

## 2017-09-10 ENCOUNTER — Other Ambulatory Visit: Payer: Self-pay | Admitting: *Deleted

## 2017-09-10 ENCOUNTER — Other Ambulatory Visit (HOSPITAL_COMMUNITY): Payer: Self-pay | Admitting: Interventional Radiology

## 2017-09-10 DIAGNOSIS — C221 Intrahepatic bile duct carcinoma: Secondary | ICD-10-CM

## 2017-09-10 DIAGNOSIS — C240 Malignant neoplasm of extrahepatic bile duct: Secondary | ICD-10-CM

## 2017-10-01 ENCOUNTER — Ambulatory Visit
Admission: RE | Admit: 2017-10-01 | Discharge: 2017-10-01 | Disposition: A | Discharge status: Home / Self Care | Payer: PPO | Source: Ambulatory Visit | Attending: Internal Medicine | Admitting: Internal Medicine

## 2017-10-01 DIAGNOSIS — Z1231 Encounter for screening mammogram for malignant neoplasm of breast: Secondary | ICD-10-CM | POA: Diagnosis not present

## 2017-10-08 ENCOUNTER — Encounter: Payer: Self-pay | Admitting: Interventional Radiology

## 2017-10-09 ENCOUNTER — Ambulatory Visit (HOSPITAL_COMMUNITY)
Admission: RE | Admit: 2017-10-09 | Discharge: 2017-10-09 | Disposition: A | Discharge status: Home / Self Care | Payer: PPO | Source: Ambulatory Visit | Attending: Interventional Radiology | Admitting: Interventional Radiology

## 2017-10-09 ENCOUNTER — Other Ambulatory Visit (HOSPITAL_COMMUNITY): Payer: Self-pay | Admitting: Interventional Radiology

## 2017-10-09 ENCOUNTER — Ambulatory Visit
Admission: RE | Admit: 2017-10-09 | Discharge: 2017-10-09 | Disposition: A | Discharge status: Home / Self Care | Payer: PPO | Source: Ambulatory Visit | Attending: Interventional Radiology | Admitting: Interventional Radiology

## 2017-10-09 DIAGNOSIS — C228 Malignant neoplasm of liver, primary, unspecified as to type: Secondary | ICD-10-CM | POA: Diagnosis not present

## 2017-10-09 DIAGNOSIS — Z9049 Acquired absence of other specified parts of digestive tract: Secondary | ICD-10-CM | POA: Insufficient documentation

## 2017-10-09 DIAGNOSIS — K869 Disease of pancreas, unspecified: Secondary | ICD-10-CM | POA: Diagnosis not present

## 2017-10-09 DIAGNOSIS — C221 Intrahepatic bile duct carcinoma: Secondary | ICD-10-CM | POA: Insufficient documentation

## 2017-10-09 DIAGNOSIS — Z9889 Other specified postprocedural states: Secondary | ICD-10-CM | POA: Diagnosis not present

## 2017-10-09 DIAGNOSIS — K769 Liver disease, unspecified: Secondary | ICD-10-CM | POA: Insufficient documentation

## 2017-10-09 HISTORY — PX: IR RADIOLOGIST EVAL & MGMT: IMG5224

## 2017-10-09 LAB — POCT I-STAT CREATININE: Creatinine, Ser: 0.5 mg/dL (ref 0.44–1.00)

## 2017-10-09 MED ORDER — GADOBENATE DIMEGLUMINE 529 MG/ML IV SOLN
15.0000 mL | Freq: Once | INTRAVENOUS | Status: AC | PRN
  Administered 2017-10-09: 13 mL via INTRAVENOUS

## 2017-10-09 NOTE — Progress Notes (Signed)
Patient ID: Laura [name], female   DOB: 14-Dec-1941, 76 y.o.   MRN: [id number]       Chief Complaint: Right hepatic cholangiocarcinoma, 3 months status post Y 90 radio embolization  Referring Physician(s): Sherrill  History of Present Illness: Laura [name] is a 76 y.o. female with biopsy-proven right hepatic cholangiocarcinoma. This was initially treated with microwave ablation. Surveillance imaging demonstrated local progression after 7 months. She underwent successful right hepatic Y 90 radio embolization in July 2018. Over the last 3 months she has recovered at home. Minor nausea has resolved. Stable weight and appetite. Energy level continues to improve. No interval fevers. She is back to her baseline. No signs of jaundice or liver failure. She is here to review her MRI 3 months posttreatment.  Past Medical History:  Diagnosis Date  . Cancer ([place])   . GERD (gastroesophageal reflux disease)   . Heart murmur   . High cholesterol   . Hypertension   . IBS (irritable bowel syndrome)   . PONV (postoperative nausea and vomiting)   . Pre-diabetes     Past Surgical History:  Procedure Laterality Date  . BREAST EXCISIONAL BIOPSY Right 1960   scar is not visible   . BREAST SURGERY     Benign  . CHOLECYSTECTOMY    . IR ANGIOGRAM SELECTIVE EACH ADDITIONAL VESSEL  07/05/2017  . IR ANGIOGRAM SELECTIVE EACH ADDITIONAL VESSEL  07/05/2017  . IR ANGIOGRAM SELECTIVE EACH ADDITIONAL VESSEL  07/05/2017  . IR ANGIOGRAM SELECTIVE EACH ADDITIONAL VESSEL  07/19/2017  . IR ANGIOGRAM VISCERAL SELECTIVE  07/05/2017  . IR ANGIOGRAM VISCERAL SELECTIVE  07/05/2017  . IR ANGIOGRAM VISCERAL SELECTIVE  07/19/2017  . IR EMBO ARTERIAL NOT HEMORR HEMANG INC GUIDE ROADMAPPING  07/05/2017  . IR EMBO TUMOR ORGAN ISCHEMIA INFARCT INC GUIDE ROADMAPPING  07/19/2017  . IR GENERIC HISTORICAL  09/27/2016   IR RADIOLOGIST EVAL & MGMT 09/27/2016 Greggory Keen, MD [facility] INTERV RAD  . IR GENERIC HISTORICAL  12/07/2016   IR  RADIOLOGIST EVAL & MGMT 12/07/2016 Greggory Keen, MD [facility] INTERV RAD  . IR RADIOLOGIST EVAL & MGMT  03/08/2017  . IR RADIOLOGIST EVAL & MGMT  08/16/2017  . IR US GUIDE VASC ACCESS RIGHT  07/05/2017  . IR US GUIDE VASC ACCESS RIGHT  07/19/2017  . TOTAL ABDOMINAL HYSTERECTOMY      Allergies: Pneumococcal vaccines and Codeine  Medications: Prior to Admission medications   Medication Sig Start Date End Date Taking? Authorizing Provider  amLODipine (NORVASC) 5 MG tablet Take 5 mg by mouth daily.  06/22/14  Yes [provider]  aspirin EC 81 MG tablet Take 81 mg by mouth daily.   Yes [provider]  Biotin 5000 MCG TABS Take 5,000 mcg by mouth every other day.   Yes [provider]  Cholecalciferol (VITAMIN D3) 5000 units CAPS Take 5,000 Units by mouth daily.   Yes [provider]  clonazePAM (KLONOPIN) 0.5 MG tablet Take 0.25-0.5 mg by mouth. Take 0.25mg s twice daily and 0.5mg s at bedtime, may take an additional 0.25mg s as needed for anxiety   Yes [provider]  docusate sodium (COLACE) 50 MG capsule Take 50 mg by mouth 3 (three) times daily as needed for mild constipation.   Yes [provider]  doxazosin (CARDURA) 1 MG tablet Take 1 mg by mouth at bedtime.  06/22/14  Yes [provider]  omeprazole (PRILOSEC) 20 MG capsule Take 20 mg by mouth 2 (two) times daily. 06/25/17  Yes  [provider]  Jonetta Speak LANCETS 82X [place]  12/09/16  Yes [provider]  pravastatin (PRAVACHOL) 40 MG tablet Take 40 mg by mouth every evening.    Yes [provider]  prochlorperazine (COMPAZINE) 5 MG tablet Take 1 tablet (5 mg total) by mouth every 6 (six) hours as needed for nausea or vomiting. 08/03/17  Yes Ladell Pier, MD  tetrahydrozoline 0.05 % ophthalmic solution Place 1 drop into both eyes daily as needed (dry eye).    Yes [provider]     Family History  Problem Relation Age of Onset  .  Alzheimer's disease Mother   . Alzheimer's disease Brother   . Alzheimer's disease Sister   . Breast cancer Sister   . CAD Brother 30       CABG  . Cancer Sister        Breast  . Cancer Brother        Lung    Social History   Social History  . Marital status: Widowed    Spouse name: Lanae Boast  . Number of children: 3  . Years of education: N/A   Occupational History  . Retired    Social History Main Topics  . Smoking status: Never Smoker  . Smokeless tobacco: Never Used  . Alcohol use No  . Drug use: No  . Sexual activity: Not on file   Other Topics Concern  . Not on file   Social History Narrative   Lives at home with husband, Lanae Boast who is in poor health (she is caregiver)   Has #3 daughters   Retired from working at [employer]   Has total of #7 siblings    ECOG Status: 1 - Symptomatic but completely ambulatory  Review of Systems: A 12 point ROS discussed and pertinent positives are indicated in the HPI above.  All other systems are negative.  Review of Systems  Vital Signs: BP (!) 156/63   Pulse 63   Temp 98 F (36.7 C) (Oral)   Resp 14   Ht 5\' 5"  (1.651 m)   Wt 143 lb (64.9 kg)   SpO2 97%   BMI 23.80 kg/m   Physical Exam  Constitutional: She is oriented to person, place, and time. She appears well-developed and well-nourished. No distress.  Eyes: Conjunctivae are normal. No scleral icterus.  Cardiovascular: Normal rate, regular rhythm, normal heart sounds and intact distal pulses.   Pulmonary/Chest: Effort normal and breath sounds normal. No respiratory distress.  Abdominal: Soft. Bowel sounds are normal. She exhibits no distension.  Musculoskeletal: Normal range of motion. She exhibits no edema or tenderness.  Neurological: She is alert and oriented to person, place, and time.  Skin: She is not diaphoretic.  Psychiatric: She has a normal mood and affect. Her behavior is normal.      Imaging: Mr Abdomen Wwo Contrast  Result Date:  10/09/2017 CLINICAL DATA:  Follow-up Y-90 SIRT for right hepatic cholangiocarcinoma EXAM: MRI ABDOMEN WITHOUT AND WITH CONTRAST TECHNIQUE: Multiplanar multisequence MR imaging of the abdomen was performed both before and after the administration of intravenous contrast. CONTRAST:  16mL MULTIHANCE GADOBENATE DIMEGLUMINE 529 MG/ML IV SOLN COMPARISON:  MRI abdomen dated 06/11/2017 FINDINGS: Lower chest: Lung bases are clear. Cardiomegaly. Hepatobiliary: Geographic T2 signal abnormality in segment 6, measuring 6.1 x 6.6 x 4.8 cm (series 8/image 36), reflecting a combination of post treatment change and underlying tumor. Following contrast administration, multiple distinct underlying lesions are present, including a dominant 3.5 cm  lesion with peripheral enhancement (series 13/image 36). At least 5 separate lesions are present, and while comparison is difficult, the smaller lesions are believed to have progressed when compared to prior MRI. Status post cholecystectomy. No intrahepatic or extrahepatic ductal dilatation. Pancreas: Stable 1.3 x 1.9 cm mildly complex/septated cystic lesion along the anterior aspect of the pancreatic uncinate process (series 8/ image 48). Suspected communication with the main pancreatic duct. No pancreatic dilatation. No pancreatic atrophy. Spleen:  Within normal limits. Adrenals/Urinary Tract:  Adrenal glands are within normal limits. 7 mm cyst along the anterior interpolar left kidney (series 8/ image 40). Bilateral renal sinus cysts. No hydronephrosis. Stomach/Bowel: Stomach is within normal limits. Visualized bowel is unremarkable. Vascular/Lymphatic:  No evidence of abdominal aortic aneurysm. 11 mm short axis gastrohepatic node (series 3/ image 28), similar. Other:  No abdominal ascites. Musculoskeletal: No focal osseous lesions. IMPRESSION: Suspected progression of intrahepatic cholangiocarcinoma within segment 6 of the liver, with surrounding post remained changes. At least 5 distinct  lesions are present, including a dominant lesion measuring 3.5 cm, as described above. Stable 1.9 cm mildly complex/ septated cystic lesion along the anterior aspect of the pancreatic uncinate process, likely reflecting a pseudocyst or side branch IPMN. Additional ancillary findings as above. Electronically Signed   By: Julian Hy M.D.   On: 10/09/2017 09:03   Mr 3d Recon At Scanner  Result Date: 10/09/2017 CLINICAL DATA:  Follow-up Y-90 SIRT for right hepatic cholangiocarcinoma EXAM: MRI ABDOMEN WITHOUT AND WITH CONTRAST TECHNIQUE: Multiplanar multisequence MR imaging of the abdomen was performed both before and after the administration of intravenous contrast. CONTRAST:  51mL MULTIHANCE GADOBENATE DIMEGLUMINE 529 MG/ML IV SOLN COMPARISON:  MRI abdomen dated 06/11/2017 FINDINGS: Lower chest: Lung bases are clear. Cardiomegaly. Hepatobiliary: Geographic T2 signal abnormality in segment 6, measuring 6.1 x 6.6 x 4.8 cm (series 8/image 36), reflecting a combination of post treatment change and underlying tumor. Following contrast administration, multiple distinct underlying lesions are present, including a dominant 3.5 cm lesion with peripheral enhancement (series 13/image 36). At least 5 separate lesions are present, and while comparison is difficult, the smaller lesions are believed to have progressed when compared to prior MRI. Status post cholecystectomy. No intrahepatic or extrahepatic ductal dilatation. Pancreas: Stable 1.3 x 1.9 cm mildly complex/septated cystic lesion along the anterior aspect of the pancreatic uncinate process (series 8/ image 48). Suspected communication with the main pancreatic duct. No pancreatic dilatation. No pancreatic atrophy. Spleen:  Within normal limits. Adrenals/Urinary Tract:  Adrenal glands are within normal limits. 7 mm cyst along the anterior interpolar left kidney (series 8/ image 40). Bilateral renal sinus cysts. No hydronephrosis. Stomach/Bowel: Stomach is within  normal limits. Visualized bowel is unremarkable. Vascular/Lymphatic:  No evidence of abdominal aortic aneurysm. 11 mm short axis gastrohepatic node (series 3/ image 28), similar. Other:  No abdominal ascites. Musculoskeletal: No focal osseous lesions. IMPRESSION: Suspected progression of intrahepatic cholangiocarcinoma within segment 6 of the liver, with surrounding post remained changes. At least 5 distinct lesions are present, including a dominant lesion measuring 3.5 cm, as described above. Stable 1.9 cm mildly complex/ septated cystic lesion along the anterior aspect of the pancreatic uncinate process, likely reflecting a pseudocyst or side branch IPMN. Additional ancillary findings as above. Electronically Signed   By: Julian Hy M.D.   On: 10/09/2017 09:03   Mm Screening Breast Tomo Bilateral  Result Date: 10/02/2017 CLINICAL DATA:  Screening. EXAM: 2D DIGITAL SCREENING BILATERAL MAMMOGRAM WITH CAD AND ADJUNCT TOMO COMPARISON:  Previous exam(s). ACR  Breast Density Category b: There are scattered areas of fibroglandular density. FINDINGS: There are no findings suspicious for malignancy. Images were processed with CAD. IMPRESSION: No mammographic evidence of malignancy. A result letter of this screening mammogram will be mailed directly to the patient. RECOMMENDATION: Screening mammogram in one year. (Code:SM-B-01Y) BI-RADS CATEGORY  1: Negative. Electronically Signed   By: Ammie Ferrier M.D.   On: 10/02/2017 10:18    Labs:  CBC:  Recent Labs  11/04/16 0403 07/05/17 0816 07/19/17 0905 08/03/17 0811  WBC 5.5 2.9* 3.8* 3.9  HGB 11.5* 11.9* 11.9* 12.2  HCT 35.0* 35.0* 35.5* 36.2  PLT 216 200 202 260    COAGS:  Recent Labs  11/01/16 0830 07/05/17 0816  INR 0.91 0.95  APTT  --  27    BMP:  Recent Labs  11/01/16 0830  06/11/17 0824 07/05/17 0816 07/19/17 0905 10/09/17 0811  NA 140  --   --  140 139  --   K 3.9  --   --  3.9 4.1  --   CL 106  --   --  105 104  --     CO2 28  --   --  29 28  --   GLUCOSE 114*  --   --  118* 113*  --   BUN 13  --   --  13 17  --   CALCIUM 9.5  --   --  9.6 9.7  --   CREATININE 0.55  < > 0.60 0.53 0.54 0.50  GFRNONAA >60  --   --  >60 >60  --   GFRAA >60  --   --  >60 >60  --   < > = values in this interval not displayed.  LIVER FUNCTION TESTS:  Recent Labs  11/01/16 0830 07/05/17 0816 07/19/17 0905  BILITOT 0.6 0.6 0.4  AST 19 22 25   ALT 11* 16 15  ALKPHOS 107 103 111  PROT 7.2 7.3 7.7  ALBUMIN 4.2 4.2 4.3      Assessment and Plan:  3 months status post right hepatic Y 90 radio embolization for local progression of hepatic cholangiocarcinoma status post prior microwave ablation. Three-month imaging today demonstrates a stable ablation defect in the right inferior liver segment 6. Throughout segment 6, there is wedge shaped abnormal signal and enhancement surrounding the previous small satellite lesions compatible with post radiation changes. At this point, it is difficult to confirm actual progression of disease, "pseudo-progression" versus posttreatment changes at 3 months. There is no progression elsewhere in the liver or extrahepatic disease. No biliary dilatation. No signs of liver failure.  Imaging findings and assessment were reviewed with the patient and her daughters. After our discussion the patient would like to continue close surveillance with a repeat MRI scan in 2 months and at that time consider a second right hepatic Y 90 radio embolization.  Thank you for this interesting consult.  I greatly enjoyed meeting Laura [name] and look forward to participating in their care.  A copy of this report was sent to the requesting provider on this date.  Electronically Signed: Greggory Keen 10/09/2017, 12:53 PM   I spent a total of    40 Minutes in face to face in clinical consultation, greater than 50% of which was counseling/coordinating care for this patient with hepatic cholangiocarcinoma

## 2017-10-15 ENCOUNTER — Ambulatory Visit (HOSPITAL_BASED_OUTPATIENT_CLINIC_OR_DEPARTMENT_OTHER): Payer: PPO | Admitting: Oncology

## 2017-10-15 ENCOUNTER — Telehealth: Payer: Self-pay | Admitting: Oncology

## 2017-10-15 VITALS — BP 140/44 | HR 74 | Temp 97.7°F | Resp 16 | Ht 65.0 in | Wt 146.9 lb

## 2017-10-15 DIAGNOSIS — C801 Malignant (primary) neoplasm, unspecified: Secondary | ICD-10-CM

## 2017-10-15 DIAGNOSIS — C787 Secondary malignant neoplasm of liver and intrahepatic bile duct: Secondary | ICD-10-CM

## 2017-10-15 NOTE — Telephone Encounter (Signed)
Gave avs and calendar for January 2019 °

## 2017-10-15 NOTE — Progress Notes (Signed)
  [facility] OFFICE PROGRESS NOTE   Diagnosis: Cholangiocarcinoma  INTERVAL HISTORY:   Laura [name] returns as scheduled. She feels well. Good appetite and energy level. No complaint. An MRI of the liver 10/09/2017 with a persistent area of T2 signal abnormality in segment 6 with multiple distinct lesions on contrast administration. Smaller lesions are felt to be progressive. Stable complex cystic pancreas lesion.  She saw Dr. Aleene Davidson   Objective:  Vital signs in last 24 hours:  There were no vitals taken for this visit.    HEENT: Neck without mass Lymphatics: No cervical, supraclavicular, or axillary nodes Resp: Few scattered coarse rhonchi at the upper posterior chest on end inspiration, no respiratory distress Cardio: Regular rate and rhythm GI: No hepatosplenomegaly, no mass, mild tenderness in the right upper abdomen Vascular: No leg edema   Medications: I have reviewed the patient's current medications.  Assessment/Plan: 1. Right liver mass-biopsy 07/10/2016 confirmed adenocarcinoma  CT abdomen/pelvis 06/17/2016 and MRI abdomen 06/21/2016 confirmed an isolated right liver mass and a cystic pancreas lesion  PET scan 07/28/2016-the hepatic lesion is not hypermetabolic, hypermetabolic lesion in the gastric antrum suspicious for a primary neoplasm and no other evidence of metastatic disease  Upper endoscopy 08/03/2016-no mass found, biopsy from the gastric antrum-benign  CT-guided ablation of the right liver lesion 11/03/2016  MRI abdomen 03/08/2017-ablation defect in the right liver without residual/recurrent disease  MRI liver 06/11/2017-multiple new enhancing lesions in the right hepatic lobe surrounding the ablation defect consistent with progressive cholangiocarcinoma  Y-90 radioembolization of the right liver 07/19/2017  MRI abdomen 10/09/2017-suspected progression of intrahepatic larger carcinoma within segment 6, reviewed by interventional  radiology most consistent with pseudo-progression 2. Left leg and right foot pain  Negative left leg Doppler 07/14/2016  3. Right abdominal pain-resolved  4. Anorexia/weight loss-resolved  5. 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 The restaging MRI reveals progression within segment 6 versus pseudo-progression. Dr. Annamaria Boots discussed options with Laura [name] and her family. They decided to obtain a follow-up MRI in 2 months and decide on additional therapy then.  Laura [name] will return for an office visit here in early January. I am available to see her sooner as needed.  15 minutes were spent with the patient today. The majority of the time was used for counseling and coordination of care.  Donneta Romberg, MD  10/15/2017  7:48 AM

## 2017-10-24 ENCOUNTER — Encounter: Payer: Self-pay | Admitting: Interventional Radiology

## 2017-11-01 ENCOUNTER — Encounter: Payer: Self-pay | Admitting: Interventional Radiology

## 2017-11-22 DIAGNOSIS — H5203 Hypermetropia, bilateral: Secondary | ICD-10-CM | POA: Diagnosis not present

## 2017-11-22 DIAGNOSIS — H2513 Age-related nuclear cataract, bilateral: Secondary | ICD-10-CM | POA: Diagnosis not present

## 2017-12-26 ENCOUNTER — Other Ambulatory Visit (HOSPITAL_COMMUNITY): Payer: Self-pay | Admitting: Interventional Radiology

## 2017-12-26 ENCOUNTER — Other Ambulatory Visit: Payer: Self-pay | Admitting: Radiology

## 2017-12-26 DIAGNOSIS — R160 Hepatomegaly, not elsewhere classified: Secondary | ICD-10-CM

## 2017-12-28 ENCOUNTER — Other Ambulatory Visit (HOSPITAL_COMMUNITY): Payer: Self-pay | Admitting: Interventional Radiology

## 2017-12-31 ENCOUNTER — Ambulatory Visit: Payer: PPO | Admitting: Oncology

## 2018-01-04 ENCOUNTER — Telehealth: Payer: Self-pay | Admitting: Oncology

## 2018-01-04 NOTE — Telephone Encounter (Signed)
Spoke to patient regarding upcoming January appointments per 1/9 sch message.  °

## 2018-01-08 ENCOUNTER — Encounter: Payer: Self-pay | Admitting: *Deleted

## 2018-01-08 ENCOUNTER — Ambulatory Visit
Admission: RE | Admit: 2018-01-08 | Discharge: 2018-01-08 | Disposition: A | Discharge status: Home / Self Care | Payer: PPO | Source: Ambulatory Visit | Attending: Interventional Radiology | Admitting: Interventional Radiology

## 2018-01-08 ENCOUNTER — Ambulatory Visit (HOSPITAL_COMMUNITY)
Admission: RE | Admit: 2018-01-08 | Discharge: 2018-01-08 | Disposition: A | Discharge status: Home / Self Care | Payer: PPO | Source: Ambulatory Visit | Attending: Interventional Radiology | Admitting: Interventional Radiology

## 2018-01-08 DIAGNOSIS — C220 Liver cell carcinoma: Secondary | ICD-10-CM | POA: Diagnosis not present

## 2018-01-08 DIAGNOSIS — K869 Disease of pancreas, unspecified: Secondary | ICD-10-CM | POA: Diagnosis not present

## 2018-01-08 DIAGNOSIS — R160 Hepatomegaly, not elsewhere classified: Secondary | ICD-10-CM | POA: Diagnosis not present

## 2018-01-08 DIAGNOSIS — C221 Intrahepatic bile duct carcinoma: Secondary | ICD-10-CM | POA: Diagnosis not present

## 2018-01-08 DIAGNOSIS — Z9889 Other specified postprocedural states: Secondary | ICD-10-CM | POA: Diagnosis not present

## 2018-01-08 HISTORY — PX: IR RADIOLOGIST EVAL & MGMT: IMG5224

## 2018-01-08 LAB — POCT I-STAT CREATININE: CREATININE: 0.7 mg/dL (ref 0.44–1.00)

## 2018-01-08 MED ORDER — GADOBENATE DIMEGLUMINE 529 MG/ML IV SOLN
15.0000 mL | Freq: Once | INTRAVENOUS | Status: AC | PRN
  Administered 2018-01-08: 13 mL via INTRAVENOUS

## 2018-01-08 NOTE — Progress Notes (Signed)
Patient ID: Laura [name], female   DOB: 1941/02/17, 77 y.o.   MRN: [id number]       Chief Complaint: Hepatic cholangiocarcinoma   Referring Physician(s): Michaelah Credeur  History of Present Illness: Laura [name] is a 77 y.o. female with biopsy-proven right hepatic cholangiocarcinoma. She was initially treated with image guided microwave ablation. Surveillance imaging confirmed local progression after 7 months. She underwent successful right hepatic Y 90 radio embolization in July 2018. She now returns for six-month follow-up outpatient visit with imaging. Over the last 6 months she remains asymptomatic. No significant abdominal pain or flank pain. No nausea or vomiting. No signs of jaundice or liver failure. Stable weight and appetite. Stable energy level and functional status. She is back to her baseline.  Past Medical History:  Diagnosis Date  . Cancer ([place])   . GERD (gastroesophageal reflux disease)   . Heart murmur   . High cholesterol   . Hypertension   . IBS (irritable bowel syndrome)   . PONV (postoperative nausea and vomiting)   . Pre-diabetes     Past Surgical History:  Procedure Laterality Date  . BREAST EXCISIONAL BIOPSY Right 1960   scar is not visible   . BREAST SURGERY     Benign  . CHOLECYSTECTOMY    . IR ANGIOGRAM SELECTIVE EACH ADDITIONAL VESSEL  07/05/2017  . IR ANGIOGRAM SELECTIVE EACH ADDITIONAL VESSEL  07/05/2017  . IR ANGIOGRAM SELECTIVE EACH ADDITIONAL VESSEL  07/05/2017  . IR ANGIOGRAM SELECTIVE EACH ADDITIONAL VESSEL  07/19/2017  . IR ANGIOGRAM VISCERAL SELECTIVE  07/05/2017  . IR ANGIOGRAM VISCERAL SELECTIVE  07/05/2017  . IR ANGIOGRAM VISCERAL SELECTIVE  07/19/2017  . IR EMBO ARTERIAL NOT HEMORR HEMANG INC GUIDE ROADMAPPING  07/05/2017  . IR EMBO TUMOR ORGAN ISCHEMIA INFARCT INC GUIDE ROADMAPPING  07/19/2017  . IR GENERIC HISTORICAL  09/27/2016   IR RADIOLOGIST EVAL & MGMT 09/27/2016 Greggory Keen, MD [facility] INTERV RAD  . IR GENERIC HISTORICAL   12/07/2016   IR RADIOLOGIST EVAL & MGMT 12/07/2016 Greggory Keen, MD [facility] INTERV RAD  . IR RADIOLOGIST EVAL & MGMT  03/08/2017  . IR RADIOLOGIST EVAL & MGMT  08/16/2017  . IR RADIOLOGIST EVAL & MGMT  10/09/2017  . IR RADIOLOGIST EVAL & MGMT  06/14/2017  . IR US GUIDE VASC ACCESS RIGHT  07/05/2017  . IR US GUIDE VASC ACCESS RIGHT  07/19/2017  . TOTAL ABDOMINAL HYSTERECTOMY      Allergies: Pneumococcal vaccines and Codeine  Medications: Prior to Admission medications   Medication Sig Start Date End Date Taking? Authorizing Provider  amLODipine (NORVASC) 5 MG tablet Take 5 mg by mouth daily.  06/22/14  Yes [provider]  aspirin EC 81 MG tablet Take 81 mg by mouth daily.   Yes [provider]  Biotin 5000 MCG TABS Take 5,000 mcg by mouth every other day.   Yes [provider]  Cholecalciferol (VITAMIN D3) 5000 units CAPS Take 5,000 Units by mouth daily.   Yes [provider]  clonazePAM (KLONOPIN) 0.5 MG tablet Take 0.25-0.5 mg by mouth. Take 0.25mg s twice daily and 0.5mg s at bedtime, may take an additional 0.25mg s as needed for anxiety   Yes [provider]  docusate sodium (COLACE) 50 MG capsule Take 50 mg by mouth 3 (three) times daily as needed for mild constipation.   Yes [provider]  doxazosin (CARDURA) 1 MG tablet Take 1 mg by mouth at bedtime.  06/22/14  Yes [provider]  omeprazole ([place])  20 MG capsule Take 20 mg by mouth 2 (two) times daily. 06/25/17  Yes [provider]  Jonetta Speak LANCETS 74Q [place]  12/09/16  Yes [provider]  pravastatin (PRAVACHOL) 40 MG tablet Take 40 mg by mouth every evening.    Yes [provider]  prochlorperazine (COMPAZINE) 5 MG tablet Take 1 tablet (5 mg total) by mouth every 6 (six) hours as needed for nausea or vomiting. 08/03/17  Yes Ladell Pier, MD  tetrahydrozoline 0.05 % ophthalmic solution Place 1 drop into both eyes daily as needed (dry  eye).    Yes [provider]     Family History  Problem Relation Age of Onset  . Alzheimer's disease Mother   . Alzheimer's disease Brother   . Alzheimer's disease Sister   . Breast cancer Sister   . CAD Brother 55       CABG  . Cancer Sister        Breast  . Cancer Brother        Lung    Social History   Socioeconomic History  . Marital status: Widowed    Spouse name: Lanae Boast  . Number of children: 3  . Years of education: Not on file  . Highest education level: Not on file  Social Needs  . Financial resource strain: Not on file  . Food insecurity - worry: Not on file  . Food insecurity - inability: Not on file  . Transportation needs - medical: Not on file  . Transportation needs - non-medical: Not on file  Occupational History  . Occupation: Retired  Tobacco Use  . Smoking status: Never Smoker  . Smokeless tobacco: Never Used  Substance and Sexual Activity  . Alcohol use: No  . Drug use: No  . Sexual activity: Not on file  Other Topics Concern  . Not on file  Social History Narrative   Lives at home with husband, Lanae Boast who is in poor health (she is caregiver)   Has #3 daughters   Retired from working at [employer]   Has total of #7 siblings    ECOG Status: 1 - Symptomatic but completely ambulatory  Review of Systems: A 12 point ROS discussed and pertinent positives are indicated in the HPI above.  All other systems are negative.  Review of Systems  Vital Signs: BP (!) 156/59   Pulse 71   Temp 97.6 F (36.4 C) (Oral)   Resp 14   Ht 5\' 5"  (1.651 m)   Wt 144 lb (65.3 kg)   SpO2 99%   BMI 23.96 kg/m   Physical Exam  Constitutional: She is oriented to person, place, and time. She appears well-developed and well-nourished. No distress.  Eyes: Conjunctivae are normal. No scleral icterus.  Cardiovascular: Normal rate, regular rhythm and normal heart sounds.  No murmur heard. Pulmonary/Chest: Effort normal and breath sounds normal.  No respiratory distress.  Abdominal: Soft. Bowel sounds are normal. She exhibits no distension.  Musculoskeletal: Normal range of motion. She exhibits no edema.  Neurological: She is alert and oriented to person, place, and time.  Skin: She is not diaphoretic.     Imaging: Mr Abdomen Wwo Contrast  Result Date: 01/08/2018 CLINICAL DATA:  Thermal ablation of right hepatic cholangiocarcinoma. EXAM: MRI ABDOMEN WITHOUT AND WITH CONTRAST TECHNIQUE: Multiplanar multisequence MR imaging of the abdomen was performed both before and after the administration of intravenous contrast. CONTRAST:  13 cc MultiHance COMPARISON:  10/09/2017 FINDINGS: Lower chest: Unremarkable. Hepatobiliary:  Previously described geographic T2 signal abnormality in segment 6 measures minimally smaller today at 5.7 x 5.0 cm compared to 6.6 x 6.1 cm previously. Imaging after IV contrast administration again shows multiple enhancing foci of abnormal enhancement in the segment VI ablation bed. Overall appearance is very similar to the prior study without evidence for substantial disease progression. There is a new tiny focus of arterial phase hyperenhancement in the lateral segment of the left liver (see image 55 of series 901). This portion of liver becomes isointense to background liver parenchyma on all other postcontrast sequences and is isointense on the precontrast images. No associated lesion is evident and this may reflect a transient hepatic intensity difference related to arterial portal shunt. Gallbladder surgically absent. No intrahepatic or extrahepatic biliary dilation. 1 No focal mass lesion. No dilatation of the main duct. No intraparenchymal cyst. No peripancreatic edema. Pancreas: 1.3 x 1.9 cm mildly complex cystic lesion in the anterior head of pancreas is similar to prior measuring 1.1 x 2.0 cm today. No dilatation of the main duct. Spleen:  No splenomegaly. No focal mass lesion. Adrenals/Urinary Tract: No adrenal nodule or  mass. Tiny cortical cysts noted left kidney. Central sinus cysts are identified in both kidneys. Stomach/Bowel: Stomach is nondistended. No gastric wall thickening. No evidence of outlet obstruction. Duodenum is normally positioned as is the ligament of Treitz. No small bowel or colonic dilatation within the visualized abdomen. Vascular/Lymphatic: No abdominal aortic aneurysm. No abdominal lymphadenopathy. Upper normal gastrohepatic ligament lymph node is stable. Other:  No intraperitoneal free fluid. Musculoskeletal: No abnormal marrow signal within the visualized bony anatomy IMPRESSION: 1. Similar appearance of the segment VI lesion in the liver suggesting residual neoplasm. Heterogeneous enhancement and multiple small rim enhancing lesions identified, similar to prior. No definite evidence of disease progression at this site on the current study. 2. Interval development of a tiny hypervascular lesion in the subcapsular lateral segment left liver. No underlying lesion is evident and transient hepatic intensity difference related to arterial portal shunt would be a consideration. Attention on follow-up recommended. 3. Stable appearance of the 2 cm complex cystic lesion anterior pancreatic head. Imaging features remain compatible with pseudocyst or side branch IPMN. Electronically Signed   By: Misty Stanley M.D.   On: 01/08/2018 14:16    Labs:  CBC: Recent Labs    07/05/17 0816 07/19/17 0905 08/03/17 0811  WBC 2.9* 3.8* 3.9  HGB 11.9* 11.9* 12.2  HCT 35.0* 35.5* 36.2  PLT 200 202 260    COAGS: Recent Labs    07/05/17 0816  INR 0.95  APTT 27    BMP: Recent Labs    07/05/17 0816 07/19/17 0905 10/09/17 0811 01/08/18 1307  NA 140 139  --   --   K 3.9 4.1  --   --   CL 105 104  --   --   CO2 29 28  --   --   GLUCOSE 118* 113*  --   --   BUN 13 17  --   --   CALCIUM 9.6 9.7  --   --   CREATININE 0.53 0.54 0.50 0.70  GFRNONAA >60 >60  --   --   GFRAA >60 >60  --   --     LIVER  FUNCTION TESTS: Recent Labs    07/05/17 0816 07/19/17 0905  BILITOT 0.6 0.4  AST 22 25  ALT 16 15  ALKPHOS 103 111  PROT 7.3 7.7  ALBUMIN 4.2 4.3  TUMOR MARKERS: No results for input(s): AFPTM, CEA, CA199, CHROMGRNA in the last 8760 hours.  Assessment and Plan:  6 months status post right hepatic Y 90 radio embolization for a biopsy-proven hepatic cholangiocarcinoma. MR imaging today is stable with some residual MRI changes in the right hepatic lobe compatible with stable disease/posttreatment changes. No interval progression.  Plan: Continue close surveillance with a repeat MRI at 3 months. If there is definitive evidence of progression by MRI she certainly could have another right hepatic Y 90 radio embolization. All questions addressed. The patient and her daughter are in agreement with this plan.   Electronically Signed: Greggory Keen 01/08/2018, 3:29 PM   I spent a total of    25 Minutes in face to face in clinical consultation, greater than 50% of which was counseling/coordinating care for this patient with right hepatic cholangiocarcinoma.

## 2018-01-14 ENCOUNTER — Inpatient Hospital Stay: Payer: PPO | Attending: Oncology | Admitting: Oncology

## 2018-01-14 VITALS — BP 128/43 | HR 63 | Temp 97.6°F | Resp 18 | Ht 65.0 in | Wt 147.8 lb

## 2018-01-14 DIAGNOSIS — R42 Dizziness and giddiness: Secondary | ICD-10-CM

## 2018-01-14 DIAGNOSIS — C787 Secondary malignant neoplasm of liver and intrahepatic bile duct: Secondary | ICD-10-CM

## 2018-01-14 DIAGNOSIS — K862 Cyst of pancreas: Secondary | ICD-10-CM

## 2018-01-14 DIAGNOSIS — C801 Malignant (primary) neoplasm, unspecified: Secondary | ICD-10-CM | POA: Diagnosis not present

## 2018-01-14 NOTE — Progress Notes (Signed)
  [place] OFFICE PROGRESS NOTE   Diagnosis: Liver mass  INTERVAL HISTORY:   Laura [name] returns as scheduled.  She reports feeling well.  Good appetite.  She feels "dizzy "when standing and moving at times.  No syncope. She underwent a restaging MRI of the abdomen on 01/08/2018.  The area of T2 abnormality in segment 6 measured slightly smaller.  Multiple areas of enhancement are noted in the segment 6 ablation bed-unchanged.  A new tiny focus of arterial phase hyperenhancement in the lateral left liver was without an associated lesion and felt to potentially represent a vascular phenomenon.  Objective:  Vital signs in last 24 hours:  Blood pressure (!) 128/43, pulse 63, temperature 97.6 F (36.4 C), temperature source Oral, resp. rate 18, height 5\' 5"  (1.651 m), weight 147 lb 12.8 oz (67 kg), SpO2 100 %.    HEENT: Neck without mass Lymphatics: No cervical or supraclavicular nodes Resp: Lungs clear bilaterally Cardio: Regular rate and rhythm GI: No hepatosplenomegaly, no mass, nontender Vascular: No leg edema     Medications: I have reviewed the patient's current medications.   Assessment/Plan: 1. Right liver mass-biopsy 07/10/2016 confirmed adenocarcinoma  CT abdomen/pelvis 06/17/2016 and MRI abdomen 06/21/2016 confirmed an isolated right liver mass and a cystic pancreas lesion  PET scan 07/28/2016-the hepatic lesion is not hypermetabolic, hypermetabolic lesion in the gastric antrum suspicious for a primary neoplasm and no other evidence of metastatic disease  Upper endoscopy 08/03/2016-no mass found, biopsy from the gastric antrum-benign  CT-guided ablation of the right liver lesion 11/03/2016  MRI abdomen 03/08/2017-ablation defect in the right liver without residual/recurrent disease  MRI liver 06/11/2017-multiple new enhancing lesions in the right hepatic lobe surrounding the ablation defect consistent with progressive cholangiocarcinoma  Y-90  radioembolization of the right liver 07/19/2017  MRI abdomen 10/09/2017-suspected progression of intrahepatic larger carcinoma within segment 6, reviewed by interventional radiology most consistent with pseudo-progression  MRI abdomen 01/08/2018- slight decrease in the segment 6 mass, no change in enhancement at the ablation bed, new tiny arterial phase area of hyperenhancement in the lateral left liver-potentially a vascular phenomena 2. Left leg and right foot pain  Negative left leg Doppler 07/14/2016  3. Right abdominal pain-resolved  4. Anorexia/weight loss-resolved  5. Benign appearing 16 mm cystic uncinatepancreas lesion noted on MRI 06/20/2016   Slightly larger on an MRI 03/08/2017, felt to most likely represent a side branch intraductal papillary mucinous neoplasm  Stable on MRI 10/09/2017  Stable on MRI 01/08/2018    Disposition: She appears unchanged.  The MRI shows no evidence of disease progression.  She will be scheduled for a repeat MRI in 3 months.  I will recommend increasing the MRI interval if there is stability on the next scan.  I recommended she follow-up with her primary physician if the "dizzy "episodes persist.  She will return for an office visit after the MRI in 3 months.  15 minutes were spent with the patient today.  The majority of the time was used for counseling and coordination of care.  [name], MD  01/14/2018  8:29 AM

## 2018-01-16 ENCOUNTER — Telehealth: Payer: Self-pay | Admitting: Oncology

## 2018-01-16 NOTE — Telephone Encounter (Signed)
Scheduled appt per 1/21 los - office week of 4/22 - Sent reminder letter in the mail with apt date and time.

## 2018-02-05 DIAGNOSIS — R82998 Other abnormal findings in urine: Secondary | ICD-10-CM | POA: Diagnosis not present

## 2018-02-05 DIAGNOSIS — I10 Essential (primary) hypertension: Secondary | ICD-10-CM | POA: Diagnosis not present

## 2018-02-05 DIAGNOSIS — R7302 Impaired glucose tolerance (oral): Secondary | ICD-10-CM | POA: Diagnosis not present

## 2018-02-05 DIAGNOSIS — E7800 Pure hypercholesterolemia, unspecified: Secondary | ICD-10-CM | POA: Diagnosis not present

## 2018-02-12 DIAGNOSIS — Z1389 Encounter for screening for other disorder: Secondary | ICD-10-CM | POA: Diagnosis not present

## 2018-02-12 DIAGNOSIS — Z6824 Body mass index (BMI) 24.0-24.9, adult: Secondary | ICD-10-CM | POA: Diagnosis not present

## 2018-02-12 DIAGNOSIS — I10 Essential (primary) hypertension: Secondary | ICD-10-CM | POA: Diagnosis not present

## 2018-02-12 DIAGNOSIS — I5189 Other ill-defined heart diseases: Secondary | ICD-10-CM | POA: Diagnosis not present

## 2018-02-12 DIAGNOSIS — Z23 Encounter for immunization: Secondary | ICD-10-CM | POA: Diagnosis not present

## 2018-02-12 DIAGNOSIS — C228 Malignant neoplasm of liver, primary, unspecified as to type: Secondary | ICD-10-CM | POA: Diagnosis not present

## 2018-02-12 DIAGNOSIS — D6489 Other specified anemias: Secondary | ICD-10-CM | POA: Diagnosis not present

## 2018-02-12 DIAGNOSIS — Z0000 Encounter for general adult medical examination without abnormal findings: Secondary | ICD-10-CM | POA: Diagnosis not present

## 2018-02-12 DIAGNOSIS — I517 Cardiomegaly: Secondary | ICD-10-CM | POA: Diagnosis not present

## 2018-02-12 DIAGNOSIS — F430 Acute stress reaction: Secondary | ICD-10-CM | POA: Diagnosis not present

## 2018-02-12 DIAGNOSIS — D72819 Decreased white blood cell count, unspecified: Secondary | ICD-10-CM | POA: Diagnosis not present

## 2018-02-12 DIAGNOSIS — D692 Other nonthrombocytopenic purpura: Secondary | ICD-10-CM | POA: Diagnosis not present

## 2018-02-12 DIAGNOSIS — J302 Other seasonal allergic rhinitis: Secondary | ICD-10-CM | POA: Diagnosis not present

## 2018-02-14 DIAGNOSIS — Z1212 Encounter for screening for malignant neoplasm of rectum: Secondary | ICD-10-CM | POA: Diagnosis not present

## 2018-03-01 ENCOUNTER — Ambulatory Visit: Payer: PPO | Admitting: Podiatry

## 2018-03-01 ENCOUNTER — Encounter: Payer: Self-pay | Admitting: Podiatry

## 2018-03-01 DIAGNOSIS — M79675 Pain in left toe(s): Secondary | ICD-10-CM

## 2018-03-01 DIAGNOSIS — M79674 Pain in right toe(s): Secondary | ICD-10-CM | POA: Diagnosis not present

## 2018-03-01 DIAGNOSIS — L600 Ingrowing nail: Secondary | ICD-10-CM

## 2018-03-01 DIAGNOSIS — B351 Tinea unguium: Secondary | ICD-10-CM

## 2018-03-01 NOTE — Progress Notes (Signed)
Subjective:    Patient ID: Laura [name], female    DOB: 30-Apr-1941, 77 y.o.   MRN: [id number]  HPI 77 year old female presents the office today for concerns of thick, painful, elongated toenails that she cannot trim herself.  She states they do cause irritation set issues with pressure.  She said no treatment for the nail fungus.  She states the right big toenail gets ingrown azygous elongated but she denies any redness or drainage from the ear limits at this time. She has no other concerns today.    Review of Systems  All other systems reviewed and are negative.  Past Medical History:  Diagnosis Date  . Cancer ([place])   . GERD (gastroesophageal reflux disease)   . Heart murmur   . High cholesterol   . Hypertension   . IBS (irritable bowel syndrome)   . PONV (postoperative nausea and vomiting)   . Pre-diabetes     Past Surgical History:  Procedure Laterality Date  . BREAST EXCISIONAL BIOPSY Right 1960   scar is not visible   . BREAST SURGERY     Benign  . CHOLECYSTECTOMY    . IR ANGIOGRAM SELECTIVE EACH ADDITIONAL VESSEL  07/05/2017  . IR ANGIOGRAM SELECTIVE EACH ADDITIONAL VESSEL  07/05/2017  . IR ANGIOGRAM SELECTIVE EACH ADDITIONAL VESSEL  07/05/2017  . IR ANGIOGRAM SELECTIVE EACH ADDITIONAL VESSEL  07/19/2017  . IR ANGIOGRAM VISCERAL SELECTIVE  07/05/2017  . IR ANGIOGRAM VISCERAL SELECTIVE  07/05/2017  . IR ANGIOGRAM VISCERAL SELECTIVE  07/19/2017  . IR EMBO ARTERIAL NOT HEMORR HEMANG INC GUIDE ROADMAPPING  07/05/2017  . IR EMBO TUMOR ORGAN ISCHEMIA INFARCT INC GUIDE ROADMAPPING  07/19/2017  . IR GENERIC HISTORICAL  09/27/2016   IR RADIOLOGIST EVAL & MGMT 09/27/2016 Greggory Keen, MD [facility] INTERV RAD  . IR GENERIC HISTORICAL  12/07/2016   IR RADIOLOGIST EVAL & MGMT 12/07/2016 Greggory Keen, MD [facility] INTERV RAD  . IR RADIOLOGIST EVAL & MGMT  03/08/2017  . IR RADIOLOGIST EVAL & MGMT  08/16/2017  . IR RADIOLOGIST EVAL & MGMT  10/09/2017  . IR RADIOLOGIST EVAL & MGMT  06/14/2017    . IR RADIOLOGIST EVAL & MGMT  01/08/2018  . IR US GUIDE VASC ACCESS RIGHT  07/05/2017  . IR US GUIDE VASC ACCESS RIGHT  07/19/2017  . TOTAL ABDOMINAL HYSTERECTOMY       Current Outpatient Medications:  .  amLODipine (NORVASC) 5 MG tablet, Take 5 mg by mouth daily. , Disp: , Rfl:  .  aspirin EC 81 MG tablet, Take 81 mg by mouth daily., Disp: , Rfl:  .  Biotin 5000 MCG TABS, Take 5,000 mcg by mouth every other day., Disp: , Rfl:  .  Cholecalciferol (VITAMIN D3) 5000 units CAPS, Take 5,000 Units by mouth daily., Disp: , Rfl:  .  clonazePAM (KLONOPIN) 0.5 MG tablet, Take 0.25-0.5 mg by mouth. Take 0.25mg s twice daily and 0.5mg s at bedtime, may take an additional 0.25mg s as needed for anxiety, Disp: , Rfl:  .  docusate sodium (COLACE) 50 MG capsule, Take 50 mg by mouth 3 (three) times daily as needed for mild constipation., Disp: , Rfl:  .  doxazosin (CARDURA) 1 MG tablet, Take 1 mg by mouth at bedtime. , Disp: , Rfl:  .  omeprazole (PRILOSEC) 20 MG capsule, Take 20 mg by mouth 2 (two) times daily., Disp: , Rfl:  .  ONETOUCH DELICA LANCETS 60Y MISC, , Disp: , Rfl:  .  pravastatin (PRAVACHOL) 40 MG tablet, Take  40 mg by mouth every evening. , Disp: , Rfl:  .  prochlorperazine (COMPAZINE) 5 MG tablet, Take 1 tablet (5 mg total) by mouth every 6 (six) hours as needed for nausea or vomiting., Disp: 30 tablet, Rfl: 2 .  tetrahydrozoline 0.05 % ophthalmic solution, Place 1 drop into both eyes daily as needed (dry eye). , Disp: , Rfl:   Allergies  Allergen Reactions  . Pneumococcal Vaccines Swelling, Palpitations and Other (See Comments)    Fever  . Codeine Other (See Comments)    Looses balance and feels light headed    Social History   Socioeconomic History  . Marital status: Widowed    Spouse name: Lanae Boast  . Number of children: 3  . Years of education: Not on file  . Highest education level: Not on file  Social Needs  . Financial resource strain: Not on file  . Food insecurity - worry:  Not on file  . Food insecurity - inability: Not on file  . Transportation needs - medical: Not on file  . Transportation needs - non-medical: Not on file  Occupational History  . Occupation: Retired  Tobacco Use  . Smoking status: Never Smoker  . Smokeless tobacco: Never Used  Substance and Sexual Activity  . Alcohol use: No  . Drug use: No  . Sexual activity: Not on file  Other Topics Concern  . Not on file  Social History Narrative   Lives at home with husband, Lanae Boast who is in poor health (she is caregiver)   Has #3 daughters   Retired from working at [employer]   Has total of #7 siblings        Objective:   Physical Exam General: AAO x3, NAD- presents today with her sister  Dermatological: Nails are hypertrophic, dystrophic, brittle, discolored, elongated 10. Most notably the right hallux toenail is ingrown and there is tenderness to the distal medial and lateral portion of the nails but there is no pain along the proximal nail folds. No surrounding redness or drainage. Tenderness nails 1-5 bilaterally. No open lesions or pre-ulcerative lesions are identified today.   Vascular: Dorsalis Pedis artery and Posterior Tibial artery pedal pulses are 2/4 bilateral with immedate capillary fill time. . There is no pain with calf compression, swelling, warmth, erythema.   Neruologic: Grossly intact via light touch bilateral.  Protective threshold with Semmes Wienstein monofilament intact to all pedal sites bilateral.   Musculoskeletal: No gross boney pedal deformities bilateral. No pain, crepitus, or limitation noted with foot and ankle range of motion bilateral. Muscular strength 5/5 in all groups tested bilateral.     Assessment & Plan:  77 year old female with symptomatic onychomycosis, ingrown toenail right hallux toenail. -Treatment options discussed including all alternatives, risks, and complications -Etiology of symptoms were discussed -We discussed partial nail  avulsion but she declines this-also her pain is towards the distal portion of the nail only.  Debridement is not helpful we can do a partial nail avulsion. -Nails debrided 10 without complications or bleeding -We discussed treatment options for nail fungus.  Given the thickening of the many of the prescription topicals will be helpful given her liver cancer we will hold off on oral medication.  We discussed more natural treatments including coconut oil/tea tree oil. -Daily foot inspection -Follow-up in 3 months or sooner if any problems arise. In the meantime, encouraged to call the office with any questions, concerns, change in symptoms.   Celesta Gentile, DPM

## 2018-03-01 NOTE — Patient Instructions (Signed)
You can mix 2 tablespoons of coconut oil and 10-15 drops of tea tree oil together and apply to nails once a day  If was nice to meet you today. If you have any questions or any further concerns, please feel fee to give me a call. You can call our office at [phone] or please feel fee to send me a message through [facility].

## 2018-03-20 DIAGNOSIS — L570 Actinic keratosis: Secondary | ICD-10-CM | POA: Diagnosis not present

## 2018-03-20 DIAGNOSIS — L820 Inflamed seborrheic keratosis: Secondary | ICD-10-CM | POA: Diagnosis not present

## 2018-03-20 DIAGNOSIS — X32XXXD Exposure to sunlight, subsequent encounter: Secondary | ICD-10-CM | POA: Diagnosis not present

## 2018-03-20 DIAGNOSIS — D225 Melanocytic nevi of trunk: Secondary | ICD-10-CM | POA: Diagnosis not present

## 2018-03-20 DIAGNOSIS — B078 Other viral warts: Secondary | ICD-10-CM | POA: Diagnosis not present

## 2018-03-25 ENCOUNTER — Other Ambulatory Visit (HOSPITAL_COMMUNITY): Payer: Self-pay | Admitting: Interventional Radiology

## 2018-03-25 ENCOUNTER — Other Ambulatory Visit: Payer: Self-pay | Admitting: *Deleted

## 2018-03-25 DIAGNOSIS — C221 Intrahepatic bile duct carcinoma: Secondary | ICD-10-CM

## 2018-03-25 DIAGNOSIS — R160 Hepatomegaly, not elsewhere classified: Secondary | ICD-10-CM

## 2018-04-03 ENCOUNTER — Ambulatory Visit (HOSPITAL_COMMUNITY)
Admission: RE | Admit: 2018-04-03 | Discharge: 2018-04-03 | Disposition: A | Discharge status: Home / Self Care | Payer: PPO | Source: Ambulatory Visit | Attending: Interventional Radiology | Admitting: Interventional Radiology

## 2018-04-03 ENCOUNTER — Ambulatory Visit
Admission: RE | Admit: 2018-04-03 | Discharge: 2018-04-03 | Disposition: A | Discharge status: Home / Self Care | Payer: PPO | Source: Ambulatory Visit | Attending: Interventional Radiology | Admitting: Interventional Radiology

## 2018-04-03 DIAGNOSIS — K769 Liver disease, unspecified: Secondary | ICD-10-CM | POA: Insufficient documentation

## 2018-04-03 DIAGNOSIS — K862 Cyst of pancreas: Secondary | ICD-10-CM | POA: Diagnosis not present

## 2018-04-03 DIAGNOSIS — R160 Hepatomegaly, not elsewhere classified: Secondary | ICD-10-CM | POA: Diagnosis not present

## 2018-04-03 DIAGNOSIS — C221 Intrahepatic bile duct carcinoma: Secondary | ICD-10-CM | POA: Diagnosis not present

## 2018-04-03 DIAGNOSIS — R590 Localized enlarged lymph nodes: Secondary | ICD-10-CM | POA: Diagnosis not present

## 2018-04-03 DIAGNOSIS — C220 Liver cell carcinoma: Secondary | ICD-10-CM | POA: Diagnosis not present

## 2018-04-03 HISTORY — PX: IR RADIOLOGIST EVAL & MGMT: IMG5224

## 2018-04-03 LAB — POCT I-STAT CREATININE: Creatinine, Ser: 0.6 mg/dL (ref 0.44–1.00)

## 2018-04-03 MED ORDER — GADOBENATE DIMEGLUMINE 529 MG/ML IV SOLN
15.0000 mL | Freq: Once | INTRAVENOUS | Status: AC | PRN
  Administered 2018-04-03: 13 mL via INTRAVENOUS

## 2018-04-03 NOTE — Progress Notes (Signed)
Patient ID: Laura [name], female   DOB: 04/18/1941, 77 y.o.   MRN: [id number]       Chief Complaint:   Hepatic cholangiocarcinoma, outpatient follow-up  Referring Physician(s): Darius Lundberg  History of Present Illness: Laura [name] is a 77 y.o. female with biopsy-proven right hepatic cholangiocarcinoma.  She has undergone hepatic microwave ablation followed by right hepatic Y 90 radial embolization.  Last treatment July 2018.  She returns for continued surveillance imaging every 3 months with MRI.  She remains a symptomatic. no significant abdominal pain or flank pain.  No nausea, vomiting or change in bowel habits.  No signs of jaundice or liver failure.  Stable weight and appetite.  Stable energy level and functional status.  She remains at her baseline.  No new complaints.  Past Medical History:  Diagnosis Date  . Cancer ([place])   . GERD (gastroesophageal reflux disease)   . Heart murmur   . High cholesterol   . Hypertension   . IBS (irritable bowel syndrome)   . PONV (postoperative nausea and vomiting)   . Pre-diabetes     Past Surgical History:  Procedure Laterality Date  . BREAST EXCISIONAL BIOPSY Right 1960   scar is not visible   . BREAST SURGERY     Benign  . CHOLECYSTECTOMY    . IR ANGIOGRAM SELECTIVE EACH ADDITIONAL VESSEL  07/05/2017  . IR ANGIOGRAM SELECTIVE EACH ADDITIONAL VESSEL  07/05/2017  . IR ANGIOGRAM SELECTIVE EACH ADDITIONAL VESSEL  07/05/2017  . IR ANGIOGRAM SELECTIVE EACH ADDITIONAL VESSEL  07/19/2017  . IR ANGIOGRAM VISCERAL SELECTIVE  07/05/2017  . IR ANGIOGRAM VISCERAL SELECTIVE  07/05/2017  . IR ANGIOGRAM VISCERAL SELECTIVE  07/19/2017  . IR EMBO ARTERIAL NOT HEMORR HEMANG INC GUIDE ROADMAPPING  07/05/2017  . IR EMBO TUMOR ORGAN ISCHEMIA INFARCT INC GUIDE ROADMAPPING  07/19/2017  . IR GENERIC HISTORICAL  09/27/2016   IR RADIOLOGIST EVAL & MGMT 09/27/2016 Greggory Keen, MD [facility] INTERV RAD  . IR GENERIC HISTORICAL  12/07/2016   IR RADIOLOGIST EVAL &  MGMT 12/07/2016 Greggory Keen, MD [facility] INTERV RAD  . IR RADIOLOGIST EVAL & MGMT  03/08/2017  . IR RADIOLOGIST EVAL & MGMT  08/16/2017  . IR RADIOLOGIST EVAL & MGMT  10/09/2017  . IR RADIOLOGIST EVAL & MGMT  06/14/2017  . IR RADIOLOGIST EVAL & MGMT  01/08/2018  . IR US GUIDE VASC ACCESS RIGHT  07/05/2017  . IR US GUIDE VASC ACCESS RIGHT  07/19/2017  . TOTAL ABDOMINAL HYSTERECTOMY      Allergies: Pneumococcal vaccines and Codeine  Medications: Prior to Admission medications   Medication Sig Start Date End Date Taking? Authorizing Provider  amLODipine (NORVASC) 5 MG tablet Take 5 mg by mouth daily.  06/22/14  Yes [provider]  aspirin EC 81 MG tablet Take 81 mg by mouth daily.   Yes [provider]  Biotin 5000 MCG TABS Take 5,000 mcg by mouth every other day.   Yes [provider]  Cholecalciferol (VITAMIN D3) 5000 units CAPS Take 5,000 Units by mouth daily.   Yes [provider]  clonazePAM (KLONOPIN) 0.5 MG tablet Take 0.25-0.5 mg by mouth. Take 0.25mg s twice daily and 0.5mg s at bedtime, may take an additional 0.25mg s as needed for anxiety   Yes [provider]  docusate sodium (COLACE) 50 MG capsule Take 50 mg by mouth 3 (three) times daily as needed for mild constipation.   Yes [provider]  doxazosin (CARDURA) 1 MG tablet Take 1 mg  by mouth at bedtime.  06/22/14  Yes [provider]  omeprazole (PRILOSEC) 20 MG capsule Take 20 mg by mouth 2 (two) times daily. 06/25/17  Yes [provider]  Jonetta Speak LANCETS [address] [place]  12/09/16  Yes [provider]  pravastatin (PRAVACHOL) 40 MG tablet Take 40 mg by mouth every evening.    Yes [provider]  prochlorperazine (COMPAZINE) 5 MG tablet Take 1 tablet (5 mg total) by mouth every 6 (six) hours as needed for nausea or vomiting. 08/03/17  Yes Ladell Pier, MD  tetrahydrozoline 0.05 % ophthalmic solution Place 1 drop into both eyes daily as needed  (dry eye).    Yes [provider]     Family History  Problem Relation Age of Onset  . Alzheimer's disease Mother   . Alzheimer's disease Brother   . Alzheimer's disease Sister   . Breast cancer Sister   . CAD Brother 75       CABG  . Cancer Sister        Breast  . Cancer Brother        Lung    Social History   Socioeconomic History  . Marital status: Widowed    Spouse name: Lanae Boast  . Number of children: 3  . Years of education: Not on file  . Highest education level: Not on file  Occupational History  . Occupation: Retired  Scientific laboratory technician  . Financial resource strain: Not on file  . Food insecurity:    Worry: Not on file    Inability: Not on file  . Transportation needs:    Medical: Not on file    Non-medical: Not on file  Tobacco Use  . Smoking status: Never Smoker  . Smokeless tobacco: Never Used  Substance and Sexual Activity  . Alcohol use: No  . Drug use: No  . Sexual activity: Not on file  Lifestyle  . Physical activity:    Days per week: Not on file    Minutes per session: Not on file  . Stress: Not on file  Relationships  . Social connections:    Talks on phone: Not on file    Gets together: Not on file    Attends religious service: Not on file    Active member of club or organization: Not on file    Attends meetings of clubs or organizations: Not on file    Relationship status: Not on file  Other Topics Concern  . Not on file  Social History Narrative   Lives at home with husband, Lanae Boast who is in poor health (she is caregiver)   Has #3 daughters   Retired from working at [employer]   Has total of #7 siblings    ECOG Status: 2 - Symptomatic, <50% confined to bed  Review of Systems: A 12 point ROS discussed and pertinent positives are indicated in the HPI above.  All other systems are negative.  Review of Systems  Vital Signs: BP (!) 168/57 (BP Location: Right Arm, Patient Position: Sitting, Cuff Size: Normal)   Temp  97.7 F (36.5 C)   Resp 14   SpO2 98%   Physical Exam  Constitutional: She is oriented to person, place, and time. She appears well-developed and well-nourished. No distress.  Eyes: Conjunctivae are normal. No scleral icterus.  Cardiovascular: Normal rate and regular rhythm.  Pulmonary/Chest: Effort normal and breath sounds normal.  Abdominal: Soft. Bowel sounds are normal. She exhibits no distension. There is no  tenderness.  Musculoskeletal: She exhibits no edema.  Neurological: She is alert and oriented to person, place, and time.  Skin: She is not diaphoretic.  Psychiatric: She has a normal mood and affect. Her behavior is normal.      Imaging: Mr Abdomen Wwo Contrast  Result Date: 04/03/2018 CLINICAL DATA:  Follow-up right hepatic lobe cholangiocarcinoma. Status post microwave ablation and Y-90. EXAM: MRI ABDOMEN WITHOUT AND WITH CONTRAST TECHNIQUE: Multiplanar multisequence MR imaging of the abdomen was performed both before and after the administration of intravenous contrast. CONTRAST:  5mL MULTIHANCE GADOBENATE DIMEGLUMINE 529 MG/ML IV SOLN COMPARISON:  01/08/2018 FINDINGS: Lower chest: No acute findings. Hepatobiliary: Lesion in segment 6 of the right hepatic lobe again demonstrates areas of heterogeneous nodular contrast enhancement. This measures 3.5 x 3.0 cm on image 56/1003, without significant change compared to prior study. A 10 mm flash-filling lesion in segment 3 on image 53/1001 is stable. This is isointense to liver on all other phases and diffusion imaging, and likely represents a benign etiology such as a vascular shunt or focal nodular hyperplasia. No new or enlarging liver lesions are identified. Prior cholecystectomy noted. No evidence of biliary ductal dilatation. Pancreas: A cystic lesion with multiple thin internal septations is seen in the pancreatic uncinate process which measures 2.3 x 2.3 cm on image 13/8. This has been stable compared to previous studies dating  back to 03/08/2017. No other pancreatic lesions seen. No evidence of pancreatic ductal dilatation or pancreas divisum. Spleen:  Within normal limits in size and appearance. Adrenals/Urinary Tract: No masses identified. Small left renal parenchymal and sinus cysts. No evidence of hydronephrosis. Stomach/Bowel: Visualized portion unremarkable. Vascular/Lymphatic: Lymphadenopathy is seen within the porta hepatis which shows central necrosis. This measures 2.2 cm on image 44/1002 compared to 1.5 cm previously. No new sites of lymphadenopathy identified. Other:  None. Musculoskeletal:  No suspicious bone lesions identified. IMPRESSION: No significant change in 3.5 cm heterogeneously enhancing lesion in segment 6 of the right hepatic lobe, consistent with residual tumor. Stable 10 mm flash-filling lesion in segment 3 of the left lobe, suspicious for benign etiology such as vascular shunt or focal nodular hyperplasia. Recommend continued attention on follow-up imaging. Mildly increased porta hepatis lymphadenopathy, consistent with metastatic disease. Stable 2.3 cm complex cystic lesion in pancreatic uncinate process, consistent with indolent cystic pancreatic neoplasm such as a side-branch IPMN. Recommend continued attention on follow-up imaging. Electronically Signed   By: Earle Gell M.D.   On: 04/03/2018 09:23    Labs:  CBC: Recent Labs    07/05/17 0816 07/19/17 0905 08/03/17 0811  WBC 2.9* 3.8* 3.9  HGB 11.9* 11.9* 12.2  HCT 35.0* 35.5* 36.2  PLT 200 202 260    COAGS: Recent Labs    07/05/17 0816  INR 0.95  APTT 27    BMP: Recent Labs    07/05/17 0816 07/19/17 0905 10/09/17 0811 01/08/18 1307 04/03/18 0831  NA 140 139  --   --   --   K 3.9 4.1  --   --   --   CL 105 104  --   --   --   CO2 29 28  --   --   --   GLUCOSE 118* 113*  --   --   --   BUN 13 17  --   --   --   CALCIUM 9.6 9.7  --   --   --   CREATININE 0.53 0.54 0.50 0.70 0.60  GFRNONAA >60 >60  --   --   --  GFRAA  >60 >60  --   --   --     LIVER FUNCTION TESTS: Recent Labs    07/05/17 0816 07/19/17 0905  BILITOT 0.6 0.4  AST 22 25  ALT 16 15  ALKPHOS 103 111  PROT 7.3 7.7  ALBUMIN 4.2 4.3    TUMOR MARKERS: No results for input(s): AFPTM, CEA, CA199, CHROMGRNA in the last 8760 hours.  Assessment and Plan:  68-month status post right hepatic Y 90 radio embolization for hepatic cholangiocarcinoma.  MRI surveillance imaging every 3 months today demonstrates stable residual disease/posttreatment changes in the right hepatic lobe without evidence of hepatic progression.  However, there is interval enlargement of a porta hepatis lymph node now measuring up to 2.3 cm.  No other significant extrahepatic disease.  No ascites.  Imaging findings were reviewed with the patient and her family member today on the monitor.  All questions were addressed.  Plan: She is scheduled to see Dr. Benay Spice in oncology on 04/16/2018.  Because of early extrahepatic nodal disease progression by imaging consider PET/CT imaging and any chemotherapy options.  Hold on any additional Y 90 as there is no current definitive evidence of hepatic progression by MR.  Continue MR surveillance every 3 months.   Electronically Signed: Greggory Keen 04/03/2018, 11:27 AM   I spent a total of  40 Minutes   in face to face in clinical consultation, greater than 50% of which was counseling/coordinating care for this patient with hepatic Cholangiocarcinoma.

## 2018-04-16 ENCOUNTER — Inpatient Hospital Stay: Payer: PPO | Attending: Oncology | Admitting: Oncology

## 2018-04-16 ENCOUNTER — Telehealth: Payer: Self-pay | Admitting: Oncology

## 2018-04-16 VITALS — BP 146/57 | HR 82 | Temp 97.8°F | Resp 17 | Ht 65.0 in | Wt 145.1 lb

## 2018-04-16 DIAGNOSIS — C787 Secondary malignant neoplasm of liver and intrahepatic bile duct: Secondary | ICD-10-CM | POA: Diagnosis not present

## 2018-04-16 DIAGNOSIS — C801 Malignant (primary) neoplasm, unspecified: Secondary | ICD-10-CM

## 2018-04-16 DIAGNOSIS — K862 Cyst of pancreas: Secondary | ICD-10-CM | POA: Diagnosis not present

## 2018-04-16 NOTE — Telephone Encounter (Signed)
Appointments scheduled AVS/Calendar printed per 4/23 los °

## 2018-04-16 NOTE — Progress Notes (Signed)
[facility] OFFICE PROGRESS NOTE   Diagnosis: Adenocarcinoma  INTERVAL HISTORY:   [name] returns as scheduled.  A surveillance MRI 04/03/2018 revealed no change in the segment 6 liver lesion.  A stable 10 mm flash filling lesion was noted in segment 3.  Mild increase in porta hepatis lymphadenopathy.  Stable cystic pancreas lesion.  She feels well.  Good appetite.  No dyspnea.  No complaint.  Objective:  Vital signs in last 24 hours:  Blood pressure (!) 146/57, pulse 82, temperature 97.8 F (36.6 C), temperature source Oral, resp. rate 17, height 5\' 5"  (1.651 m), weight 145 lb 1.6 oz (65.8 kg), SpO2 98 %.    HEENT: Neck without mass Lymphatics: No cervical, supraclavicular, axillary, or inguinal nodes Resp: Lungs clear bilaterally Cardio: Regular rate and rhythm GI: No hepatosplenomegaly, no mass Vascular: No leg edema   Lab Results:  Lab Results  Component Value Date   WBC 3.9 08/03/2017   HGB 12.2 08/03/2017   HCT 36.2 08/03/2017   MCV 88.3 08/03/2017   PLT 260 08/03/2017   NEUTROABS 2.8 08/03/2017    CMP     Component Value Date/Time   NA 139 07/19/2017 0905   K 4.1 07/19/2017 0905   CL 104 07/19/2017 0905   CO2 28 07/19/2017 0905   GLUCOSE 113 (H) 07/19/2017 0905   BUN 17 07/19/2017 0905   CREATININE 0.60 04/03/2018 0831   CREATININE 0.72 07/27/2014 1218   CALCIUM 9.7 07/19/2017 0905   PROT 7.7 07/19/2017 0905   ALBUMIN 4.3 07/19/2017 0905   AST 25 07/19/2017 0905   ALT 15 07/19/2017 0905   ALKPHOS 111 07/19/2017 0905   BILITOT 0.4 07/19/2017 0905   GFRNONAA >60 07/19/2017 0905   GFRAA >60 07/19/2017 0905    No results found for: CEA1  Lab Results  Component Value Date   INR 0.95 07/05/2017    Imaging:  No results found.  Medications: I have reviewed the patient's current medications.   Assessment/Plan: 1. Right liver mass-biopsy 07/10/2016 confirmed adenocarcinoma  CT abdomen/pelvis 06/17/2016 and MRI abdomen  06/21/2016 confirmed an isolated right liver mass and a cystic pancreas lesion  PET scan 07/28/2016-the hepatic lesion is not hypermetabolic, hypermetabolic lesion in the gastric antrum suspicious for a primary neoplasm and no other evidence of metastatic disease  Upper endoscopy 08/03/2016-no mass found, biopsy from the gastric antrum-benign  CT-guided ablation of the right liver lesion 11/03/2016  MRI abdomen 03/08/2017-ablation defect in the right liver without residual/recurrent disease  MRI liver 06/11/2017-multiple new enhancing lesions in the right hepatic lobe surrounding the ablation defect consistent with progressive cholangiocarcinoma  Y-90 radioembolization of the right liver 07/19/2017  MRI abdomen 10/09/2017-suspected progression of intrahepatic larger carcinoma within segment 6, reviewed by interventional radiology most consistent with pseudo-progression  MRI abdomen 01/08/2018- slight decrease in the segment 6 mass, no change in enhancement at the ablation bed, new tiny arterial phase area of hyperenhancement in the lateral left liver-potentially a vascular phenomena  MRI abdomen 04/03/2018- stable segment 6 mass, no change in 10 mm flash filling segment 3 lesion-potentially a vascular shunt, a porta hepatis node measures 2.2 cm compared to 1.5 cm, no new lymphadenopathy 2. Left leg and right foot pain  Negative left leg Doppler 07/14/2016  3. Right abdominal pain-resolved  4. Anorexia/weight loss-resolved  5. Benign appearing 16 mm cystic uncinatepancreas lesion noted on MRI 06/20/2016   Slightly larger on an MRI 03/08/2017, felt to most likely represent a side branch intraductal papillary mucinous  neoplasm  Stable on MRI 10/09/2017  Stable on MRI 01/08/2018  Stable on MRI 04/03/2018   Disposition: Ms. Toft appears unchanged.  She is asymptomatic from the adenocarcinoma, most likely cholangiocarcinoma.  The restaging MRI revealed stable disease in  the liver with an enlarging periportal node.  It is unlikely that any therapy will be curative and there is no evidence that systemic treatment now versus later will impact on her survival.  I reviewed treatment options Ms. Amico and her daughters.  She is comfortable with observation.  I do not recommend additional imaging studies at present. She will return for an office visit in 3-4 months.  We will consider obtaining a restaging CT evaluation at a six-month interval.  15 minutes were spent with the patient today.  The majority of the time was used for counseling and coordination of care.  [name], MD  04/16/2018  12:33 PM

## 2018-05-31 ENCOUNTER — Ambulatory Visit (INDEPENDENT_AMBULATORY_CARE_PROVIDER_SITE_OTHER): Payer: PPO | Admitting: Podiatry

## 2018-05-31 ENCOUNTER — Encounter: Payer: Self-pay | Admitting: Podiatry

## 2018-05-31 DIAGNOSIS — L84 Corns and callosities: Secondary | ICD-10-CM

## 2018-05-31 DIAGNOSIS — M79675 Pain in left toe(s): Secondary | ICD-10-CM

## 2018-05-31 DIAGNOSIS — M79674 Pain in right toe(s): Secondary | ICD-10-CM | POA: Diagnosis not present

## 2018-05-31 DIAGNOSIS — B351 Tinea unguium: Secondary | ICD-10-CM | POA: Diagnosis not present

## 2018-05-31 NOTE — Progress Notes (Signed)
Subjective: 77 y.o. returns the office today for painful, elongated, thickened toenails which she cannot trim herself. Denies any redness or drainage around the nails.  She also has a callus on the left foot which was not trimmed possible.  She said the area is very thick and painful.  Denies any acute changes since last appointment and no new complaints today. Denies any systemic complaints such as fevers, chills, nausea, vomiting.   PCP: Haywood Pao, MD  Objective: AAO 3, NAD DP/PT pulses palpable, CRT less than 3 seconds Nails hypertrophic, dystrophic, elongated, brittle, discolored 10. There is tenderness overlying the nails 1-5 bilaterally. There is no surrounding erythema or drainage along the nail sites. Hyperkeratotic lesion left foot submetatarsal 2.  Upon debridement there is no underlying ulceration, drainage or any signs of infection.  There is minimal bleeding occurred during this and this appeared to be along the deeper area but there is no crepitus skin. No open lesions or pre-ulcerative lesions are identified. No other areas of tenderness bilateral lower extremities. No overlying edema, erythema, increased warmth. No pain with calf compression, swelling, warmth, erythema.  Assessment: Patient presents with symptomatic onychomycosis; hyperkeratotic lesion  Plan: -Treatment options including alternatives, risks, complications were discussed -Nails sharply debrided 10 without complication/bleeding. -Hyperkeratotic lesion was sharply debrided.  Very minimal bleeding occurred and severe to be superficial scratch that there is no cut to the skin edges on the deeper area the callus.  The area was cleaned with alcohol and antibiotic ointment and a bandage was applied.  Monitoring signs or symptoms of infection or if there is any issues to let me know. -Discussed daily foot inspection. If there are any changes, to call the office immediately.  -Follow-up in 3 months or sooner  if any problems are to arise. In the meantime, encouraged to call the office with any questions, concerns, changes symptoms.  Celesta Gentile, DPM

## 2018-06-19 ENCOUNTER — Other Ambulatory Visit: Payer: Self-pay | Admitting: Radiology

## 2018-06-19 ENCOUNTER — Other Ambulatory Visit (HOSPITAL_COMMUNITY): Payer: Self-pay | Admitting: Interventional Radiology

## 2018-06-19 DIAGNOSIS — C221 Intrahepatic bile duct carcinoma: Secondary | ICD-10-CM

## 2018-06-28 ENCOUNTER — Other Ambulatory Visit: Payer: Self-pay | Admitting: Internal Medicine

## 2018-06-28 DIAGNOSIS — Z1231 Encounter for screening mammogram for malignant neoplasm of breast: Secondary | ICD-10-CM

## 2018-07-03 ENCOUNTER — Encounter: Payer: Self-pay | Admitting: Radiology

## 2018-07-03 ENCOUNTER — Ambulatory Visit
Admission: RE | Admit: 2018-07-03 | Discharge: 2018-07-03 | Disposition: A | Discharge status: Home / Self Care | Payer: PPO | Source: Ambulatory Visit | Attending: Interventional Radiology | Admitting: Interventional Radiology

## 2018-07-03 ENCOUNTER — Ambulatory Visit (HOSPITAL_COMMUNITY)
Admission: RE | Admit: 2018-07-03 | Discharge: 2018-07-03 | Disposition: A | Discharge status: Home / Self Care | Payer: PPO | Source: Ambulatory Visit | Attending: Interventional Radiology | Admitting: Interventional Radiology

## 2018-07-03 DIAGNOSIS — Z9889 Other specified postprocedural states: Secondary | ICD-10-CM | POA: Diagnosis not present

## 2018-07-03 DIAGNOSIS — C221 Intrahepatic bile duct carcinoma: Secondary | ICD-10-CM | POA: Diagnosis not present

## 2018-07-03 HISTORY — PX: IR RADIOLOGIST EVAL & MGMT: IMG5224

## 2018-07-03 LAB — POCT I-STAT CREATININE: Creatinine, Ser: 0.6 mg/dL (ref 0.44–1.00)

## 2018-07-03 MED ORDER — GADOBENATE DIMEGLUMINE 529 MG/ML IV SOLN
15.0000 mL | Freq: Once | INTRAVENOUS | Status: AC | PRN
  Administered 2018-07-03: 13 mL via INTRAVENOUS

## 2018-07-03 NOTE — Progress Notes (Signed)
Patient ID: Laura [name], female   DOB: 24-Mar-1941, 77 y.o.   MRN: [id number]       Chief Complaint:  1-month status post right hepatic Y 90 radioembolization for plantar carcinoma  Referring Physician(s): Merrillyn Ackerley  History of Present Illness: Laura [name] is a 77 y.o. 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  She is now 2-month status post right hepatic Y 90 radioembolization performed 07/19/2017.  She has been undergoing MRI surveillance every 3 months.  She returns today to review outpatient imaging.  Overall she remains asymptomatic.  No physical limitations.  Stable functional status.  She remains very active.  No signs of liver failure or jaundice.  No significant abdominal pain or flank pain.  Stable weight and appetite.  Past Medical History:  Diagnosis Date  . Cancer ([place])   . GERD (gastroesophageal reflux disease)   . Heart murmur   . High cholesterol   . Hypertension   . IBS (irritable bowel syndrome)   . PONV (postoperative nausea and vomiting)   . Pre-diabetes     Past Surgical History:  Procedure Laterality Date  . BREAST EXCISIONAL BIOPSY Right 1960   scar is not visible   . BREAST SURGERY     Benign  . CHOLECYSTECTOMY    . IR ANGIOGRAM SELECTIVE EACH ADDITIONAL VESSEL  07/05/2017  . IR ANGIOGRAM SELECTIVE EACH ADDITIONAL VESSEL  07/05/2017  . IR ANGIOGRAM SELECTIVE EACH ADDITIONAL VESSEL  07/05/2017  . IR ANGIOGRAM SELECTIVE EACH ADDITIONAL VESSEL  07/19/2017  . IR ANGIOGRAM VISCERAL SELECTIVE  07/05/2017  . IR ANGIOGRAM VISCERAL SELECTIVE  07/05/2017  . IR ANGIOGRAM VISCERAL SELECTIVE  07/19/2017  . IR EMBO ARTERIAL NOT HEMORR HEMANG INC GUIDE ROADMAPPING  07/05/2017  . IR EMBO TUMOR ORGAN ISCHEMIA INFARCT INC GUIDE ROADMAPPING  07/19/2017  . IR GENERIC HISTORICAL  09/27/2016   IR RADIOLOGIST EVAL & MGMT 09/27/2016 Greggory Keen, MD [facility] INTERV RAD  . IR GENERIC HISTORICAL  12/07/2016   IR RADIOLOGIST EVAL & MGMT  12/07/2016 Greggory Keen, MD [facility] INTERV RAD  . IR RADIOLOGIST EVAL & MGMT  03/08/2017  . IR RADIOLOGIST EVAL & MGMT  08/16/2017  . IR RADIOLOGIST EVAL & MGMT  10/09/2017  . IR RADIOLOGIST EVAL & MGMT  06/14/2017  . IR RADIOLOGIST EVAL & MGMT  01/08/2018  . IR RADIOLOGIST EVAL & MGMT  04/03/2018  . IR US GUIDE VASC ACCESS RIGHT  07/05/2017  . IR US GUIDE VASC ACCESS RIGHT  07/19/2017  . TOTAL ABDOMINAL HYSTERECTOMY      Allergies: Pneumococcal vaccines and Codeine  Medications: Prior to Admission medications   Medication Sig Start Date End Date Taking? Authorizing Provider  amLODipine (NORVASC) 5 MG tablet Take 5 mg by mouth daily.  06/22/14  Yes [provider]  aspirin EC 81 MG tablet Take 81 mg by mouth daily.   Yes [provider]  Biotin 5000 MCG TABS Take 5,000 mcg by mouth every other day.   Yes [provider]  Cholecalciferol (VITAMIN D3) 5000 units CAPS Take 5,000 Units by mouth daily.   Yes [provider]  clonazePAM (KLONOPIN) 0.5 MG tablet Take 0.25-0.5 mg by mouth. Take 0.25mg s twice daily and 0.5mg s at bedtime, may take an additional 0.25mg s as needed for anxiety   Yes [provider]  docusate sodium (COLACE) 50 MG capsule Take 50 mg by mouth 3 (three) times daily as needed for mild constipation.   Yes [provider]  doxazosin (CARDURA)  1 MG tablet Take 1 mg by mouth at bedtime.  06/22/14  Yes [provider]  Multiple Vitamins-Minerals (CENTRUM SILVER ADULT 50+ PO) Take 1 tablet by mouth.   Yes [provider]  omeprazole (PRILOSEC) 20 MG capsule Take 20 mg by mouth 2 (two) times daily. 06/25/17  Yes [provider]  Jonetta Speak LANCETS 61Y [place]  12/09/16  Yes [provider]  pravastatin (PRAVACHOL) 40 MG tablet Take 40 mg by mouth every evening.    Yes [provider]  prochlorperazine (COMPAZINE) 5 MG tablet Take 1 tablet (5 mg total) by mouth every 6 (six) hours as needed  for nausea or vomiting. 08/03/17  Yes Ladell Pier, MD  tetrahydrozoline 0.05 % ophthalmic solution Place 1 drop into both eyes daily as needed (dry eye).    Yes [provider]  vitamin B-12 (CYANOCOBALAMIN) 1000 MCG tablet Take 1,000 mcg by mouth daily.   Yes [provider]     Family History  Problem Relation Age of Onset  . Alzheimer's disease Mother   . Alzheimer's disease Brother   . Alzheimer's disease Sister   . Breast cancer Sister   . CAD Brother 59       CABG  . Cancer Sister        Breast  . Cancer Brother        Lung    Social History   Socioeconomic History  . Marital status: Widowed    Spouse name: Lanae Boast  . Number of children: 3  . Years of education: Not on file  . Highest education level: Not on file  Occupational History  . Occupation: Retired  Scientific laboratory technician  . Financial resource strain: Not on file  . Food insecurity:    Worry: Not on file    Inability: Not on file  . Transportation needs:    Medical: Not on file    Non-medical: Not on file  Tobacco Use  . Smoking status: Never Smoker  . Smokeless tobacco: Never Used  Substance and Sexual Activity  . Alcohol use: No  . Drug use: No  . Sexual activity: Not on file  Lifestyle  . Physical activity:    Days per week: Not on file    Minutes per session: Not on file  . Stress: Not on file  Relationships  . Social connections:    Talks on phone: Not on file    Gets together: Not on file    Attends religious service: Not on file    Active member of club or organization: Not on file    Attends meetings of clubs or organizations: Not on file    Relationship status: Not on file  Other Topics Concern  . Not on file  Social History Narrative   Lives at home with husband, Lanae Boast who is in poor health (she is caregiver)   Has #3 daughters   Retired from working at [employer]   Has total of #7 siblings    ECOG Status: 1 - Symptomatic but completely  ambulatory  Review of Systems: A 12 point ROS discussed and pertinent positives are indicated in the HPI above.  All other systems are negative.  Review of Systems  Vital Signs: Ht 5\' 5"  (1.651 m)   Wt 142 lb (64.4 kg)   BMI 23.63 kg/m   Physical Exam  Constitutional: She is oriented to person, place, and time. She appears well-developed and well-nourished. No distress.  Eyes: Conjunctivae are normal.  No scleral icterus.  Cardiovascular: Normal rate and regular rhythm.  Pulmonary/Chest: Effort normal and breath sounds normal.  Abdominal: Soft. Bowel sounds are normal.  Neurological: She is alert and oriented to person, place, and time.  Skin: She is not diaphoretic.      Imaging: Mr Abdomen Wwo Contrast  Result Date: 07/03/2018 CLINICAL DATA:  Right hepatic lobe cholangiocarcinoma, status post microwave ablation and Y-90. EXAM: MRI ABDOMEN WITHOUT AND WITH CONTRAST TECHNIQUE: Multiplanar multisequence MR imaging of the abdomen was performed both before and after the administration of intravenous contrast. CONTRAST:  54mL MULTIHANCE GADOBENATE DIMEGLUMINE 529 MG/ML IV SOLN COMPARISON:  04/03/2018 FINDINGS: Lower chest: Unremarkable Hepatobiliary: 3.5 by 3.0 cm mass posteriorly in segment 6 of the liver is shown on image 56/904, with some internal necrosis as well as internal nodularity and septation. There is potentially a small amount of tumor tracking cephalad along the posterior capsular margin of the liver for example on image 46/901, not appreciably changed. Mild surrounding transient hepatic attenuation difference. 1.0 by 0.6 cm lesion anteriorly in segment 3 of the liver on image 57/901 demonstrates arterial phase enhancement and later phase isointensity compared to the rest of the liver. Gallbladder absent.  No new hepatic masses are identified. Pancreas: Cystic lesion with faint internal septation along the anterior inferior margin of the pancreatic head measuring 2.1 by 1.3 cm,  formerly the same by my measurements. Faint internal septation without measurable enhancement. Spleen:  Unremarkable Adrenals/Urinary Tract: Bilateral parapelvic cysts. The adrenal glands appear normal. 7 mm cyst in the left kidney. Stomach/Bowel: Unremarkable Vascular/Lymphatic: Porta hepatis node 2.3 cm in short axis on image 47/1, stable size compared to 04/03/2018. Other:  No supplemental non-categorized findings. Musculoskeletal: Degenerative endplate findings eccentric to the right at L4-5. IMPRESSION: 1. Stable 3.5 by 3.0 cm mass posteriorly in segment 6 of the liver, compatible with residual malignancy. This has internal nodularity and septation but no significant interval growth. Likewise, the associated porta hepatis adenopathy, measuring up to 2.3 cm in short axis, is stable. 2. Stable 1.0 by 0.6 cm arterial phase enhancing lesion anteriorly in segment 3 of the liver, probably focal nodular hyperplasia, a stable. 3. Stable 2.1 by 1.3 cm cystic lesion along the anterior inferior margin of the pancreatic head, quite likely a small branch type intraductal papillary mucinous neoplasm. Surveillance likely warranted. Electronically Signed   By: Van Clines M.D.   On: 07/03/2018 08:10    Labs:  CBC: Recent Labs    07/05/17 0816 07/19/17 0905 08/03/17 0811  WBC 2.9* 3.8* 3.9  HGB 11.9* 11.9* 12.2  HCT 35.0* 35.5* 36.2  PLT 200 202 260    COAGS: Recent Labs    07/05/17 0816  INR 0.95  APTT 27    BMP: Recent Labs    07/05/17 0816 07/19/17 0905 10/09/17 0811 01/08/18 1307 04/03/18 0831 07/03/18 0700  NA 140 139  --   --   --   --   K 3.9 4.1  --   --   --   --   CL 105 104  --   --   --   --   CO2 29 28  --   --   --   --   GLUCOSE 118* 113*  --   --   --   --   BUN 13 17  --   --   --   --   CALCIUM 9.6 9.7  --   --   --   --  CREATININE 0.53 0.54 0.50 0.70 0.60 0.60  GFRNONAA >60 >60  --   --   --   --   GFRAA >60 >60  --   --   --   --     LIVER FUNCTION  TESTS: Recent Labs    07/05/17 0816 07/19/17 0905  BILITOT 0.6 0.4  AST 22 25  ALT 16 15  ALKPHOS 103 111  PROT 7.3 7.7  ALBUMIN 4.2 4.3    TUMOR MARKERS: No results for input(s): AFPTM, CEA, CA199, CHROMGRNA in the last 8760 hours.  Assessment and Plan:  Surveillance MRI today demonstrates stable posttreatment changes/residual tumor in the right hepatic lobe inferiorly.  Stable periportal adenopathy.  No new hepatic disease or definite evidence of interval progression.  Imaging reviewed with the patient and her daughter today.  Plan: Extend MRI surveillance to 21-month intervals.  If there is any evidence of hepatic disease progression she would be a candidate for repeat right hepatic Y 90 radial embolization.    Electronically Signed: Greggory Keen 07/03/2018, 10:25 AM   I spent a total of    25 Minutes in face to face in clinical consultation, greater than 50% of which was counseling/coordinating care for this patient with hepatic adenocarcinoma, presumed to be cholangiocarcinoma.

## 2018-07-09 DIAGNOSIS — H6692 Otitis media, unspecified, left ear: Secondary | ICD-10-CM | POA: Diagnosis not present

## 2018-07-09 DIAGNOSIS — Z6823 Body mass index (BMI) 23.0-23.9, adult: Secondary | ICD-10-CM | POA: Diagnosis not present

## 2018-07-09 DIAGNOSIS — I5189 Other ill-defined heart diseases: Secondary | ICD-10-CM | POA: Diagnosis not present

## 2018-07-12 DIAGNOSIS — I10 Essential (primary) hypertension: Secondary | ICD-10-CM | POA: Diagnosis not present

## 2018-07-12 DIAGNOSIS — Z6823 Body mass index (BMI) 23.0-23.9, adult: Secondary | ICD-10-CM | POA: Diagnosis not present

## 2018-07-12 DIAGNOSIS — M25559 Pain in unspecified hip: Secondary | ICD-10-CM | POA: Diagnosis not present

## 2018-07-19 ENCOUNTER — Other Ambulatory Visit: Payer: Self-pay | Admitting: *Deleted

## 2018-07-19 MED ORDER — CLONAZEPAM 0.5 MG PO TABS: 0.2500 mg | ORAL_TABLET | Freq: Two times a day (BID) | ORAL | 0 refills | Status: DC | PRN

## 2018-07-19 NOTE — Telephone Encounter (Signed)
Patient called for refill on Klonopin stating her PCP will not refill for her. Ok per Dr. Benay Spice and called into [facility] on [address] as patient instructed.

## 2018-07-24 DIAGNOSIS — N939 Abnormal uterine and vaginal bleeding, unspecified: Secondary | ICD-10-CM | POA: Diagnosis not present

## 2018-07-24 DIAGNOSIS — K5909 Other constipation: Secondary | ICD-10-CM | POA: Diagnosis not present

## 2018-07-24 DIAGNOSIS — N898 Other specified noninflammatory disorders of vagina: Secondary | ICD-10-CM | POA: Diagnosis not present

## 2018-07-24 DIAGNOSIS — N390 Urinary tract infection, site not specified: Secondary | ICD-10-CM | POA: Diagnosis not present

## 2018-07-24 DIAGNOSIS — R3129 Other microscopic hematuria: Secondary | ICD-10-CM | POA: Diagnosis not present

## 2018-07-29 ENCOUNTER — Ambulatory Visit: Payer: PPO | Admitting: Oncology

## 2018-07-29 ENCOUNTER — Telehealth: Payer: Self-pay | Admitting: Oncology

## 2018-07-29 ENCOUNTER — Inpatient Hospital Stay: Payer: PPO | Attending: Oncology | Admitting: Oncology

## 2018-07-29 VITALS — BP 142/61 | HR 79 | Temp 98.6°F | Resp 18 | Ht 65.0 in | Wt 142.9 lb

## 2018-07-29 DIAGNOSIS — C221 Intrahepatic bile duct carcinoma: Secondary | ICD-10-CM | POA: Diagnosis not present

## 2018-07-29 DIAGNOSIS — R599 Enlarged lymph nodes, unspecified: Secondary | ICD-10-CM | POA: Diagnosis not present

## 2018-07-29 DIAGNOSIS — K862 Cyst of pancreas: Secondary | ICD-10-CM | POA: Insufficient documentation

## 2018-07-29 DIAGNOSIS — K1379 Other lesions of oral mucosa: Secondary | ICD-10-CM

## 2018-07-29 DIAGNOSIS — R600 Localized edema: Secondary | ICD-10-CM

## 2018-07-29 DIAGNOSIS — N939 Abnormal uterine and vaginal bleeding, unspecified: Secondary | ICD-10-CM | POA: Insufficient documentation

## 2018-07-29 NOTE — Telephone Encounter (Signed)
Scheduled appt per 8/5 los- gave patient AVS and calender per los. Central radiology to contact patient with MRI

## 2018-07-29 NOTE — Progress Notes (Signed)
[facility] OFFICE PROGRESS NOTE   Diagnosis: Liver mass  INTERVAL HISTORY:   Ms. Corrigan returns as scheduled.  She feels well.  No abdominal pain.  She had an episode of vaginal bleeding.  She is scheduled to see gynecology.  She continues to have a vaginal discharge. An MRI of the abdomen 07/03/2018 revealed no progression of the liver mass.  She reports feeling as though her mouth is "burned "at times.  Objective:  Vital signs in last 24 hours:  Blood pressure (!) 142/61, pulse 79, temperature 98.6 F (37 C), temperature source Oral, resp. rate 18, height 5\' 5"  (1.651 m), weight 142 lb 14.4 oz (64.8 kg), SpO2 98 %.    HEENT: Oral cavity without thrush or ulcers.  No erythema. Lymphatics: No cervical, supraclavicular, axillary, or inguinal nodes Resp: Lungs clear bilaterally Cardio: Regular rate and rhythm GI: No hepatosplenomegaly, nontender, no mass Vascular: Leg edema   Lab Results:  Lab Results  Component Value Date   WBC 3.9 08/03/2017   HGB 12.2 08/03/2017   HCT 36.2 08/03/2017   MCV 88.3 08/03/2017   PLT 260 08/03/2017   NEUTROABS 2.8 08/03/2017    CMP  Lab Results  Component Value Date   NA 139 07/19/2017   K 4.1 07/19/2017   CL 104 07/19/2017   CO2 28 07/19/2017   GLUCOSE 113 (H) 07/19/2017   BUN 17 07/19/2017   CREATININE 0.60 07/03/2018   CALCIUM 9.7 07/19/2017   PROT 7.7 07/19/2017   ALBUMIN 4.3 07/19/2017   AST 25 07/19/2017   ALT 15 07/19/2017   ALKPHOS 111 07/19/2017   BILITOT 0.4 07/19/2017   GFRNONAA >60 07/19/2017   GFRAA >60 07/19/2017    Medications: I have reviewed the patient's current medications.   Assessment/Plan: 1. Right liver mass-biopsy 07/10/2016 confirmed adenocarcinoma  CT abdomen/pelvis 06/17/2016 and MRI abdomen 06/21/2016 confirmed an isolated right liver mass and a cystic pancreas lesion  PET scan 07/28/2016-the hepatic lesion is not hypermetabolic, hypermetabolic lesion in the gastric antrum  suspicious for a primary neoplasm and no other evidence of metastatic disease  Upper endoscopy 08/03/2016-no mass found, biopsy from the gastric antrum-benign  CT-guided ablation of the right liver lesion 11/03/2016  MRI abdomen 03/08/2017-ablation defect in the right liver without residual/recurrent disease  MRI liver 06/11/2017-multiple new enhancing lesions in the right hepatic lobe surrounding the ablation defect consistent with progressive cholangiocarcinoma  Y-90 radioembolization of the right liver 07/19/2017  MRI abdomen 10/09/2017-suspected progression of intrahepatic larger carcinoma within segment 6, reviewed by interventional radiology most consistent with pseudo-progression  MRI abdomen 01/08/2018- slight decrease in the segment 6 mass, no change in enhancement at the ablation bed, new tiny arterial phase area of hyperenhancement in the lateral left liver-potentially a vascular phenomena  MRI abdomen 04/03/2018- stable segment 6 mass, no change in 10 mm flash filling segment 3 lesion-potentially a vascular shunt, a porta hepatis node measures 2.2 cm compared to 1.5 cm, no new lymphadenopathy  MRI abdomen 07/03/2018 stable segment 6 liver mass, stable segment 3 arterial phase enhancing lesion stable porta hepatis adenopathy, stable pancreas cystic lesion   2. Left leg and right foot pain  Negative left leg Doppler 07/14/2016  3. Right abdominal pain-resolved  4. Anorexia/weight loss-resolved  5. Benign appearing 16 mm cystic uncinatepancreas lesion noted on MRI 06/20/2016   Slightly larger on an MRI 03/08/2017, felt to most likely represent a side branch intraductal papillary mucinous neoplasm  Stable on MRI 10/09/2017  Stable on MRI 01/08/2018  Stable on MRI 04/03/2018  Stable on MRI 07/03/2018   Disposition: Ms. Bleich appears unchanged.  The liver mass and porta hepatis adenopathy were unchanged on an MRI last month.  She will be scheduled for an MRI  and office visit at a 60-month interval.  She will follow-up with gynecology to evaluate the vaginal bleeding.  I will see her after the restaging MRI in January.  She will contact us in the interim as needed.  15 minutes were spent with the patient today.  The majority of the time was used for counseling and coordination of care.  [name], MD  07/29/2018  11:47 AM

## 2018-08-08 DIAGNOSIS — N950 Postmenopausal bleeding: Secondary | ICD-10-CM | POA: Diagnosis not present

## 2018-08-08 DIAGNOSIS — N898 Other specified noninflammatory disorders of vagina: Secondary | ICD-10-CM | POA: Diagnosis not present

## 2018-08-21 DIAGNOSIS — I10 Essential (primary) hypertension: Secondary | ICD-10-CM | POA: Diagnosis not present

## 2018-08-21 DIAGNOSIS — N939 Abnormal uterine and vaginal bleeding, unspecified: Secondary | ICD-10-CM | POA: Diagnosis not present

## 2018-08-21 DIAGNOSIS — C228 Malignant neoplasm of liver, primary, unspecified as to type: Secondary | ICD-10-CM | POA: Diagnosis not present

## 2018-08-21 DIAGNOSIS — F430 Acute stress reaction: Secondary | ICD-10-CM | POA: Diagnosis not present

## 2018-08-21 DIAGNOSIS — Z23 Encounter for immunization: Secondary | ICD-10-CM | POA: Diagnosis not present

## 2018-08-21 DIAGNOSIS — D6489 Other specified anemias: Secondary | ICD-10-CM | POA: Diagnosis not present

## 2018-08-21 DIAGNOSIS — E7800 Pure hypercholesterolemia, unspecified: Secondary | ICD-10-CM | POA: Diagnosis not present

## 2018-08-21 DIAGNOSIS — D72819 Decreased white blood cell count, unspecified: Secondary | ICD-10-CM | POA: Diagnosis not present

## 2018-08-21 DIAGNOSIS — J302 Other seasonal allergic rhinitis: Secondary | ICD-10-CM | POA: Diagnosis not present

## 2018-08-21 DIAGNOSIS — K589 Irritable bowel syndrome without diarrhea: Secondary | ICD-10-CM | POA: Diagnosis not present

## 2018-08-21 DIAGNOSIS — Z6823 Body mass index (BMI) 23.0-23.9, adult: Secondary | ICD-10-CM | POA: Diagnosis not present

## 2018-08-21 DIAGNOSIS — R7302 Impaired glucose tolerance (oral): Secondary | ICD-10-CM | POA: Diagnosis not present

## 2018-08-24 ENCOUNTER — Emergency Department (HOSPITAL_COMMUNITY)
Admission: EM | Admit: 2018-08-24 | Discharge: 2018-08-24 | Disposition: A | Discharge status: Home / Self Care | Payer: PPO | Attending: Emergency Medicine | Admitting: Emergency Medicine

## 2018-08-24 ENCOUNTER — Emergency Department (HOSPITAL_COMMUNITY): Payer: PPO

## 2018-08-24 ENCOUNTER — Encounter (HOSPITAL_COMMUNITY): Payer: Self-pay

## 2018-08-24 DIAGNOSIS — R42 Dizziness and giddiness: Secondary | ICD-10-CM | POA: Diagnosis not present

## 2018-08-24 DIAGNOSIS — R55 Syncope and collapse: Secondary | ICD-10-CM | POA: Diagnosis not present

## 2018-08-24 DIAGNOSIS — M79605 Pain in left leg: Secondary | ICD-10-CM

## 2018-08-24 DIAGNOSIS — I10 Essential (primary) hypertension: Secondary | ICD-10-CM | POA: Diagnosis not present

## 2018-08-24 DIAGNOSIS — R61 Generalized hyperhidrosis: Secondary | ICD-10-CM | POA: Insufficient documentation

## 2018-08-24 DIAGNOSIS — M11262 Other chondrocalcinosis, left knee: Secondary | ICD-10-CM | POA: Diagnosis not present

## 2018-08-24 DIAGNOSIS — R1030 Lower abdominal pain, unspecified: Secondary | ICD-10-CM | POA: Diagnosis not present

## 2018-08-24 DIAGNOSIS — Z7902 Long term (current) use of antithrombotics/antiplatelets: Secondary | ICD-10-CM | POA: Insufficient documentation

## 2018-08-24 DIAGNOSIS — R110 Nausea: Secondary | ICD-10-CM | POA: Diagnosis not present

## 2018-08-24 DIAGNOSIS — Z859 Personal history of malignant neoplasm, unspecified: Secondary | ICD-10-CM | POA: Diagnosis not present

## 2018-08-24 DIAGNOSIS — R52 Pain, unspecified: Secondary | ICD-10-CM

## 2018-08-24 DIAGNOSIS — E876 Hypokalemia: Secondary | ICD-10-CM

## 2018-08-24 DIAGNOSIS — I959 Hypotension, unspecified: Secondary | ICD-10-CM | POA: Diagnosis not present

## 2018-08-24 DIAGNOSIS — Z79899 Other long term (current) drug therapy: Secondary | ICD-10-CM | POA: Insufficient documentation

## 2018-08-24 DIAGNOSIS — Z7982 Long term (current) use of aspirin: Secondary | ICD-10-CM | POA: Insufficient documentation

## 2018-08-24 LAB — BASIC METABOLIC PANEL
ANION GAP: 9 (ref 5–15)
BUN: 11 mg/dL (ref 8–23)
CHLORIDE: 104 mmol/L (ref 98–111)
CO2: 24 mmol/L (ref 22–32)
Calcium: 9.2 mg/dL (ref 8.9–10.3)
Creatinine, Ser: 0.59 mg/dL (ref 0.44–1.00)
GFR calc non Af Amer: >60 mL/min (ref >60)
Glucose, Bld: 156 mg/dL — ABNORMAL HIGH (ref 70–99)
POTASSIUM: 3.3 mmol/L — AB (ref 3.5–5.1)
Sodium: 137 mmol/L (ref 135–145)

## 2018-08-24 LAB — CBC WITH DIFFERENTIAL/PLATELET
ABS IMMATURE GRANULOCYTES: 0 10*3/uL (ref 0.0–0.1)
BASOS ABS: 0.1 10*3/uL (ref 0.0–0.1)
BASOS PCT: 1 %
Eosinophils Absolute: 0.5 10*3/uL (ref 0.0–0.7)
Eosinophils Relative: 6 %
HEMATOCRIT: 32.7 % — AB (ref 36.0–46.0)
Hemoglobin: 10.2 g/dL — ABNORMAL LOW (ref 12.0–15.0)
IMMATURE GRANULOCYTES: 0 %
Lymphocytes Relative: 6 %
Lymphs Abs: 0.5 10*3/uL — ABNORMAL LOW (ref 0.7–4.0)
MCH: 25.6 pg — AB (ref 26.0–34.0)
MCHC: 31.2 g/dL (ref 30.0–36.0)
MCV: 82 fL (ref 78.0–100.0)
MONOS PCT: 10 %
Monocytes Absolute: 0.8 10*3/uL (ref 0.1–1.0)
NEUTROS ABS: 6.2 10*3/uL (ref 1.7–7.7)
NEUTROS PCT: 77 %
PLATELETS: 220 10*3/uL (ref 150–400)
RBC: 3.99 MIL/uL (ref 3.87–5.11)
RDW: 13.9 % (ref 11.5–15.5)
WBC: 8.1 10*3/uL (ref 4.0–10.5)

## 2018-08-24 MED ORDER — POTASSIUM CHLORIDE CRYS ER 20 MEQ PO TBCR
20.0000 meq | EXTENDED_RELEASE_TABLET | Freq: Once | ORAL | Status: AC
  Administered 2018-08-24: 20 meq via ORAL
  Filled 2018-08-24: qty 1

## 2018-08-24 MED ORDER — ONDANSETRON HCL 4 MG/2ML IJ SOLN
4.0000 mg | Freq: Once | INTRAMUSCULAR | Status: AC
  Administered 2018-08-24: 4 mg via INTRAVENOUS
  Filled 2018-08-24: qty 2

## 2018-08-24 MED ORDER — SODIUM CHLORIDE 0.9 % IV BOLUS
1000.0000 mL | Freq: Once | INTRAVENOUS | Status: AC
  Administered 2018-08-24: 1000 mL via INTRAVENOUS

## 2018-08-24 MED ORDER — FENTANYL CITRATE (PF) 100 MCG/2ML IJ SOLN
50.0000 ug | Freq: Once | INTRAMUSCULAR | Status: AC
  Administered 2018-08-24: 50 ug via INTRAVENOUS
  Filled 2018-08-24: qty 2

## 2018-08-24 NOTE — ED Triage Notes (Signed)
[facility] reports pt had a near syncopal episode sitting on the toilet to urinate. Pt initially was sweaty and nauseous. She reports 10/10 pain in left groin, hip, leg. This pain has been intermittent for at least a month, with unsuccessful imaging. She is also being treated vag bleeding and d/c.  Hx of pancreatic cx,.  160/66  66 hr rr 20 96% RA 189 cbg

## 2018-08-24 NOTE — ED Notes (Signed)
Pt was able to ambulate in hallway with walker.  She required one assist to stand from bed.

## 2018-08-24 NOTE — Discharge Instructions (Signed)
Your evaluated in the emergency department for a fainting spell in the setting of worsening of your right leg pain.  You had blood work that showed your potassium was a little low and we gave you some medication for that.  Your x-rays of your left hip femur and knee did not show any obvious findings.  Will be important for you to follow-up with your primary care doctor as you may end up needing an MRI of your back or other further evaluation for your pain.  Please return if any concerns.

## 2018-08-24 NOTE — ED Provider Notes (Signed)
[facility] Provider Note   CSN: [id number] Arrival date & time: 08/24/18  1203     History   Chief Complaint Chief Complaint  Patient presents with  . Near Syncope    HPI Laura [name] is a 77 y.o. female.  Is been dealing with atraumatic left hip thigh and knee pain for a month or 2.  She said she went to her doctor and they wanted to get x-rays but she could not tolerate it and so they gave her steroids her symptoms had improved but have recurred.  He was having more severe pain today and her friends stop by to see her.  She needed to go to the bathroom and they helped her to the commode but she felt lightheaded on the way there and while on the commode she had a syncopal event.  They said she was briefly out and then when she was regaining consciousness she had some slurred speech that quickly improved.  She was nauseous and sweaty.  She feels back to baseline from that now and states she has passed out before.  She is now only complaining of 10 out of 10 pain to her left leg that was there prior to the syncopal event.  The history is provided by the patient.  Near Syncope  This is a new problem. The current episode started less than 1 hour ago. The problem has been resolved. Pertinent negatives include no chest pain, no abdominal pain, no headaches and no shortness of breath. Nothing aggravates the symptoms. The symptoms are relieved by lying down. She has tried rest for the symptoms. The treatment provided significant relief.    Past Medical History:  Diagnosis Date  . Cancer ([place])   . GERD (gastroesophageal reflux disease)   . Heart murmur   . High cholesterol   . Hypertension   . IBS (irritable bowel syndrome)   . PONV (postoperative nausea and vomiting)   . Pre-diabetes     Patient Active Problem List   Diagnosis Date Noted  . Cholangiocarcinoma ([place]) 11/03/2016  . Palpitation 07/27/2014    Past Surgical History:  Procedure  Laterality Date  . BREAST EXCISIONAL BIOPSY Right 1960   scar is not visible   . BREAST SURGERY     Benign  . CHOLECYSTECTOMY    . IR ANGIOGRAM SELECTIVE EACH ADDITIONAL VESSEL  07/05/2017  . IR ANGIOGRAM SELECTIVE EACH ADDITIONAL VESSEL  07/05/2017  . IR ANGIOGRAM SELECTIVE EACH ADDITIONAL VESSEL  07/05/2017  . IR ANGIOGRAM SELECTIVE EACH ADDITIONAL VESSEL  07/19/2017  . IR ANGIOGRAM VISCERAL SELECTIVE  07/05/2017  . IR ANGIOGRAM VISCERAL SELECTIVE  07/05/2017  . IR ANGIOGRAM VISCERAL SELECTIVE  07/19/2017  . IR EMBO ARTERIAL NOT HEMORR HEMANG INC GUIDE ROADMAPPING  07/05/2017  . IR EMBO TUMOR ORGAN ISCHEMIA INFARCT INC GUIDE ROADMAPPING  07/19/2017  . IR GENERIC HISTORICAL  09/27/2016   IR RADIOLOGIST EVAL & MGMT 09/27/2016 Greggory Keen, MD [facility] INTERV RAD  . IR GENERIC HISTORICAL  12/07/2016   IR RADIOLOGIST EVAL & MGMT 12/07/2016 Greggory Keen, MD [facility] INTERV RAD  . IR RADIOLOGIST EVAL & MGMT  03/08/2017  . IR RADIOLOGIST EVAL & MGMT  08/16/2017  . IR RADIOLOGIST EVAL & MGMT  10/09/2017  . IR RADIOLOGIST EVAL & MGMT  06/14/2017  . IR RADIOLOGIST EVAL & MGMT  01/08/2018  . IR RADIOLOGIST EVAL & MGMT  04/03/2018  . IR RADIOLOGIST EVAL & MGMT  07/03/2018  . IR US  GUIDE VASC ACCESS RIGHT  07/05/2017  . IR US GUIDE VASC ACCESS RIGHT  07/19/2017  . TOTAL ABDOMINAL HYSTERECTOMY       OB History   None      Home Medications    Prior to Admission medications   Medication Sig Start Date End Date Taking? Authorizing Provider  acetaminophen (TYLENOL) 500 MG tablet Take 1,000 mg by mouth every 6 (six) hours as needed for mild pain.   Yes [provider]  amLODipine (NORVASC) 5 MG tablet Take 5 mg by mouth daily.  06/22/14  Yes [provider]  aspirin EC 81 MG tablet Take 81 mg by mouth daily.   Yes [provider]  Biotin 5000 MCG TABS Take 5,000 mcg by mouth daily.    Yes [provider]  Cholecalciferol (VITAMIN D3) 5000 units CAPS Take 5,000 Units by mouth  daily.   Yes [provider]  clonazePAM (KLONOPIN) 0.5 MG tablet Take 0.5-1 tablets (0.25-0.5 mg total) by mouth 2 (two) times daily as needed for anxiety. Take 0.25mg s twice daily and 0.5mg s at bedtime, may take an additional 0.25mg s as needed for anxiety Patient taking differently: Take 0.25-0.5 mg by mouth See admin instructions. Take 0.25mg s twice daily and 0.5mg s at bedtime, May take an additional 0.25mg s as needed for anxiety 07/19/18  Yes Ladell Pier, MD  docusate sodium (COLACE) 50 MG capsule Take 50 mg by mouth 3 (three) times daily as needed for mild constipation.   Yes [provider]  doxazosin (CARDURA) 1 MG tablet Take 1 mg by mouth at bedtime.  06/22/14  Yes [provider]  methocarbamol (ROBAXIN) 750 MG tablet Take 1 tablet by mouth 3 (three) times daily as needed for muscle spasms.  07/12/18  Yes [provider]  metroNIDAZOLE (METROGEL) 0.75 % vaginal gel Place 1 Applicatorful vaginally at bedtime. For 5 Days 08/21/18  Yes [provider]  Multiple Vitamins-Minerals (CENTRUM SILVER ADULT 50+ PO) Take 1 tablet by mouth.   Yes [provider]  omeprazole (PRILOSEC) 20 MG capsule Take 20 mg by mouth 2 (two) times daily. 06/25/17  Yes [provider]  ondansetron (ZOFRAN) 4 MG tablet Take 4 mg by mouth every 6 (six) hours as needed. for nausea 07/12/18  Yes [provider]  polyethylene glycol (MIRALAX / GLYCOLAX) packet Take 17 g by mouth daily as needed for mild constipation.   Yes [provider]  pravastatin (PRAVACHOL) 40 MG tablet Take 40 mg by mouth every evening.    Yes [provider]  tetrahydrozoline 0.05 % ophthalmic solution Place 1 drop into both eyes daily as needed (dry eye).    Yes [provider]  prochlorperazine (COMPAZINE) 5 MG tablet Take 1 tablet (5 mg total) by mouth every 6 (six) hours as needed for nausea or vomiting. 08/03/17   Ladell Pier, MD    Family  History Family History  Problem Relation Age of Onset  . Alzheimer's disease Mother   . Alzheimer's disease Brother   . Alzheimer's disease Sister   . Breast cancer Sister   . CAD Brother 31       CABG  . Cancer Sister        Breast  . Cancer Brother        Lung    Social History Social History   Tobacco Use  . Smoking status: Never Smoker  . Smokeless tobacco: Never Used  Substance Use Topics  . Alcohol use: No  .  Drug use: No     Allergies   Pneumococcal vaccines and Codeine   Review of Systems Review of Systems  Constitutional: Negative for fever.  HENT: Negative for sore throat.   Eyes: Negative for visual disturbance.  Respiratory: Negative for shortness of breath.   Cardiovascular: Positive for near-syncope. Negative for chest pain.  Gastrointestinal: Negative for abdominal pain.  Genitourinary: Negative for dysuria.  Musculoskeletal: Positive for arthralgias, back pain and gait problem.  Skin: Negative for rash.  Neurological: Positive for syncope. Negative for headaches.     Physical Exam Updated Vital Signs BP (!) 130/52 (BP Location: Right Arm)   Pulse 86   Resp 20   Ht 5\' 5"  (1.651 m)   Wt 64 kg   SpO2 98%   BMI 23.46 kg/m   Physical Exam  Constitutional: She is oriented to person, place, and time. She appears well-developed and well-nourished. No distress.  HENT:  Head: Normocephalic and atraumatic.  Eyes: Conjunctivae are normal.  Neck: Neck supple.  Cardiovascular: Normal rate, regular rhythm and normal heart sounds.  No murmur heard. Pulmonary/Chest: Effort normal and breath sounds normal. No respiratory distress.  Abdominal: Soft. There is no tenderness.  Musculoskeletal: She exhibits tenderness. She exhibits no edema or deformity.  She is market tenderness on palpation of her left groin and with any movement of her left hip.  She also has some pain with attempting to flex her knee.  There is no significant swelling or color change  of that leg.  Intact distal pulses.  Neurological: She is alert and oriented to person, place, and time. She has normal strength. No sensory deficit.  Skin: Skin is warm and dry.  Psychiatric: She has a normal mood and affect.  Nursing note and vitals reviewed.    ED Treatments / Results  Labs (all labs ordered are listed, but only abnormal results are displayed) Labs Reviewed  BASIC METABOLIC PANEL - Abnormal; Notable for the following components:      Result Value   Potassium 3.3 (*)    Glucose, Bld 156 (*)    All other components within normal limits  CBC WITH DIFFERENTIAL/PLATELET - Abnormal; Notable for the following components:   Hemoglobin 10.2 (*)    HCT 32.7 (*)    MCH 25.6 (*)    Lymphs Abs 0.5 (*)    All other components within normal limits    EKG EKG Interpretation  Date/Time:  Saturday August 24 2018 12:07:40 EDT Ventricular Rate:  74 PR Interval:    QRS Duration: 109 QT Interval:  392 QTC Calculation: 435 R Axis:   -39 Text Interpretation:  Sinus rhythm Atrial premature complex Probable left atrial enlargement Left axis deviation similar to prior 11/17 Confirmed by Aletta Edouard [phone]) on 08/24/2018 12:18:54 PM   Radiology Dg Pelvis 1-2 Views  Result Date: 08/24/2018 CLINICAL DATA:  Left groin pain EXAM: PELVIS - 1-2 VIEW COMPARISON:  None. FINDINGS: Mild degenerative change in both hips with mild joint space narrowing and spurring Negative for fracture or mass. IMPRESSION: No acute abnormality. Electronically Signed   By: Franchot Gallo M.D.   On: 08/24/2018 14:36   Dg Knee Complete 4 Views Left  Result Date: 08/24/2018 CLINICAL DATA:  Left leg pain EXAM: LEFT KNEE - COMPLETE 4+ VIEW COMPARISON:  None. FINDINGS: Negative for fracture. Chondrocalcinosis medially and laterally. Mild joint space narrowing laterally. Patellofemoral joint mild degenerative change. Negative for effusion. Mild calcification in the medial collateral ligament due to chronic  injury. IMPRESSION:  Chondrocalcinosis and mild degenerative changes. No acute abnormality. Electronically Signed   By: Franchot Gallo M.D.   On: 08/24/2018 14:34   Dg Femur Min 2 Views Left  Result Date: 08/24/2018 CLINICAL DATA:  Left leg pain EXAM: LEFT FEMUR 2 VIEWS COMPARISON:  None. FINDINGS: Negative for fracture or mass. Mild degenerative change in the left hip and left knee IMPRESSION: No acute abnormality. Electronically Signed   By: Franchot Gallo M.D.   On: 08/24/2018 14:35    Procedures Procedures (including critical care time)  Medications Ordered in ED Medications  fentaNYL (SUBLIMAZE) injection 50 mcg (50 mcg Intravenous Given 08/24/18 1312)  sodium chloride 0.9 % bolus 1,000 mL (0 mLs Intravenous Stopped 08/24/18 1422)  ondansetron (ZOFRAN) injection 4 mg (4 mg Intravenous Given 08/24/18 1312)  potassium chloride SA (K-DUR,KLOR-CON) CR tablet 20 mEq (20 mEq Oral Given 08/24/18 1420)     Initial Impression / Assessment and Plan / ED Course  I have reviewed the triage vital signs and the nursing notes.  Pertinent labs & imaging results that were available during my care of the patient were reviewed by me and considered in my medical decision making (see chart for details).  Clinical Course as of Aug 25 957  Sat Aug 24, 2018  1453 Patient's lab work was fairly unremarkable other than a slightly low potassium.  Her imaging also did not show any acute fractures.  She is hoping to be discharged so I told her we give her something p.o. and then get her up walker and see if she was stable for discharge.  She understands that she is probably getting need to continue to follow-up with her primary care doctor regarding her pain and she may end up needing an MRI of her lumbar spine if this is more radicular.   [MB]  1829 Patient was able to ambulate here with a walker.  She needed a little assistance to get up and it seems like her hip pain was limiting her as much as anything.  She is  comfortable going home and when we talked about further pain medicine it sounds like many of them give her more side effects than benefits.  She like to continue just taking her Tylenol and will follow up with her doctor.  She also has an outpatient orthopedist appointment which I think is very reasonable for her.   [MB]    Clinical Course User Index [MB] Hayden Rasmussen, MD     Final Clinical Impressions(s) / ED Diagnoses   Final diagnoses:  Syncope and collapse  Left leg pain  Hypokalemia    ED Discharge Orders    None       Hayden Rasmussen, MD 08/25/18 [phone]

## 2018-08-24 NOTE — ED Notes (Signed)
Pt alert and oriented in NAD. Pt verbalized understanding of discharge instructions. 

## 2018-08-28 ENCOUNTER — Other Ambulatory Visit (HOSPITAL_COMMUNITY): Payer: Self-pay | Admitting: Registered Nurse

## 2018-08-28 DIAGNOSIS — N939 Abnormal uterine and vaginal bleeding, unspecified: Secondary | ICD-10-CM | POA: Diagnosis not present

## 2018-08-28 DIAGNOSIS — M5410 Radiculopathy, site unspecified: Secondary | ICD-10-CM | POA: Diagnosis not present

## 2018-08-28 DIAGNOSIS — Z6823 Body mass index (BMI) 23.0-23.9, adult: Secondary | ICD-10-CM | POA: Diagnosis not present

## 2018-08-28 DIAGNOSIS — Z8679 Personal history of other diseases of the circulatory system: Secondary | ICD-10-CM | POA: Diagnosis not present

## 2018-08-28 DIAGNOSIS — E876 Hypokalemia: Secondary | ICD-10-CM | POA: Diagnosis not present

## 2018-08-30 ENCOUNTER — Ambulatory Visit: Payer: PPO | Admitting: Podiatry

## 2018-09-03 ENCOUNTER — Ambulatory Visit (HOSPITAL_COMMUNITY)
Admission: RE | Admit: 2018-09-03 | Discharge: 2018-09-03 | Disposition: A | Discharge status: Home / Self Care | Payer: PPO | Source: Ambulatory Visit | Attending: Registered Nurse | Admitting: Registered Nurse

## 2018-09-03 ENCOUNTER — Ambulatory Visit (HOSPITAL_COMMUNITY): Payer: PPO

## 2018-09-03 ENCOUNTER — Ambulatory Visit: Payer: PPO | Admitting: Podiatry

## 2018-09-03 DIAGNOSIS — M5136 Other intervertebral disc degeneration, lumbar region: Secondary | ICD-10-CM | POA: Diagnosis not present

## 2018-09-03 DIAGNOSIS — M5126 Other intervertebral disc displacement, lumbar region: Secondary | ICD-10-CM | POA: Insufficient documentation

## 2018-09-03 DIAGNOSIS — M48061 Spinal stenosis, lumbar region without neurogenic claudication: Secondary | ICD-10-CM | POA: Diagnosis not present

## 2018-09-03 DIAGNOSIS — M5410 Radiculopathy, site unspecified: Secondary | ICD-10-CM

## 2018-09-09 ENCOUNTER — Other Ambulatory Visit: Payer: Self-pay | Admitting: *Deleted

## 2018-09-09 ENCOUNTER — Encounter: Payer: Self-pay | Admitting: Podiatry

## 2018-09-09 ENCOUNTER — Ambulatory Visit: Payer: PPO | Admitting: Podiatry

## 2018-09-09 DIAGNOSIS — B351 Tinea unguium: Secondary | ICD-10-CM | POA: Diagnosis not present

## 2018-09-09 DIAGNOSIS — M79675 Pain in left toe(s): Secondary | ICD-10-CM

## 2018-09-09 DIAGNOSIS — M79674 Pain in right toe(s): Secondary | ICD-10-CM | POA: Diagnosis not present

## 2018-09-09 MED ORDER — CLONAZEPAM 0.5 MG PO TABS: 0.2500 mg | ORAL_TABLET | Freq: Two times a day (BID) | ORAL | 0 refills | Status: DC | PRN

## 2018-09-09 NOTE — Progress Notes (Signed)
Subjective: 77 y.o. returns the office today for painful, elongated, thickened toenails which she cannot trim herself. Denies any redness or drainage around the nails. She states the area of callus that I trimmed previously has been doing very well. Denies any acute changes since last appointment and no new complaints today. Denies any systemic complaints such as fevers, chills, nausea, vomiting.   PCP: Haywood Pao, MD  Objective: AAO 3, NAD DP/PT pulses palpable, CRT less than 3 seconds Nails hypertrophic, dystrophic, elongated, brittle, discolored 10. There is tenderness overlying the nails 1-5 bilaterally. There is no surrounding erythema or drainage along the nail sites. No open lesions or pre-ulcerative lesions are identified. No other areas of tenderness bilateral lower extremities. No overlying edema, erythema, increased warmth. No pain with calf compression, swelling, warmth, erythema.  Assessment: Patient presents with symptomatic onychomycosis; hyperkeratotic lesion  Plan: -Treatment options including alternatives, risks, complications were discussed -Nails sharply debrided 10 without complication/bleeding. -No significant hyperkeratotic tissue today -Discussed daily foot inspection. If there are any changes, to call the office immediately.  -Follow-up in 3 months or sooner if any problems are to arise. In the meantime, encouraged to call the office with any questions, concerns, changes symptoms.  Celesta Gentile, DPM

## 2018-09-10 ENCOUNTER — Ambulatory Visit (INDEPENDENT_AMBULATORY_CARE_PROVIDER_SITE_OTHER): Payer: PPO | Admitting: Orthopaedic Surgery

## 2018-10-02 ENCOUNTER — Other Ambulatory Visit (HOSPITAL_COMMUNITY): Payer: Self-pay | Admitting: Interventional Radiology

## 2018-10-02 DIAGNOSIS — R160 Hepatomegaly, not elsewhere classified: Secondary | ICD-10-CM

## 2018-10-04 ENCOUNTER — Ambulatory Visit: Payer: PPO

## 2018-10-09 ENCOUNTER — Encounter: Payer: Self-pay | Admitting: Radiology

## 2018-10-09 ENCOUNTER — Other Ambulatory Visit: Payer: Self-pay | Admitting: Radiology

## 2018-10-10 DIAGNOSIS — N898 Other specified noninflammatory disorders of vagina: Secondary | ICD-10-CM | POA: Diagnosis not present

## 2018-10-18 ENCOUNTER — Telehealth: Payer: Self-pay | Admitting: *Deleted

## 2018-10-18 ENCOUNTER — Other Ambulatory Visit: Payer: Self-pay | Admitting: Nurse Practitioner

## 2018-10-18 DIAGNOSIS — C221 Intrahepatic bile duct carcinoma: Secondary | ICD-10-CM

## 2018-10-18 MED ORDER — CLONAZEPAM 0.5 MG PO TABS: 0.2500 mg | ORAL_TABLET | Freq: Two times a day (BID) | ORAL | 0 refills | Status: DC | PRN

## 2018-10-18 NOTE — Telephone Encounter (Signed)
Patient is calling for a refill of her Klonopin. She would like it sent to her [facility] please.

## 2018-10-24 ENCOUNTER — Ambulatory Visit: Payer: PPO

## 2018-10-24 DIAGNOSIS — L929 Granulomatous disorder of the skin and subcutaneous tissue, unspecified: Secondary | ICD-10-CM | POA: Diagnosis not present

## 2018-10-25 DIAGNOSIS — Z1231 Encounter for screening mammogram for malignant neoplasm of breast: Secondary | ICD-10-CM | POA: Diagnosis not present

## 2018-11-06 ENCOUNTER — Other Ambulatory Visit: Payer: PPO

## 2018-11-06 ENCOUNTER — Ambulatory Visit (HOSPITAL_COMMUNITY): Admission: RE | Admit: 2018-11-06 | Payer: PPO | Source: Ambulatory Visit

## 2018-11-20 ENCOUNTER — Ambulatory Visit
Admission: RE | Admit: 2018-11-20 | Discharge: 2018-11-20 | Disposition: A | Discharge status: Home / Self Care | Payer: PPO | Source: Ambulatory Visit | Attending: Interventional Radiology | Admitting: Interventional Radiology

## 2018-11-20 ENCOUNTER — Ambulatory Visit (HOSPITAL_COMMUNITY)
Admission: RE | Admit: 2018-11-20 | Discharge: 2018-11-20 | Disposition: A | Discharge status: Home / Self Care | Payer: PPO | Source: Ambulatory Visit | Attending: Oncology | Admitting: Oncology

## 2018-11-20 DIAGNOSIS — C220 Liver cell carcinoma: Secondary | ICD-10-CM | POA: Diagnosis not present

## 2018-11-20 DIAGNOSIS — K769 Liver disease, unspecified: Secondary | ICD-10-CM | POA: Diagnosis not present

## 2018-11-20 DIAGNOSIS — Z9889 Other specified postprocedural states: Secondary | ICD-10-CM | POA: Diagnosis not present

## 2018-11-20 DIAGNOSIS — R160 Hepatomegaly, not elsewhere classified: Secondary | ICD-10-CM

## 2018-11-20 DIAGNOSIS — K869 Disease of pancreas, unspecified: Secondary | ICD-10-CM | POA: Diagnosis not present

## 2018-11-20 DIAGNOSIS — C221 Intrahepatic bile duct carcinoma: Secondary | ICD-10-CM

## 2018-11-20 DIAGNOSIS — C787 Secondary malignant neoplasm of liver and intrahepatic bile duct: Secondary | ICD-10-CM | POA: Diagnosis not present

## 2018-11-20 DIAGNOSIS — Z859 Personal history of malignant neoplasm, unspecified: Secondary | ICD-10-CM | POA: Diagnosis not present

## 2018-11-20 DIAGNOSIS — Z8505 Personal history of malignant neoplasm of liver: Secondary | ICD-10-CM | POA: Diagnosis not present

## 2018-11-20 HISTORY — PX: IR RADIOLOGIST EVAL & MGMT: IMG5224

## 2018-11-20 LAB — POCT I-STAT CREATININE: CREATININE: 0.6 mg/dL (ref 0.44–1.00)

## 2018-11-20 MED ORDER — GADOBUTROL 1 MMOL/ML IV SOLN
6.0000 mL | Freq: Once | INTRAVENOUS | Status: AC | PRN
  Administered 2018-11-20: 6 mL via INTRAVENOUS

## 2018-11-20 NOTE — Progress Notes (Signed)
Patient ID: Laura [name], female   DOB: 09-08-1941, 77 y.o.   MRN: [id number]       Chief Complaint:  59-month status post right hepatic Y 90 radial embolization for cholangiocarcinoma  Referring Physician(s): Sherrill  History of Present Illness: Laura [name] is a 77 y.o. 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  She is now 20-month status post right hepatic Y 90 radio and was H performed 07/19/2017.  She continues to undergo surveillance MRI imaging.  She returns today for review outpatient imaging.  She remains asymptomatic.  [name] current physical limitations.  Stable functional status.  [name] signs of liver failure or jaundice.  [name] current abdominal pain or flank pain.  Stable weight and appetite.  [name] new symptoms.  Past Medical History:  Diagnosis Date  . Cancer ([place])   . GERD (gastroesophageal reflux disease)   . Heart murmur   . High cholesterol   . Hypertension   . IBS (irritable bowel syndrome)   . PONV (postoperative nausea and vomiting)   . Pre-diabetes     Past Surgical History:  Procedure Laterality Date  . BREAST EXCISIONAL BIOPSY Right 1960   scar is not visible   . BREAST SURGERY     Benign  . CHOLECYSTECTOMY    . IR ANGIOGRAM SELECTIVE EACH ADDITIONAL VESSEL  07/05/2017  . IR ANGIOGRAM SELECTIVE EACH ADDITIONAL VESSEL  07/05/2017  . IR ANGIOGRAM SELECTIVE EACH ADDITIONAL VESSEL  07/05/2017  . IR ANGIOGRAM SELECTIVE EACH ADDITIONAL VESSEL  07/19/2017  . IR ANGIOGRAM VISCERAL SELECTIVE  07/05/2017  . IR ANGIOGRAM VISCERAL SELECTIVE  07/05/2017  . IR ANGIOGRAM VISCERAL SELECTIVE  07/19/2017  . IR EMBO ARTERIAL NOT HEMORR HEMANG INC GUIDE ROADMAPPING  07/05/2017  . IR EMBO TUMOR ORGAN ISCHEMIA INFARCT INC GUIDE ROADMAPPING  07/19/2017  . IR GENERIC HISTORICAL  09/27/2016   IR RADIOLOGIST EVAL & MGMT 09/27/2016 Greggory Keen, MD [facility] INTERV RAD  . IR GENERIC HISTORICAL  12/07/2016   IR RADIOLOGIST EVAL & MGMT 12/07/2016 Greggory Keen, MD  [facility] INTERV RAD  . IR RADIOLOGIST EVAL & MGMT  03/08/2017  . IR RADIOLOGIST EVAL & MGMT  08/16/2017  . IR RADIOLOGIST EVAL & MGMT  10/09/2017  . IR RADIOLOGIST EVAL & MGMT  06/14/2017  . IR RADIOLOGIST EVAL & MGMT  01/08/2018  . IR RADIOLOGIST EVAL & MGMT  04/03/2018  . IR RADIOLOGIST EVAL & MGMT  07/03/2018  . IR RADIOLOGIST EVAL & MGMT  11/20/2018  . IR US GUIDE VASC ACCESS RIGHT  07/05/2017  . IR US GUIDE VASC ACCESS RIGHT  07/19/2017  . TOTAL ABDOMINAL HYSTERECTOMY      Allergies: Pneumococcal vaccines and Codeine  Medications: Prior to Admission medications   Medication Sig Start Date End Date Taking? Authorizing Provider  acetaminophen (TYLENOL) 500 MG tablet Take 1,000 mg by mouth every 6 (six) hours as needed for mild pain.   Yes [provider]  amLODipine (NORVASC) 5 MG tablet Take 5 mg by mouth daily.  06/22/14  Yes [provider]  aspirin EC 81 MG tablet Take 81 mg by mouth daily.   Yes [provider]  Biotin 5000 MCG TABS Take 5,000 mcg by mouth daily.    Yes [provider]  Cholecalciferol (VITAMIN D3) 5000 units CAPS Take 5,000 Units by mouth daily.   Yes [provider]  clonazePAM (KLONOPIN) 0.5 MG tablet Take 0.5-1 tablets (0.25-0.5 mg total) by mouth 2 (two) times daily as needed for anxiety. Take  0.25mg s twice daily and 0.5mg s at bedtime, may take an additional 0.25mg s as needed for anxiety 10/18/18  Yes Owens Shark, NP  docusate sodium (COLACE) 50 MG capsule Take 50 mg by mouth 3 (three) times daily as needed for mild constipation.   Yes [provider]  doxazosin (CARDURA) 1 MG tablet Take 1 mg by mouth at bedtime.  06/22/14  Yes [provider]  methocarbamol (ROBAXIN) 750 MG tablet Take 1 tablet by mouth 3 (three) times daily as needed for muscle spasms.  07/12/18  Yes [provider]  Multiple Vitamins-Minerals (CENTRUM SILVER ADULT 50+ PO) Take 1 tablet by mouth.   Yes [provider]    omeprazole (PRILOSEC) 20 MG capsule Take 20 mg by mouth 2 (two) times daily. 06/25/17  Yes [provider]  ondansetron (ZOFRAN) 4 MG tablet Take 4 mg by mouth every 6 (six) hours as needed. for nausea 07/12/18  Yes [provider]  polyethylene glycol (MIRALAX / GLYCOLAX) packet Take 17 g by mouth daily as needed for mild constipation.   Yes [provider]  pravastatin (PRAVACHOL) 40 MG tablet Take 40 mg by mouth every evening.    Yes [provider]  prochlorperazine (COMPAZINE) 5 MG tablet Take 1 tablet (5 mg total) by mouth every 6 (six) hours as needed for nausea or vomiting. 08/03/17  Yes Ladell Pier, MD  tetrahydrozoline 0.05 % ophthalmic solution Place 1 drop into both eyes daily as needed (dry eye).    Yes [provider]  clindamycin (CLEOCIN) 2 % vaginal cream  08/29/18   [provider]     Family History  Problem Relation Age of Onset  . Alzheimer's disease Mother   . Alzheimer's disease Brother   . Alzheimer's disease Sister   . Breast cancer Sister   . CAD Brother 21       CABG  . Cancer Sister        Breast  . Cancer Brother        Lung    Social History   Socioeconomic History  . Marital status: Widowed    Spouse name: Lanae Boast  . Number of children: 3  . Years of education: Not on file  . Highest education level: Not on file  Occupational History  . Occupation: Retired  Scientific laboratory technician  . Financial resource strain: Not on file  . Food insecurity:    Worry: Not on file    Inability: Not on file  . Transportation needs:    Medical: Not on file    Non-medical: Not on file  Tobacco Use  . Smoking status: Never Smoker  . Smokeless tobacco: Never Used  Substance and Sexual Activity  . Alcohol use: [name]  . Drug use: [name]  . Sexual activity: Not on file  Lifestyle  . Physical activity:    Days per week: Not on file    Minutes per session: Not on file  . Stress: Not on file  Relationships  . Social  connections:    Talks on phone: Not on file    Gets together: Not on file    Attends religious service: Not on file    Active member of club or organization: Not on file    Attends meetings of clubs or organizations: Not on file    Relationship status: Not on file  Other Topics Concern  . Not on file  Social History Narrative   Lives at home with husband, Lanae Boast who is  in poor health (she is caregiver)   Has #3 daughters   Retired from working at [employer]   Has total of #7 siblings    ECOG Status: 1 - Symptomatic but completely ambulatory  Review of Systems: A 12 point ROS discussed and pertinent positives are indicated in the HPI above.  All other systems are negative.  Review of Systems  Vital Signs: BP (!) 147/61 (BP Location: Right Arm, Patient Position: Sitting, Cuff Size: Normal)   Pulse 67   Temp 97.7 F (36.5 C)   Resp 16   SpO2 98%   Physical Exam  Constitutional: She is oriented to person, place, and time. She appears well-developed and well-nourished. [name] distress.  Eyes: Conjunctivae are normal. [name] scleral icterus.  Cardiovascular: Normal rate and regular rhythm.  Pulmonary/Chest: Effort normal and breath sounds normal.  Abdominal: Soft. Bowel sounds are normal. She exhibits [name] distension. There is [name] tenderness.  Neurological: She is alert and oriented to person, place, and time.  Skin: Skin is warm and dry. She is not diaphoretic. [name] erythema.  Psychiatric: She has a normal mood and affect. Her behavior is normal.     Imaging: Mr Abdomen W Wo Contrast  Result Date: 11/20/2018 CLINICAL DATA:  Right hepatic lobe adenocarcinoma, presumed cholangiocarcinoma. Status post Y90 therapy. EXAM: MRI ABDOMEN WITHOUT AND WITH CONTRAST TECHNIQUE: Multiplanar multisequence MR imaging of the abdomen was performed both before and after the administration of intravenous contrast. CONTRAST:  6 cc Gadavist COMPARISON:  07/03/2018 FINDINGS: Lower chest: Unremarkable.  Hepatobiliary: Similar appearance of the posterior right hepatic lesion measuring 3.1 x 3.3 cm today compared to 3.0 x 3.5 cm previously. The irregular nodular enhancement described previously in this lesion persists. 1.5 x 1.7 cm hypervascular focus along the cranial margin of the original lesion (52/902) is similar to prior studies, specially when comparing coronal postcontrast sequences. Since the prior study, a new 2.1 x 2.0 cm rim enhancing lesion has developed in the lateral segment of the left liver (segment II) seen on 35/901. A new 8 mm hypervascular focus is identified in the posterior subcapsular parenchyma of the medial segment left liver (45/901). Previously characterized tiny hypervascular lesion in the lateral segment left liver (60/901) is unchanged. Gallbladder surgically absent. [name] intrahepatic or extrahepatic biliary dilation. Pancreas: 2.0 x 1.3 cm cystic lesion in the head of pancreas is stable. [name] dilatation of the main duct. Spleen:  [name] splenomegaly. [name] focal mass lesion. Adrenals/Urinary Tract: [name] adrenal nodule or mass. Tiny cortical cyst in the interpolar left kidney is stable. Central sinus cysts are evident in the kidneys bilaterally Stomach/Bowel: Stomach unremarkable. [name] small bowel or colonic dilatation within the visualized abdomen. Vascular/Lymphatic: [name] abdominal aortic aneurysm. 2.1 cm short axis necrotic hepato duodenal ligament lymph node is stable. Other:  [name] free fluid evident. Musculoskeletal: [name] abnormal marrow enhancement within the visualized bony anatomy. IMPRESSION: 1. Interval development of a 2.1 x 2.0 cm rim enhancing lesion in the lateral segment left liver, concerning for metastatic disease. 2. New 8 mm hypervascular focus posterior parenchyma of the medial segment left liver also concerning for new metastatic disease. 3. [name] substantial change in the posterior right hepatic mass with persistent central nodular enhancement and enhancement along the cranial margin. 4.  Small hypervascular lesion in the lateral segment left liver, previously characterized as likely FNH, is stable in the interval. 5. [name] change necrotic hepato duodenal ligament lymph node. 6. [name] change cystic lesion head of pancreas. Electronically Signed   By:  Misty Stanley M.D.   On: 11/20/2018 09:52   Ir Radiologist Eval & Mgmt  Result Date: 11/20/2018 Please refer to notes tab for details about interventional procedure. (Op Note)   Labs:  CBC: Recent Labs    08/24/18 1307  WBC 8.1  HGB 10.2*  HCT 32.7*  PLT 220    COAGS: [name] results for input(s): INR, APTT in the last 8760 hours.  BMP: Recent Labs    04/03/18 0831 07/03/18 0700 08/24/18 1307 11/20/18 0706  NA  --   --  137  --   K  --   --  3.3*  --   CL  --   --  104  --   CO2  --   --  24  --   GLUCOSE  --   --  156*  --   BUN  --   --  11  --   CALCIUM  --   --  9.2  --   CREATININE 0.60 0.60 0.59 0.60  GFRNONAA  --   --  >60  --   GFRAA  --   --  >60  --     LIVER FUNCTION TESTS: [name] results for input(s): BILITOT, AST, ALT, ALKPHOS, PROT, ALBUMIN in the last 8760 hours.  TUMOR MARKERS: [name] results for input(s): AFPTM, CEA, CA199, CHROMGRNA in the last 8760 hours.  Assessment and Plan:  Unfortunately, surveillance MRI today demonstrates 2 new enhancing lesions in the left hepatic lobe one in the medial segment and one in the lateral segment measuring up to 2 cm.  There are stable posttreatment changes/residual tumor in the right hepatic lobe inferiorly.  Stable necrotic periportal adenopathy.  [name] new extrahepatic disease.  [name] ascites.  Plan: 2 new left hepatic metastatic lesions by surveillance MRI.  Treatment options were reviewed including left hepatic Y 90 embolization.  Prior to left hepatic Y 90 embolization, she would benefit from repeat pre-Y 32 mapping angiography, and repeat shunt calculation to determine if she needs a split dose Y 90 therapy to cover both of the left hepatic lesions.  Also, the  lesions are hypervascular and I suspect we can flex dose her Y 90 treatments to prevent early stasis.  All questions were addressed.  She would like to proceed with left Y 90 embolization in the next few weeks.  Thank you for this interesting consult.  I greatly enjoyed meeting Laura [name] and look forward to participating in their care.  A copy of this report was sent to the requesting provider on this date.  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

## 2018-11-25 DIAGNOSIS — N939 Abnormal uterine and vaginal bleeding, unspecified: Secondary | ICD-10-CM | POA: Diagnosis not present

## 2018-11-25 DIAGNOSIS — R10811 Right upper quadrant abdominal tenderness: Secondary | ICD-10-CM | POA: Diagnosis not present

## 2018-11-25 DIAGNOSIS — K2970 Gastritis, unspecified, without bleeding: Secondary | ICD-10-CM | POA: Diagnosis not present

## 2018-11-25 DIAGNOSIS — I10 Essential (primary) hypertension: Secondary | ICD-10-CM | POA: Diagnosis not present

## 2018-12-03 ENCOUNTER — Other Ambulatory Visit (HOSPITAL_COMMUNITY): Payer: Self-pay | Admitting: Interventional Radiology

## 2018-12-03 DIAGNOSIS — C221 Intrahepatic bile duct carcinoma: Secondary | ICD-10-CM

## 2018-12-04 ENCOUNTER — Telehealth: Payer: Self-pay | Admitting: Oncology

## 2018-12-04 DIAGNOSIS — L929 Granulomatous disorder of the skin and subcutaneous tissue, unspecified: Secondary | ICD-10-CM | POA: Diagnosis not present

## 2018-12-04 NOTE — Telephone Encounter (Signed)
R/S appt due to GBS being out on PAL. Left vm for patient with updated appt.

## 2018-12-05 ENCOUNTER — Telehealth: Payer: Self-pay | Admitting: *Deleted

## 2018-12-05 ENCOUNTER — Telehealth: Payer: Self-pay | Admitting: Oncology

## 2018-12-05 NOTE — Telephone Encounter (Signed)
R/s appt per 12/12 sch message - pt is aware of appt change

## 2018-12-05 NOTE — Telephone Encounter (Signed)
Asking to move the 1/13 visit to few days after her Y-90 treatment on 12/16/18. OK per Dr. Benay Spice. Scheduling message sent as well as secure chat.

## 2018-12-05 NOTE — Telephone Encounter (Signed)
Returned patient's call/voicemail message.  Left message regarding appt dates/times in January.

## 2018-12-09 ENCOUNTER — Ambulatory Visit: Payer: PPO | Admitting: Podiatry

## 2018-12-16 ENCOUNTER — Ambulatory Visit (HOSPITAL_COMMUNITY): Payer: PPO

## 2018-12-24 ENCOUNTER — Other Ambulatory Visit: Payer: Self-pay | Admitting: *Deleted

## 2018-12-24 DIAGNOSIS — C221 Intrahepatic bile duct carcinoma: Secondary | ICD-10-CM

## 2018-12-24 MED ORDER — CLONAZEPAM 0.5 MG PO TABS: 0.2500 mg | ORAL_TABLET | Freq: Two times a day (BID) | ORAL | 0 refills | Status: DC | PRN

## 2018-12-26 ENCOUNTER — Other Ambulatory Visit: Payer: PPO

## 2019-01-02 ENCOUNTER — Other Ambulatory Visit: Payer: Self-pay | Admitting: Student

## 2019-01-02 ENCOUNTER — Ambulatory Visit: Payer: PPO | Admitting: Oncology

## 2019-01-02 ENCOUNTER — Other Ambulatory Visit: Payer: Self-pay | Admitting: Radiology

## 2019-01-03 ENCOUNTER — Encounter (HOSPITAL_COMMUNITY)
Admission: RE | Admit: 2019-01-03 | Discharge: 2019-01-03 | Disposition: A | Discharge status: Home / Self Care | Payer: PPO | Source: Ambulatory Visit | Attending: Interventional Radiology | Admitting: Interventional Radiology

## 2019-01-03 ENCOUNTER — Other Ambulatory Visit: Payer: Self-pay

## 2019-01-03 ENCOUNTER — Other Ambulatory Visit (HOSPITAL_COMMUNITY): Payer: Self-pay | Admitting: Interventional Radiology

## 2019-01-03 ENCOUNTER — Encounter (HOSPITAL_COMMUNITY): Payer: Self-pay

## 2019-01-03 ENCOUNTER — Ambulatory Visit (HOSPITAL_COMMUNITY)
Admission: RE | Admit: 2019-01-03 | Discharge: 2019-01-03 | Disposition: A | Discharge status: Home / Self Care | Payer: PPO | Source: Ambulatory Visit | Attending: Interventional Radiology | Admitting: Interventional Radiology

## 2019-01-03 DIAGNOSIS — I10 Essential (primary) hypertension: Secondary | ICD-10-CM | POA: Diagnosis not present

## 2019-01-03 DIAGNOSIS — K589 Irritable bowel syndrome without diarrhea: Secondary | ICD-10-CM | POA: Diagnosis not present

## 2019-01-03 DIAGNOSIS — Z7982 Long term (current) use of aspirin: Secondary | ICD-10-CM | POA: Insufficient documentation

## 2019-01-03 DIAGNOSIS — E7800 Pure hypercholesterolemia, unspecified: Secondary | ICD-10-CM | POA: Diagnosis not present

## 2019-01-03 DIAGNOSIS — C221 Intrahepatic bile duct carcinoma: Secondary | ICD-10-CM

## 2019-01-03 DIAGNOSIS — Z801 Family history of malignant neoplasm of trachea, bronchus and lung: Secondary | ICD-10-CM | POA: Diagnosis not present

## 2019-01-03 DIAGNOSIS — Z90710 Acquired absence of both cervix and uterus: Secondary | ICD-10-CM | POA: Insufficient documentation

## 2019-01-03 DIAGNOSIS — Z887 Allergy status to serum and vaccine status: Secondary | ICD-10-CM | POA: Diagnosis not present

## 2019-01-03 DIAGNOSIS — K219 Gastro-esophageal reflux disease without esophagitis: Secondary | ICD-10-CM | POA: Diagnosis not present

## 2019-01-03 DIAGNOSIS — Z8249 Family history of ischemic heart disease and other diseases of the circulatory system: Secondary | ICD-10-CM | POA: Diagnosis not present

## 2019-01-03 DIAGNOSIS — Z885 Allergy status to narcotic agent status: Secondary | ICD-10-CM | POA: Diagnosis not present

## 2019-01-03 DIAGNOSIS — R7303 Prediabetes: Secondary | ICD-10-CM | POA: Insufficient documentation

## 2019-01-03 DIAGNOSIS — Z803 Family history of malignant neoplasm of breast: Secondary | ICD-10-CM | POA: Diagnosis not present

## 2019-01-03 DIAGNOSIS — Z79899 Other long term (current) drug therapy: Secondary | ICD-10-CM | POA: Diagnosis not present

## 2019-01-03 HISTORY — PX: IR ANGIOGRAM SELECTIVE EACH ADDITIONAL VESSEL: IMG667

## 2019-01-03 HISTORY — PX: IR ANGIOGRAM VISCERAL SELECTIVE: IMG657

## 2019-01-03 HISTORY — PX: IR US GUIDE VASC ACCESS RIGHT: IMG2390

## 2019-01-03 LAB — PROTIME-INR
INR: 0.94
Prothrombin Time: 12.5 seconds (ref 11.4–15.2)

## 2019-01-03 LAB — CBC
HCT: 32.3 % — ABNORMAL LOW (ref 36.0–46.0)
Hemoglobin: 9.8 g/dL — ABNORMAL LOW (ref 12.0–15.0)
MCH: 24.1 pg — ABNORMAL LOW (ref 26.0–34.0)
MCHC: 30.3 g/dL (ref 30.0–36.0)
MCV: 79.4 fL — ABNORMAL LOW (ref 80.0–100.0)
Platelets: 258 10*3/uL (ref 150–400)
RBC: 4.07 MIL/uL (ref 3.87–5.11)
RDW: 14.9 % (ref 11.5–15.5)
WBC: 3.4 10*3/uL — ABNORMAL LOW (ref 4.0–10.5)
nRBC: 0 % (ref 0.0–0.2)

## 2019-01-03 LAB — COMPREHENSIVE METABOLIC PANEL
ALT: 14 U/L (ref 0–44)
AST: 21 U/L (ref 15–41)
Albumin: 4.4 g/dL (ref 3.5–5.0)
Alkaline Phosphatase: 111 U/L (ref 38–126)
Anion gap: 9 (ref 5–15)
BUN: 14 mg/dL (ref 8–23)
CO2: 26 mmol/L (ref 22–32)
Calcium: 9.6 mg/dL (ref 8.9–10.3)
Chloride: 104 mmol/L (ref 98–111)
Creatinine, Ser: 0.49 mg/dL (ref 0.44–1.00)
GFR calc Af Amer: >60 mL/min (ref >60)
GFR calc non Af Amer: >60 mL/min (ref >60)
Glucose, Bld: 119 mg/dL — ABNORMAL HIGH (ref 70–99)
Potassium: 3.7 mmol/L (ref 3.5–5.1)
Sodium: 139 mmol/L (ref 135–145)
Total Bilirubin: 0.5 mg/dL (ref 0.3–1.2)
Total Protein: 7.4 g/dL (ref 6.5–8.1)

## 2019-01-03 MED ORDER — LIDOCAINE HCL 1 % IJ SOLN
INTRAMUSCULAR | Status: AC
  Filled 2019-01-03: qty 20

## 2019-01-03 MED ORDER — MIDAZOLAM HCL 2 MG/2ML IJ SOLN
INTRAMUSCULAR | Status: AC
  Filled 2019-01-03: qty 4

## 2019-01-03 MED ORDER — IOHEXOL 300 MG/ML  SOLN
100.0000 mL | Freq: Once | INTRAMUSCULAR | Status: AC | PRN
  Administered 2019-01-03: 60 mL via INTRA_ARTERIAL

## 2019-01-03 MED ORDER — MIDAZOLAM HCL 2 MG/2ML IJ SOLN
INTRAMUSCULAR | Status: AC | PRN
  Administered 2019-01-03 (×2): 0.5 mg via INTRAVENOUS
  Administered 2019-01-03: 1 mg via INTRAVENOUS
  Administered 2019-01-03: 0.5 mg via INTRAVENOUS
  Administered 2019-01-03: 1 mg via INTRAVENOUS
  Administered 2019-01-03: 0.5 mg via INTRAVENOUS

## 2019-01-03 MED ORDER — TECHNETIUM TO 99M ALBUMIN AGGREGATED
5.4000 | Freq: Once | INTRAVENOUS | Status: AC | PRN
  Administered 2019-01-03: 5.4 via INTRAVENOUS

## 2019-01-03 MED ORDER — SODIUM CHLORIDE 0.9 % IV SOLN
INTRAVENOUS | Status: DC
  Administered 2019-01-03: 08:00:00 via INTRAVENOUS

## 2019-01-03 MED ORDER — LIDOCAINE HCL (PF) 1 % IJ SOLN
INTRAMUSCULAR | Status: AC | PRN
  Administered 2019-01-03: 10 mL via SUBCUTANEOUS

## 2019-01-03 MED ORDER — IOHEXOL 300 MG/ML  SOLN
100.0000 mL | Freq: Once | INTRAMUSCULAR | Status: AC | PRN
  Administered 2019-01-03: 25 mL via INTRA_ARTERIAL

## 2019-01-03 MED ORDER — IOHEXOL 300 MG/ML  SOLN
100.0000 mL | Freq: Once | INTRAMUSCULAR | Status: AC | PRN
  Administered 2019-01-03: 70 mL via INTRA_ARTERIAL

## 2019-01-03 MED ORDER — FENTANYL CITRATE (PF) 100 MCG/2ML IJ SOLN
INTRAMUSCULAR | Status: AC
  Filled 2019-01-03: qty 2

## 2019-01-03 MED ORDER — FENTANYL CITRATE (PF) 100 MCG/2ML IJ SOLN
INTRAMUSCULAR | Status: AC | PRN
  Administered 2019-01-03 (×4): 25 ug via INTRAVENOUS

## 2019-01-03 NOTE — Procedures (Signed)
Multifocal cholangca  S/p hepatic angios and repeat hepatic pulm shunt calculation  No comp Stable ebl min Full report in pacs

## 2019-01-03 NOTE — Discharge Instructions (Signed)
Femoral Site Care This sheet gives you information about how to care for yourself after your procedure. Your health care provider may also give you more specific instructions. If you have problems or questions, contact your health care provider. What can I expect after the procedure? After the procedure, it is common to have:  Bruising that usually fades within 1-2 weeks.  Tenderness at the site. Follow these instructions at home: Wound care  Follow instructions from your health care provider about how to take care of your insertion site. Make sure you: ? Wash your hands with soap and water before you change your bandage (dressing). If soap and water are not available, use hand sanitizer. ? Change your dressing as told by your health care provider. ? Leave stitches (sutures), skin glue, or adhesive strips in place. These skin closures may need to stay in place for 2 weeks or longer. If adhesive strip edges start to loosen and curl up, you may trim the loose edges. Do not remove adhesive strips completely unless your health care provider tells you to do that.  Do not take baths, swim, or use a hot tub until your health care provider approves.  You may shower 24-48 hours after the procedure or as told by your health care provider. ? Gently wash the site with plain soap and water. ? Pat the area dry with a clean towel. ? Do not rub the site. This may cause bleeding.  Do not apply powder or lotion to the site. Keep the site clean and dry.  Check your femoral site every day for signs of infection. Check for: ? Redness, swelling, or pain. ? Fluid or blood. ? Warmth. ? Pus or a bad smell. Activity  For the first 2-3 days after your procedure, or as long as directed: ? Avoid climbing stairs as much as possible. ? Do not squat.  Do not lift anything that is heavier than 10 lb (4.5 kg), or the limit that you are told, until your health care provider says that it is safe.  Rest as  directed. ? Avoid sitting for a long time without moving. Get up to take short walks every 1-2 hours.  Do not drive for 24 hours if you were given a medicine to help you relax (sedative). General instructions  Take over-the-counter and prescription medicines only as told by your health care provider.  Keep all follow-up visits as told by your health care provider. This is important. Contact a health care provider if you have:  A fever or chills.  You have redness, swelling, or pain around your insertion site. Get help right away if:  The catheter insertion area swells very fast.  You pass out.  You suddenly start to sweat or your skin gets clammy.  The catheter insertion area is bleeding, and the bleeding does not stop when you hold steady pressure on the area.  The area near or just beyond the catheter insertion site becomes pale, cool, tingly, or numb. These symptoms may represent a serious problem that is an emergency. Do not wait to see if the symptoms will go away. Get medical help right away. Call your local emergency services (911 in the [place] Do not drive yourself to the hospital. Summary  After the procedure, it is common to have bruising that usually fades within 1-2 weeks.  Check your femoral site every day for signs of infection.  Do not lift anything that is heavier than 10 lb (4.5  kg), or the limit that you are told, until your health care provider says that it is safe. This information is not intended to replace advice given to you by your health care provider. Make sure you discuss any questions you have with your health care provider. Document Released: 08/14/2014 Document Revised: 12/24/2017 Document Reviewed: 12/24/2017 Elsevier Interactive Patient Education  2019 Laura [name].   Moderate Conscious Sedation, Adult, Care After These instructions provide you with information about caring for yourself after your procedure. Your health care provider may also  give you more specific instructions. Your treatment has been planned according to current medical practices, but problems sometimes occur. Call your health care provider if you have any problems or questions after your procedure. What can I expect after the procedure? After your procedure, it is common:  To feel sleepy for several hours.  To feel clumsy and have poor balance for several hours.  To have poor judgment for several hours.  To vomit if you eat too soon. Follow these instructions at home: For at least 24 hours after the procedure:   Do not: ? Participate in activities where you could fall or become injured. ? Drive. ? Use heavy machinery. ? Drink alcohol. ? Take sleeping pills or medicines that cause drowsiness. ? Make important decisions or sign legal documents. ? Take care of children on your own.  Rest. Eating and drinking  Follow the diet recommended by your health care provider.  If you vomit: ? Drink water, juice, or soup when you can drink without vomiting. ? Make sure you have little or no nausea before eating solid foods. General instructions  Have a responsible adult stay with you until you are awake and alert.  Take over-the-counter and prescription medicines only as told by your health care provider.  If you smoke, do not smoke without supervision.  Keep all follow-up visits as told by your health care provider. This is important. Contact a health care provider if:  You keep feeling nauseous or you keep vomiting.  You feel light-headed.  You develop a rash.  You have a fever. Get help right away if:  You have trouble breathing. This information is not intended to replace advice given to you by your health care provider. Make sure you discuss any questions you have with your health care provider. Document Released: 10/01/2013 Document Revised: 05/15/2016 Document Reviewed: 04/01/2016 Elsevier Interactive Patient Education  2019 Laura [name] Radioembolization Discharge Instructions **For next appt.**  You have been given a radioactive material during your procedure.  While it is safe for you to be discharged home from the hospital, you need to proceed directly home.    Do not use public transportation, including air travel, lasting more than 2 hours for 1 week.  Avoid crowded public places for 1 week.  Adult visitors should try to avoid close contact with you for 1 week.    Children and pregnant females should not visit or have close contact with you for 1 week.  Items that you touch are not radioactive.  Do not sleep in the same bed as your partner for 1 week, and a condom should be used for sexual activity during the first 24 hours.  Your blood may be radioactive and caution should be used if any bleeding occurs during the recovery period.  Body fluids may be radioactive for 24 hours.  Wash your hands after voiding.  Men should sit to urinate.  Dispose of any soiled  materials (flush down toilet or place in trash at home) during the first day.  Drink 6 to 8 glasses of fluids per day for 5 days to hydrate yourself.  If you need to see a doctor during the first week, you must let them know that you were treated with yttrium-90 microspheres, and will be slightly radioactive.  They can call Interventional Radiology [phone] with any questions.

## 2019-01-03 NOTE — H&P (Signed)
Chief Complaint: Patient was seen in consultation today for pre-Y90  Referring Physician(s): Shick,Michael  Supervising Physician: Daryll Brod  Patient Status: [facility] - Out-pt  History of Present Illness: Laura [name] is a 78 y.o. female with a past medical history significant for HLD, HTN, GERD and hepatic adenocarcinoma, presumed cholangiocarcinoma followed by Dr. Benay Spice who presents today for pre-Y90 roadmapping. She is well known to IR due to previous right hepatic Y-90 radioembolization performed 07/19/2017 by Dr. Annamaria Boots. Last follow up with our office was 11/20/18 where follow up MR abdomen with and w/o contrast was performed showing interval development of 2.1 x 2.0 cm rim enhancing lesion in the lateral segment of the left liver, concerning for metastatic disease; new 8 mm hypervascular focus posterior parenchyma of the medial segment left liver also concerning for new metastatic disease, no substantial change in the posterior right hepatic mass with persistent central nodular enhancement and enhancement along the cranial margin; small hypervascular lesion in the segment of the left liver previously characterized as likely [place] is stable; no change in necrotic hepato duodenal ligament lymph node; no change cystic lesion head of pancreas. These images were reviewed during her appointment with Dr. Annamaria Boots and decision was made to pursue treatment of the new left hepatic lesions.   Patient report she feels fine, has no complaints. She is familiar with procedure and wishes to proceed today.   Past Medical History:  Diagnosis Date  . Cancer ([place])   . GERD (gastroesophageal reflux disease)   . Heart murmur   . High cholesterol   . Hypertension   . IBS (irritable bowel syndrome)   . PONV (postoperative nausea and vomiting)   . Pre-diabetes     Past Surgical History:  Procedure Laterality Date  . BREAST EXCISIONAL BIOPSY Right 1960   scar is not visible   . BREAST SURGERY     Benign  . CHOLECYSTECTOMY    . IR ANGIOGRAM SELECTIVE EACH ADDITIONAL VESSEL  07/05/2017  . IR ANGIOGRAM SELECTIVE EACH ADDITIONAL VESSEL  07/05/2017  . IR ANGIOGRAM SELECTIVE EACH ADDITIONAL VESSEL  07/05/2017  . IR ANGIOGRAM SELECTIVE EACH ADDITIONAL VESSEL  07/19/2017  . IR ANGIOGRAM VISCERAL SELECTIVE  07/05/2017  . IR ANGIOGRAM VISCERAL SELECTIVE  07/05/2017  . IR ANGIOGRAM VISCERAL SELECTIVE  07/19/2017  . IR EMBO ARTERIAL NOT HEMORR HEMANG INC GUIDE ROADMAPPING  07/05/2017  . IR EMBO TUMOR ORGAN ISCHEMIA INFARCT INC GUIDE ROADMAPPING  07/19/2017  . IR GENERIC HISTORICAL  09/27/2016   IR RADIOLOGIST EVAL & MGMT 09/27/2016 Greggory Keen, MD [facility] INTERV RAD  . IR GENERIC HISTORICAL  12/07/2016   IR RADIOLOGIST EVAL & MGMT 12/07/2016 Greggory Keen, MD [facility] INTERV RAD  . IR RADIOLOGIST EVAL & MGMT  03/08/2017  . IR RADIOLOGIST EVAL & MGMT  08/16/2017  . IR RADIOLOGIST EVAL & MGMT  10/09/2017  . IR RADIOLOGIST EVAL & MGMT  06/14/2017  . IR RADIOLOGIST EVAL & MGMT  01/08/2018  . IR RADIOLOGIST EVAL & MGMT  04/03/2018  . IR RADIOLOGIST EVAL & MGMT  07/03/2018  . IR RADIOLOGIST EVAL & MGMT  11/20/2018  . IR US GUIDE VASC ACCESS RIGHT  07/05/2017  . IR US GUIDE VASC ACCESS RIGHT  07/19/2017  . TOTAL ABDOMINAL HYSTERECTOMY      Allergies: Pneumococcal vaccines and Codeine  Medications: Prior to Admission medications   Medication Sig Start Date End Date Taking? Authorizing Provider  acetaminophen (TYLENOL) 500 MG tablet Take 1,000 mg by mouth every 6 (six)  hours as needed for mild pain.   Yes [provider]  amLODipine (NORVASC) 5 MG tablet Take 5 mg by mouth daily.  06/22/14  Yes [provider]  aspirin EC 81 MG tablet Take 81 mg by mouth daily.   Yes [provider]  Biotin 5000 MCG TABS Take 5,000 mcg by mouth daily.    Yes [provider]  Cholecalciferol (VITAMIN D3) 5000 units CAPS Take 5,000 Units by mouth daily.   Yes [provider]    clonazePAM (KLONOPIN) 0.5 MG tablet Take 0.5-1 tablets (0.25-0.5 mg total) by mouth 2 (two) times daily as needed for anxiety. Take 0.25mg s twice daily and 0.5mg s at bedtime, may take an additional 0.25mg s as needed for anxiety 12/24/18  Yes Ladell Pier, MD  doxazosin (CARDURA) 1 MG tablet Take 1 mg by mouth at bedtime.  06/22/14  Yes [provider]  Multiple Vitamins-Minerals (CENTRUM SILVER ADULT 50+ PO) Take 1 tablet by mouth.   Yes [provider]  omeprazole (PRILOSEC) 20 MG capsule Take 20 mg by mouth 2 (two) times daily. 06/25/17  Yes [provider]  ondansetron (ZOFRAN) 4 MG tablet Take 4 mg by mouth every 6 (six) hours as needed. for nausea 07/12/18  Yes [provider]  polyethylene glycol (MIRALAX / GLYCOLAX) packet Take 17 g by mouth daily as needed for mild constipation.   Yes [provider]  pravastatin (PRAVACHOL) 40 MG tablet Take 40 mg by mouth every evening.    Yes [provider]  tetrahydrozoline 0.05 % ophthalmic solution Place 1 drop into both eyes daily as needed (dry eye).    Yes [provider]  clindamycin (CLEOCIN) 2 % vaginal cream  08/29/18   [provider]  docusate sodium (COLACE) 50 MG capsule Take 50 mg by mouth 3 (three) times daily as needed for mild constipation.    [provider]  methocarbamol (ROBAXIN) 750 MG tablet Take 1 tablet by mouth 3 (three) times daily as needed for muscle spasms.  07/12/18   [provider]  prochlorperazine (COMPAZINE) 5 MG tablet Take 1 tablet (5 mg total) by mouth every 6 (six) hours as needed for nausea or vomiting. 08/03/17   Ladell Pier, MD     Family History  Problem Relation Age of Onset  . Alzheimer's disease Mother   . Alzheimer's disease Brother   . Alzheimer's disease Sister   . Breast cancer Sister   . CAD Brother 73       CABG  . Cancer Sister        Breast  . Cancer Brother        Lung    Social History    Socioeconomic History  . Marital status: Widowed    Spouse name: Lanae Boast  . Number of children: 3  . Years of education: Not on file  . Highest education level: Not on file  Occupational History  . Occupation: Retired  Scientific laboratory technician  . Financial resource strain: Not on file  . Food insecurity:    Worry: Not on file    Inability: Not on file  . Transportation needs:    Medical: Not on file    Non-medical: Not on file  Tobacco Use  . Smoking status: Never Smoker  . Smokeless tobacco: Never Used  Substance and Sexual Activity  . Alcohol use: No  . Drug use: No  . Sexual activity: Not on file  Lifestyle  . Physical activity:  Days per week: Not on file    Minutes per session: Not on file  . Stress: Not on file  Relationships  . Social connections:    Talks on phone: Not on file    Gets together: Not on file    Attends religious service: Not on file    Active member of club or organization: Not on file    Attends meetings of clubs or organizations: Not on file    Relationship status: Not on file  Other Topics Concern  . Not on file  Social History Narrative   Lives at home with husband, Lanae Boast who is in poor health (she is caregiver)   Has #3 daughters   Retired from working at [employer]   Has total of #7 siblings     Review of Systems: A 12 point ROS discussed and pertinent positives are indicated in the HPI above.  All other systems are negative.  Review of Systems  Constitutional: Negative for appetite change, chills, fatigue and fever.  Respiratory: Negative for cough and shortness of breath.   Cardiovascular: Negative for chest pain.  Gastrointestinal: Negative for abdominal pain, blood in stool, diarrhea, nausea and vomiting.  Genitourinary: Negative for dysuria and hematuria.  Neurological: Negative for dizziness, syncope, light-headedness and headaches.  Psychiatric/Behavioral: Negative for confusion.    Vital Signs: BP (!) 147/62   Pulse  70   Temp 98 F (36.7 C) (Oral)   Resp 16   SpO2 100%   Physical Exam Vitals signs reviewed.  Constitutional:      General: She is not in acute distress.    Appearance: Normal appearance.     Comments: Daughter at bedside.  HENT:     Head: Normocephalic.  Cardiovascular:     Rate and Rhythm: Normal rate.     Heart sounds: Murmur present.  Pulmonary:     Effort: Pulmonary effort is normal.     Breath sounds: Normal breath sounds.  Abdominal:     General: There is no distension.     Palpations: Abdomen is soft.     Tenderness: There is no abdominal tenderness.  Skin:    General: Skin is warm and dry.  Neurological:     Mental Status: She is alert and oriented to person, place, and time.  Psychiatric:        Mood and Affect: Mood normal.        Behavior: Behavior normal.        Thought Content: Thought content normal.        Judgment: Judgment normal.      MD Evaluation Airway: WNL(Top and bottom dentures) Heart: WNL Abdomen: WNL Chest/ Lungs: WNL ASA  Classification: 3 Mallampati/Airway Score: One   Imaging: No results found.  Labs:  CBC: Recent Labs    08/24/18 1307 01/03/19 0823  WBC 8.1 3.4*  HGB 10.2* 9.8*  HCT 32.7* 32.3*  PLT 220 258    COAGS: Recent Labs    01/03/19 0823  INR 0.94    BMP: Recent Labs    04/03/18 0831 07/03/18 0700 08/24/18 1307 11/20/18 0706  NA  --   --  137  --   K  --   --  3.3*  --   CL  --   --  104  --   CO2  --   --  24  --   GLUCOSE  --   --  156*  --   BUN  --   --  11  --   CALCIUM  --   --  9.2  --   CREATININE 0.60 0.60 0.59 0.60  GFRNONAA  --   --  >60  --   GFRAA  --   --  >60  --     LIVER FUNCTION TESTS: No results for input(s): BILITOT, AST, ALT, ALKPHOS, PROT, ALBUMIN in the last 8760 hours.  TUMOR MARKERS: No results for input(s): AFPTM, CEA, CA199, CHROMGRNA in the last 8760 hours.  Assessment and Plan:  Patient with known hepatic adenocarcinoma previously treated with right  hepatic Y-90 radioembolization on 07/19/17 with Dr. Annamaria Boots who presents today for pre-Y90 roadmapping of new left hepatic lesion seen on follow up MRI 11/20/18.   Patient has been NPO since 9 pm last night, she does not take blood thinning medications. Afebrile, WBC 3.4, hgb 9.8, plt 258, INR 0.94, creatinine 0.49.  Risks and benefits discussed with the patient including, but not limited to bleeding, infection, vascular injury, post procedural pain, nausea, vomiting and fatigue, contrast induced renal failure, liver failure, radiation injury to the bowel, radiation induced cholecystitis, neutropenia and possible need for additional procedures.  All of the patient's questions were answered, patient is agreeable to proceed.  Consent signed and in chart.  Thank you for this interesting consult.  I greatly enjoyed meeting RUTHIA PERSON and look forward to participating in their care.  A copy of this report was sent to the requesting provider on this date.  Electronically Signed: Joaquim Nam, PA-C 01/03/2019, 8:49 AM   I spent a total of  25 Minutes in face to face in clinical consultation, greater than 50% of which was counseling/coordinating care for pre-Y90.

## 2019-01-06 ENCOUNTER — Ambulatory Visit: Payer: PPO | Admitting: Oncology

## 2019-01-08 ENCOUNTER — Encounter: Payer: Self-pay | Admitting: *Deleted

## 2019-01-08 ENCOUNTER — Other Ambulatory Visit: Payer: Self-pay | Admitting: *Deleted

## 2019-01-08 NOTE — Patient Outreach (Signed)
[facility] High Risk Patient Screening call. I was able to speak with Laura [name] today. Explained [employer] services. Pt reports her daughter is a Marine scientist and she is covered. Will send successful outreach letter.  Eulah Pont. Myrtie Neither, MSN, [facility] Gerontological Nurse Practitioner [facility] Care Management [phone]

## 2019-01-15 ENCOUNTER — Other Ambulatory Visit: Payer: Self-pay | Admitting: Physician Assistant

## 2019-01-16 ENCOUNTER — Encounter (HOSPITAL_COMMUNITY)
Admission: RE | Admit: 2019-01-16 | Discharge: 2019-01-16 | Disposition: A | Discharge status: Home / Self Care | Payer: PPO | Source: Ambulatory Visit | Attending: Interventional Radiology | Admitting: Interventional Radiology

## 2019-01-16 ENCOUNTER — Ambulatory Visit (HOSPITAL_COMMUNITY)
Admission: RE | Admit: 2019-01-16 | Discharge: 2019-01-16 | Disposition: A | Discharge status: Home / Self Care | Payer: PPO | Source: Ambulatory Visit | Attending: Interventional Radiology | Admitting: Interventional Radiology

## 2019-01-16 ENCOUNTER — Encounter (HOSPITAL_COMMUNITY): Payer: Self-pay

## 2019-01-16 ENCOUNTER — Other Ambulatory Visit: Payer: Self-pay

## 2019-01-16 ENCOUNTER — Other Ambulatory Visit (HOSPITAL_COMMUNITY): Payer: Self-pay | Admitting: Interventional Radiology

## 2019-01-16 DIAGNOSIS — R7303 Prediabetes: Secondary | ICD-10-CM | POA: Insufficient documentation

## 2019-01-16 DIAGNOSIS — C221 Intrahepatic bile duct carcinoma: Secondary | ICD-10-CM | POA: Diagnosis not present

## 2019-01-16 DIAGNOSIS — Z90710 Acquired absence of both cervix and uterus: Secondary | ICD-10-CM | POA: Diagnosis not present

## 2019-01-16 DIAGNOSIS — Z7982 Long term (current) use of aspirin: Secondary | ICD-10-CM | POA: Insufficient documentation

## 2019-01-16 DIAGNOSIS — Z8249 Family history of ischemic heart disease and other diseases of the circulatory system: Secondary | ICD-10-CM | POA: Insufficient documentation

## 2019-01-16 DIAGNOSIS — Z79899 Other long term (current) drug therapy: Secondary | ICD-10-CM | POA: Insufficient documentation

## 2019-01-16 DIAGNOSIS — E7800 Pure hypercholesterolemia, unspecified: Secondary | ICD-10-CM | POA: Insufficient documentation

## 2019-01-16 DIAGNOSIS — Z801 Family history of malignant neoplasm of trachea, bronchus and lung: Secondary | ICD-10-CM | POA: Insufficient documentation

## 2019-01-16 DIAGNOSIS — I10 Essential (primary) hypertension: Secondary | ICD-10-CM | POA: Insufficient documentation

## 2019-01-16 DIAGNOSIS — Z9049 Acquired absence of other specified parts of digestive tract: Secondary | ICD-10-CM | POA: Insufficient documentation

## 2019-01-16 DIAGNOSIS — Z803 Family history of malignant neoplasm of breast: Secondary | ICD-10-CM | POA: Insufficient documentation

## 2019-01-16 DIAGNOSIS — K589 Irritable bowel syndrome without diarrhea: Secondary | ICD-10-CM | POA: Diagnosis not present

## 2019-01-16 DIAGNOSIS — K219 Gastro-esophageal reflux disease without esophagitis: Secondary | ICD-10-CM | POA: Diagnosis not present

## 2019-01-16 DIAGNOSIS — Z887 Allergy status to serum and vaccine status: Secondary | ICD-10-CM | POA: Insufficient documentation

## 2019-01-16 DIAGNOSIS — Z885 Allergy status to narcotic agent status: Secondary | ICD-10-CM | POA: Diagnosis not present

## 2019-01-16 HISTORY — PX: IR ANGIOGRAM SELECTIVE EACH ADDITIONAL VESSEL: IMG667

## 2019-01-16 HISTORY — PX: IR US GUIDE VASC ACCESS RIGHT: IMG2390

## 2019-01-16 HISTORY — PX: IR ANGIOGRAM VISCERAL SELECTIVE: IMG657

## 2019-01-16 HISTORY — PX: IR EMBO TUMOR ORGAN ISCHEMIA INFARCT INC GUIDE ROADMAPPING: IMG5449

## 2019-01-16 LAB — PROTIME-INR
INR: 0.93
Prothrombin Time: 12.4 seconds (ref 11.4–15.2)

## 2019-01-16 LAB — COMPREHENSIVE METABOLIC PANEL
ALT: 14 U/L (ref 0–44)
AST: 24 U/L (ref 15–41)
Albumin: 4.1 g/dL (ref 3.5–5.0)
Alkaline Phosphatase: 106 U/L (ref 38–126)
Anion gap: 10 (ref 5–15)
BUN: 11 mg/dL (ref 8–23)
CO2: 25 mmol/L (ref 22–32)
Calcium: 9.5 mg/dL (ref 8.9–10.3)
Chloride: 103 mmol/L (ref 98–111)
Creatinine, Ser: 0.46 mg/dL (ref 0.44–1.00)
GFR calc Af Amer: >60 mL/min (ref >60)
GFR calc non Af Amer: >60 mL/min (ref >60)
Glucose, Bld: 127 mg/dL — ABNORMAL HIGH (ref 70–99)
Potassium: 3.9 mmol/L (ref 3.5–5.1)
Sodium: 138 mmol/L (ref 135–145)
Total Bilirubin: 0.7 mg/dL (ref 0.3–1.2)
Total Protein: 7.3 g/dL (ref 6.5–8.1)

## 2019-01-16 LAB — CBC WITH DIFFERENTIAL/PLATELET
Abs Immature Granulocytes: 0.01 10*3/uL (ref 0.00–0.07)
Basophils Absolute: 0 10*3/uL (ref 0.0–0.1)
Basophils Relative: 1 %
Eosinophils Absolute: 0 10*3/uL (ref 0.0–0.5)
Eosinophils Relative: 1 %
HCT: 32.6 % — ABNORMAL LOW (ref 36.0–46.0)
Hemoglobin: 9.7 g/dL — ABNORMAL LOW (ref 12.0–15.0)
Immature Granulocytes: 0 %
Lymphocytes Relative: 23 %
Lymphs Abs: 0.7 10*3/uL (ref 0.7–4.0)
MCH: 24 pg — ABNORMAL LOW (ref 26.0–34.0)
MCHC: 29.8 g/dL — ABNORMAL LOW (ref 30.0–36.0)
MCV: 80.5 fL (ref 80.0–100.0)
Monocytes Absolute: 0.4 10*3/uL (ref 0.1–1.0)
Monocytes Relative: 13 %
Neutro Abs: 1.9 10*3/uL (ref 1.7–7.7)
Neutrophils Relative %: 62 %
Platelets: 246 10*3/uL (ref 150–400)
RBC: 4.05 MIL/uL (ref 3.87–5.11)
RDW: 14.9 % (ref 11.5–15.5)
WBC: 3.2 10*3/uL — ABNORMAL LOW (ref 4.0–10.5)
nRBC: 0 % (ref 0.0–0.2)

## 2019-01-16 MED ORDER — CLONAZEPAM 0.5 MG PO TBDP
0.5000 mg | ORAL_TABLET | Freq: Once | ORAL | Status: AC
  Administered 2019-01-16: 0.5 mg via ORAL
  Filled 2019-01-16: qty 1

## 2019-01-16 MED ORDER — PANTOPRAZOLE SODIUM 40 MG IV SOLR
40.0000 mg | Freq: Once | INTRAVENOUS | Status: AC
  Administered 2019-01-16: 40 mg via INTRAVENOUS

## 2019-01-16 MED ORDER — MIDAZOLAM HCL 2 MG/2ML IJ SOLN
INTRAMUSCULAR | Status: AC
  Filled 2019-01-16: qty 2

## 2019-01-16 MED ORDER — LIDOCAINE HCL 1 % IJ SOLN
INTRAMUSCULAR | Status: AC
  Filled 2019-01-16: qty 20

## 2019-01-16 MED ORDER — YTTRIUM 90 INJECTION: 12.2000 | INJECTION | Freq: Once | INTRAVENOUS | Status: DC

## 2019-01-16 MED ORDER — FENTANYL CITRATE (PF) 100 MCG/2ML IJ SOLN
INTRAMUSCULAR | Status: AC | PRN
  Administered 2019-01-16 (×4): 50 ug via INTRAVENOUS

## 2019-01-16 MED ORDER — FENTANYL CITRATE (PF) 100 MCG/2ML IJ SOLN
INTRAMUSCULAR | Status: AC
  Filled 2019-01-16: qty 2

## 2019-01-16 MED ORDER — IOHEXOL 300 MG/ML  SOLN
100.0000 mL | Freq: Once | INTRAMUSCULAR | Status: AC | PRN
  Administered 2019-01-16: 60 mL via INTRA_ARTERIAL

## 2019-01-16 MED ORDER — SODIUM CHLORIDE 0.9 % IV SOLN
INTRAVENOUS | Status: DC
  Administered 2019-01-16: 08:00:00 via INTRAVENOUS

## 2019-01-16 MED ORDER — AMLODIPINE BESYLATE 5 MG PO TABS
5.0000 mg | ORAL_TABLET | Freq: Once | ORAL | Status: AC
  Administered 2019-01-16: 5 mg via ORAL
  Filled 2019-01-16: qty 1

## 2019-01-16 MED ORDER — MIDAZOLAM HCL 2 MG/2ML IJ SOLN
INTRAMUSCULAR | Status: AC
  Administered 2019-01-16: 1 mg via INTRAVENOUS
  Filled 2019-01-16: qty 2

## 2019-01-16 MED ORDER — IOPAMIDOL (ISOVUE-300) INJECTION 61%
INTRAVENOUS | Status: AC
  Administered 2019-01-16: 25 mL via INTRAVENOUS
  Filled 2019-01-16: qty 100

## 2019-01-16 MED ORDER — IOPAMIDOL (ISOVUE-300) INJECTION 61%
50.0000 mL | Freq: Once | INTRAVENOUS | Status: AC | PRN
  Administered 2019-01-16: 25 mL via INTRAVENOUS

## 2019-01-16 MED ORDER — PIPERACILLIN-TAZOBACTAM 3.375 G IVPB
INTRAVENOUS | Status: AC
  Filled 2019-01-16: qty 50

## 2019-01-16 MED ORDER — PANTOPRAZOLE SODIUM 40 MG IV SOLR
INTRAVENOUS | Status: AC
  Filled 2019-01-16: qty 40

## 2019-01-16 MED ORDER — PIPERACILLIN-TAZOBACTAM 3.375 G IVPB 30 MIN
3.3750 g | Freq: Once | INTRAVENOUS | Status: AC
  Administered 2019-01-16: 3.375 g via INTRAVENOUS
  Filled 2019-01-16: qty 50

## 2019-01-16 MED ORDER — MIDAZOLAM HCL 2 MG/2ML IJ SOLN
INTRAMUSCULAR | Status: AC | PRN
  Administered 2019-01-16 (×5): 1 mg via INTRAVENOUS

## 2019-01-16 MED ORDER — MIDAZOLAM HCL 2 MG/2ML IJ SOLN
INTRAMUSCULAR | Status: AC
  Filled 2019-01-16: qty 4

## 2019-01-16 MED ORDER — DEXAMETHASONE SODIUM PHOSPHATE 10 MG/ML IJ SOLN
20.0000 mg | Freq: Once | INTRAMUSCULAR | Status: AC
  Administered 2019-01-16: 20 mg via INTRAVENOUS
  Filled 2019-01-16: qty 2

## 2019-01-16 MED ORDER — MIDAZOLAM HCL 2 MG/2ML IJ SOLN
1.0000 mg | Freq: Once | INTRAMUSCULAR | Status: AC
  Administered 2019-01-16: 1 mg via INTRAVENOUS

## 2019-01-16 MED ORDER — SODIUM CHLORIDE 0.9 % IV SOLN
8.0000 mg | Freq: Once | INTRAVENOUS | Status: AC
  Administered 2019-01-16: 8 mg via INTRAVENOUS
  Filled 2019-01-16: qty 4

## 2019-01-16 NOTE — Discharge Instructions (Addendum)
Moderate Conscious Sedation, Adult, Care After These instructions provide you with information about caring for yourself after your procedure. Your health care provider may also give you more specific instructions. Your treatment has been planned according to current medical practices, but problems sometimes occur. Call your health care provider if you have any problems or questions after your procedure. What can I expect after the procedure? After your procedure, it is common:  To feel sleepy for several hours.  To feel clumsy and have poor balance for several hours.  To have poor judgment for several hours.  To vomit if you eat too soon. Follow these instructions at home: For at least 24 hours after the procedure:   Do not: ? Participate in activities where you could fall or become injured. ? Drive. ? Use heavy machinery. ? Drink alcohol. ? Take sleeping pills or medicines that cause drowsiness. ? Make important decisions or sign legal documents. ? Take care of children on your own.  Rest. Eating and drinking  Follow the diet recommended by your health care provider.  If you vomit: ? Drink water, juice, or soup when you can drink without vomiting. ? Make sure you have little or no nausea before eating solid foods. General instructions  Have a responsible adult stay with you until you are awake and alert.  Take over-the-counter and prescription medicines only as told by your health care provider.  If you smoke, do not smoke without supervision.  Keep all follow-up visits as told by your health care provider. This is important. Contact a health care provider if:  You keep feeling nauseous or you keep vomiting.  You feel light-headed.  You develop a rash.  You have a fever. Get help right away if:  You have trouble breathing. This information is not intended to replace advice given to you by your health care provider. Make sure you discuss any questions you have  with your health care provider. Document Released: 10/01/2013 Document Revised: 05/15/2016 Document Reviewed: 04/01/2016 Elsevier Interactive Patient Education  2019 [place] Radioembolization Discharge Instructions  You have been given a radioactive material during your procedure.  While it is safe for you to be discharged home from the hospital, you need to proceed directly home.    Do not use public transportation, including air travel, lasting more than 2 hours for 1 week.  Avoid crowded public places for 1 week.  Adult visitors should try to avoid close contact with you for 1 week.    Children and pregnant females should not visit or have close contact with you for 1 week.  Items that you touch are not radioactive.  Do not sleep in the same bed as your partner for 1 week, and a condom should be used for sexual activity during the first 24 hours.  Your blood may be radioactive and caution should be used if any bleeding occurs during the recovery period.  Body fluids may be radioactive for 24 hours.  Wash your hands after voiding.  Men should sit to urinate.  Dispose of any soiled materials (flush down toilet or place in trash at home) during the first day.  Drink 6 to 8 glasses of fluids per day for 5 days to hydrate yourself.  If you need to see a doctor during the first week, you must let them know that you were treated with yttrium-90 microspheres, and will be slightly radioactive.  They can call Interventional Radiology [phone] with any questions.  Hepatic Artery Radioembolization, Care After This sheet gives you information about how to care for yourself after your procedure. Your health care provider may also give you more specific instructions. If you have problems or questions, contact your health care provider. What can I expect after the procedure? After the procedure, it is common to have:  A slight fever for 1-2 weeks. If your fever gets worse,  tell your health care provider.  Fatigue.  Loss of appetite. This should gradually improve after about 1 week.  Abdominal pain on your right side.  Soreness and tenderness in your groin area where the needle and catheter were placed (puncture site). Follow these instructions at home:  Puncture site care  Follow instructions from your health care provider about how to take care of the puncture site. Make sure you: ? Wash your hands with soap and water before you change your bandage (dressing). If soap and water are not available, use hand sanitizer. ? Change your dressing as told by your health care provider. ? Leave stitches (sutures), skin glue, or adhesive strips in place. These skin closures may need to stay in place for 2 weeks or longer. If adhesive strip edges start to loosen and curl up, you may trim the loose edges. Do not remove adhesive strips completely unless your health care provider tells you to do that.  Check your puncture site every day for signs of infection. Check for: ? More redness, swelling, or pain. ? More fluid or blood. ? Warmth. ? Pus or a bad smell. Activity  Rest and return to your normal activities as told by your health care provider. Ask your health care provider what activities are safe for you.  Do not drive for 24 hours after the procedure if you were given a medicine to help you relax (sedative).  Do not lift anything that is heavier than 10 lb (4.5 kg) until your health care provider says that it is safe. Medicines  Take over-the-counter and prescription medicines only as told by your health care provider.  Do not drive or use heavy machinery while taking prescription pain medicine. Radiation precautions  For up to a week after your procedure, there will be a small amount of radioactivity near your liver. This is not especially dangerous to other people. However, you should follow these precautions for 7 days: ? Do not come in close contact  with people. ? Do not sleep in the same bed as someone else. ? Do not hold children or babies. ? Do not have contact with pregnant women. General instructions  To prevent or treat constipation while you are taking prescription pain medicine, your health care provider may recommend that you: ? Drink enough fluid to keep your urine clear or pale yellow. ? Take over-the-counter or prescription medicines. ? Eat foods that are high in fiber, such as fresh fruits and vegetables, whole grains, and beans. ? Limit foods that are high in fat and processed sugars, such as fried and sweet foods.  Eat frequent small meals until your appetite returns. Follow instructions from your health care provider about eating or drinking restrictions.  Do not take baths, swim, or use a hot tub until your health care provider approves. You may take showers. Wash your puncture site with mild soap and water and pat the area dry.  Wear compression stockings as told by your health care provider. These stockings help to prevent blood clots and reduce swelling in your legs.  Keep all follow-up visits  as told by your health care provider. This is important. You may need to have blood tests and imaging tests done. Contact a health care provider if:  You have more redness, swelling, or pain around your puncture site.  You have more fluid or blood coming from your puncture site.  Your puncture site feels warm to the touch.  You have pus or a bad smell coming from your puncture site.  You have pain that: ? Gets worse. ? Does not get better with medicine. ? Feels like very bad heartburn. ? Is in the middle of your abdomen, above your belly button.  Your skin and the white parts of your eyes turn yellow (jaundice).  The color of your urine changes to dark brown.  The color of your stool changes to light yellow.  Your abdominal measurement (girth) increases in a short period of time.  You gain more than 5 lb (2.3  kg) in a short period of time. Get help right away if:  You have a fever that lasts longer than 2 weeks or is higher than what your health care provider told you to expect.  You develop any of the following in your legs: ? Pain. ? Swelling. ? Skin that is cold or pale or turns blue.  You have chest pain.  You have blood in your vomit, saliva, or stool.  You have trouble breathing. This information is not intended to replace advice given to you by your health care provider. Make sure you discuss any questions you have with your health care provider. Document Released: 12/16/2013 Document Revised: 09/09/2016 Document Reviewed: 09/09/2016 Elsevier Interactive Patient Education  [employer].

## 2019-01-16 NOTE — Procedures (Signed)
cholangioca  S/p LEFT HEP Y90  NO COMP STABLE EBL MIN FULL REPORT IN PACS

## 2019-01-16 NOTE — H&P (Signed)
Chief Complaint: Patient was seen in consultation today for cholangiocarcinoma.  Referring Physician(s): Ladell Pier  Supervising Physician: Daryll Brod  Patient Status: [facility] - Out-pt  History of Present Illness: Laura [name] is a 78 y.o. female with a past medical history of hypertension, high cholesterol, GERD, IBS, cholangiocarcinoma, and pre-diabetes. She was unfortunately diagnosed with cholangiocarcinoma in 2017. Her cancer is managed by Dr. Benay Spice. She underwent hepatic microwave ablation followed by right hepatic Y90 radioembolization in 06/2017 by Dr. Annamaria Boots. During follow-up scans, she was unfortunately found to have 2 new enhancing lesions in the left hepatic lobe. She then underwent an image-guided pre-Y90 roadmapping 01/03/2019 by Dr. Annamaria Boots.  MR abdomen 11/20/2018: 1. Interval development of a 2.1 x 2.0 cm rim enhancing lesion in the lateral segment left liver, concerning for metastatic disease. 2. New 8 mm hypervascular focus posterior parenchyma of the medial segment left liver also concerning for new metastatic disease. 3. No substantial change in the posterior right hepatic mass with persistent central nodular enhancement and enhancement along the cranial margin. 4. Small hypervascular lesion in the lateral segment left liver, previously characterized as likely FNH, is stable in the interval. 5. No change necrotic hepato duodenal ligament lymph node. 6. No change cystic lesion head of pancreas.  IR requested by Dr. Benay Spice for possible image-guided mesenteric angiogram with possible left hepatic Y90 radioembolization. Patient awake and alert sitting in bed with no complaints at this time. Accompanied by daughter at bedside. Denies fever, chills, chest pain, dyspnea, abdominal pain, or headache.   Past Medical History:  Diagnosis Date  . Cancer ([place])   . GERD (gastroesophageal reflux disease)   . Heart murmur   . High cholesterol   . Hypertension   .  IBS (irritable bowel syndrome)   . PONV (postoperative nausea and vomiting)   . Pre-diabetes     Past Surgical History:  Procedure Laterality Date  . BREAST EXCISIONAL BIOPSY Right 1960   scar is not visible   . BREAST SURGERY     Benign  . CHOLECYSTECTOMY    . IR ANGIOGRAM SELECTIVE EACH ADDITIONAL VESSEL  07/05/2017  . IR ANGIOGRAM SELECTIVE EACH ADDITIONAL VESSEL  07/05/2017  . IR ANGIOGRAM SELECTIVE EACH ADDITIONAL VESSEL  07/05/2017  . IR ANGIOGRAM SELECTIVE EACH ADDITIONAL VESSEL  07/19/2017  . IR ANGIOGRAM SELECTIVE EACH ADDITIONAL VESSEL  01/03/2019  . IR ANGIOGRAM SELECTIVE EACH ADDITIONAL VESSEL  01/03/2019  . IR ANGIOGRAM SELECTIVE EACH ADDITIONAL VESSEL  01/03/2019  . IR ANGIOGRAM VISCERAL SELECTIVE  07/05/2017  . IR ANGIOGRAM VISCERAL SELECTIVE  07/05/2017  . IR ANGIOGRAM VISCERAL SELECTIVE  07/19/2017  . IR ANGIOGRAM VISCERAL SELECTIVE  01/03/2019  . IR EMBO ARTERIAL NOT HEMORR HEMANG INC GUIDE ROADMAPPING  07/05/2017  . IR EMBO TUMOR ORGAN ISCHEMIA INFARCT INC GUIDE ROADMAPPING  07/19/2017  . IR GENERIC HISTORICAL  09/27/2016   IR RADIOLOGIST EVAL & MGMT 09/27/2016 Greggory Keen, MD [facility] INTERV RAD  . IR GENERIC HISTORICAL  12/07/2016   IR RADIOLOGIST EVAL & MGMT 12/07/2016 Greggory Keen, MD [facility] INTERV RAD  . IR RADIOLOGIST EVAL & MGMT  03/08/2017  . IR RADIOLOGIST EVAL & MGMT  08/16/2017  . IR RADIOLOGIST EVAL & MGMT  10/09/2017  . IR RADIOLOGIST EVAL & MGMT  06/14/2017  . IR RADIOLOGIST EVAL & MGMT  01/08/2018  . IR RADIOLOGIST EVAL & MGMT  04/03/2018  . IR RADIOLOGIST EVAL & MGMT  07/03/2018  . IR RADIOLOGIST EVAL & MGMT  11/20/2018  .  IR US GUIDE VASC ACCESS RIGHT  07/05/2017  . IR US GUIDE VASC ACCESS RIGHT  07/19/2017  . IR US GUIDE VASC ACCESS RIGHT  01/03/2019  . TOTAL ABDOMINAL HYSTERECTOMY      Allergies: Pneumococcal vaccines and Codeine  Medications: Prior to Admission medications   Medication Sig Start Date End Date Taking? Authorizing Provider  acetaminophen  (TYLENOL) 500 MG tablet Take 1,000 mg by mouth every 6 (six) hours as needed for mild pain.   Yes [provider]  amLODipine (NORVASC) 5 MG tablet Take 5 mg by mouth daily.  06/22/14  Yes [provider]  aspirin EC 81 MG tablet Take 81 mg by mouth daily.   Yes [provider]  Biotin 5000 MCG TABS Take 5,000 mcg by mouth daily.    Yes [provider]  Cholecalciferol (VITAMIN D3) 5000 units CAPS Take 5,000 Units by mouth daily.   Yes [provider]  clindamycin (CLEOCIN) 2 % vaginal cream  08/29/18  Yes [provider]  clonazePAM (KLONOPIN) 0.5 MG tablet Take 0.5-1 tablets (0.25-0.5 mg total) by mouth 2 (two) times daily as needed for anxiety. Take 0.25mg s twice daily and 0.5mg s at bedtime, may take an additional 0.25mg s as needed for anxiety 12/24/18  Yes Ladell Pier, MD  docusate sodium (COLACE) 50 MG capsule Take 50 mg by mouth 3 (three) times daily as needed for mild constipation.   Yes [provider]  doxazosin (CARDURA) 1 MG tablet Take 1 mg by mouth at bedtime.  06/22/14  Yes [provider]  Multiple Vitamins-Minerals (CENTRUM SILVER ADULT 50+ PO) Take 1 tablet by mouth.   Yes [provider]  omeprazole (PRILOSEC) 20 MG capsule Take 20 mg by mouth 2 (two) times daily. 06/25/17  Yes [provider]  ondansetron (ZOFRAN) 4 MG tablet Take 4 mg by mouth every 6 (six) hours as needed. for nausea 07/12/18  Yes [provider]  polyethylene glycol (MIRALAX / GLYCOLAX) packet Take 17 g by mouth daily as needed for mild constipation.   Yes [provider]  pravastatin (PRAVACHOL) 40 MG tablet Take 40 mg by mouth every evening.    Yes [provider]  tetrahydrozoline 0.05 % ophthalmic solution Place 1 drop into both eyes daily as needed (dry eye).    Yes [provider]  methocarbamol (ROBAXIN) 750 MG tablet Take 1 tablet by mouth 3 (three) times daily as needed for muscle  spasms.  07/12/18   [provider]  prochlorperazine (COMPAZINE) 5 MG tablet Take 1 tablet (5 mg total) by mouth every 6 (six) hours as needed for nausea or vomiting. 08/03/17   Ladell Pier, MD     Family History  Problem Relation Age of Onset  . Alzheimer's disease Mother   . Alzheimer's disease Brother   . Alzheimer's disease Sister   . Breast cancer Sister   . CAD Brother 43       CABG  . Cancer Sister        Breast  . Cancer Brother        Lung    Social History   Socioeconomic History  . Marital status: Widowed    Spouse name: Lanae Boast  . Number of children: 3  . Years of education: Not on file  . Highest education level: Not on file  Occupational History  . Occupation: Retired  Scientific laboratory technician  . Financial resource strain: Not on file  . Food insecurity:    Worry:  Not on file    Inability: Not on file  . Transportation needs:    Medical: Not on file    Non-medical: Not on file  Tobacco Use  . Smoking status: Never Smoker  . Smokeless tobacco: Never Used  Substance and Sexual Activity  . Alcohol use: No  . Drug use: No  . Sexual activity: Not on file  Lifestyle  . Physical activity:    Days per week: Not on file    Minutes per session: Not on file  . Stress: Not on file  Relationships  . Social connections:    Talks on phone: Not on file    Gets together: Not on file    Attends religious service: Not on file    Active member of club or organization: Not on file    Attends meetings of clubs or organizations: Not on file    Relationship status: Not on file  Other Topics Concern  . Not on file  Social History Narrative   Lives at home with husband, Lanae Boast who is in poor health (she is caregiver)   Has #3 daughters   Retired from working at [employer]   Has total of #7 siblings     Review of Systems: A 12 point ROS discussed and pertinent positives are indicated in the HPI above.  All other systems are negative.  Review of Systems    Constitutional: Negative for chills and fever.  Respiratory: Negative for shortness of breath and wheezing.   Cardiovascular: Negative for chest pain and palpitations.  Gastrointestinal: Negative for abdominal pain.  Neurological: Negative for headaches.  Psychiatric/Behavioral: Negative for behavioral problems and confusion.    Vital Signs: There were no vitals taken for this visit.  Physical Exam Vitals signs and nursing note reviewed.  Constitutional:      General: She is not in acute distress.    Appearance: Normal appearance.  Cardiovascular:     Rate and Rhythm: Normal rate and regular rhythm.     Heart sounds: Normal heart sounds. No murmur.  Pulmonary:     Effort: Pulmonary effort is normal. No respiratory distress.     Breath sounds: Normal breath sounds. No wheezing.  Skin:    General: Skin is warm and dry.  Neurological:     Mental Status: She is alert and oriented to person, place, and time.  Psychiatric:        Mood and Affect: Mood normal.        Behavior: Behavior normal.        Thought Content: Thought content normal.        Judgment: Judgment normal.      MD Evaluation Airway: WNL Heart: WNL Abdomen: WNL Chest/ Lungs: WNL ASA  Classification: 3 Mallampati/Airway Score: One   Imaging: Nm Liver Img Spect  Result Date: 01/03/2019 CLINICAL DATA:  Recurrent multifocal cholangiocarcinoma. Prior percutaneous ablation of lesion in the RIGHT hepatic lobe as well as yttrium 90 micro sphere radioembolization to the RIGHT hepatic lobe (69.6 millicuries on 29/52/8413). Lesion is now in the LEFT hepatic lobe and caudate. EXAM: NUCLEAR MEDICINE LIVER SCAN; ULTRASOUND MISCELLANEOUS SOFT TISSUE TECHNIQUE: Abdominal images were obtained in multiple projections after intrahepatic arterial injection of radiopharmaceutical. SPECT imaging was performed. Lung shunt calculation was performed. RADIOPHARMACEUTICALS:  5.78millicurie MAA TECHNETIUM TO 38M ALBUMIN AGGREGATED  COMPARISON:  MRI 11/20/2018,, MRI 06/11/2017 FINDINGS: The injected microaggregated albumin localizes within the liver. No evidence of activity within the stomach, duodenum, or bowel. Calculated shunt fraction  to the lungs equals 3% IMPRESSION: 1. No significant extrahepatic radiotracer activity following intrahepatic arterial injection of MAA. 2. Lung shunt fraction equals 3% Electronically Signed   By: Suzy Bouchard M.D.   On: 01/03/2019 15:07   Ir Angiogram Visceral Selective  Result Date: 01/03/2019 INDICATION: Multifocal cholangiocarcinoma EXAM: ULTRASOUND GUIDANCE FOR VASCULAR ACCESS CELIAC, COMMON HEPATIC, LEFT HEPATIC, AND RIGHT HEPATIC ARTERY CATHETERIZATIONS AND ANGIOGRAMS LEFT HEPATIC TECHNETIUM MAA INJECTION FOR HEPATIC PULMONARY SHUNT CALCULATION MEDICATIONS: 1% lidocaine local. ANESTHESIA/SEDATION: Moderate (conscious) sedation was employed during this procedure. A total of Versed 4.0 mg and Fentanyl 100 mcg was administered intravenously. Moderate Sedation Time: 69 minutes. The patient's level of consciousness and vital signs were monitored continuously by radiology nursing throughout the procedure under my direct supervision. CONTRAST:  155 cc Omnipaque 300 FLUOROSCOPY TIME:  Fluoroscopy Time: 7 minutes 18 seconds (736 mGy). COMPLICATIONS: None immediate. PROCEDURE: Informed consent was obtained from the patient following explanation of the procedure, risks, benefits and alternatives. The patient understands, agrees and consents for the procedure. All questions were addressed. A time out was performed prior to the initiation of the procedure. Maximal barrier sterile technique utilized including caps, mask, sterile gowns, sterile gloves, large sterile drape, hand hygiene, and Betadine prep. Under sterile conditions and local anesthesia, ultrasound micropuncture access performed of the patent right common femoral artery. Ultrasound images obtained for documentation. Five French sheath inserted  over a guidewire. Mickelson catheter advanced and formed in the aorta. Selective catheterization of the celiac artery performed. Celiac angiogram: Atherosclerotic changes of the celiac origin. The splenic, left gastric, AND hepatic vasculature all remain patent. Previous coil embolization of the right gastric artery. There is retrograde flow again noted in the gastroduodenal artery therefore coil embolization not performed. On the venous phase, the splenic and portal veins are patent. Progreat microcatheter advanced through the [facility] catheter into the common hepatic artery. Common hepatic angiogram performed. Common hepatic: The common, proper, left, and right hepatic vasculature all remain patent. With delayed imaging, there is patchy parenchymal enhancement throughout the liver noted compatible with a combination vascular tumor blushing and previous Y 90 radio embolization. Over a double angle micro Glidewire, the Progreat catheter was advanced into the left hepatic artery. Left hepatic angiogram performed. Left hepatic: Left hepatic artery and its parenchymal branches remain patent. Minor left hepatic artery vaso spasm related to wire access. With delayed imaging, there is vascular tumor blushing in the left hepatic lobe lateral segment correlating with the new left hepatic lesion by MRI. Catheter was retracted advanced over the double angled glidewire into the right hepatic artery. Selective right hepatic angiogram performed. Right hepatic: The right hepatic artery and its parenchymal branches remain patent. There is altered perfusion and vascular blushing in the inferior right liver following previous right hepatic Y 90 radio embolization. Catheter was retracted readvanced into the left hepatic artery. Repeat left hepatic angiogram performed. This confirms catheter position for MAA injection. Left hepatic artery technetium MAA injection for shunt calculation. The 5 millicurie dose of technetium MAA was  injected slowly into left hepatic artery for hepatic pulmonary shunt calculation. Access removed. Patient tolerated the procedure well. No immediate complication. Hemostasis obtained with the ExoSeal device. Access requirements: 5 [place] Mickelson catheter, Progreat or Renegade STC microcatheter, 018 double angled GT Glidewire IMPRESSION: Successful hepatic angiograms as detailed above in preparation for left hepatic Y 90 radio embolization and repeat hepatic pulmonary shunt calculation later today. Electronically Signed   By: Jerilynn Mages.  Shick M.D.   On: 01/03/2019 13:10  Ir Angiogram Selective Each Additional Vessel  Result Date: 01/03/2019 INDICATION: Multifocal cholangiocarcinoma EXAM: ULTRASOUND GUIDANCE FOR VASCULAR ACCESS CELIAC, COMMON HEPATIC, LEFT HEPATIC, AND RIGHT HEPATIC ARTERY CATHETERIZATIONS AND ANGIOGRAMS LEFT HEPATIC TECHNETIUM MAA INJECTION FOR HEPATIC PULMONARY SHUNT CALCULATION MEDICATIONS: 1% lidocaine local. ANESTHESIA/SEDATION: Moderate (conscious) sedation was employed during this procedure. A total of Versed 4.0 mg and Fentanyl 100 mcg was administered intravenously. Moderate Sedation Time: 69 minutes. The patient's level of consciousness and vital signs were monitored continuously by radiology nursing throughout the procedure under my direct supervision. CONTRAST:  155 cc Omnipaque 300 FLUOROSCOPY TIME:  Fluoroscopy Time: 7 minutes 18 seconds (736 mGy). COMPLICATIONS: None immediate. PROCEDURE: Informed consent was obtained from the patient following explanation of the procedure, risks, benefits and alternatives. The patient understands, agrees and consents for the procedure. All questions were addressed. A time out was performed prior to the initiation of the procedure. Maximal barrier sterile technique utilized including caps, mask, sterile gowns, sterile gloves, large sterile drape, hand hygiene, and Betadine prep. Under sterile conditions and local anesthesia, ultrasound micropuncture  access performed of the patent right common femoral artery. Ultrasound images obtained for documentation. Five French sheath inserted over a guidewire. Mickelson catheter advanced and formed in the aorta. Selective catheterization of the celiac artery performed. Celiac angiogram: Atherosclerotic changes of the celiac origin. The splenic, left gastric, AND hepatic vasculature all remain patent. Previous coil embolization of the right gastric artery. There is retrograde flow again noted in the gastroduodenal artery therefore coil embolization not performed. On the venous phase, the splenic and portal veins are patent. Progreat microcatheter advanced through the [facility] catheter into the common hepatic artery. Common hepatic angiogram performed. Common hepatic: The common, proper, left, and right hepatic vasculature all remain patent. With delayed imaging, there is patchy parenchymal enhancement throughout the liver noted compatible with a combination vascular tumor blushing and previous Y 90 radio embolization. Over a double angle micro Glidewire, the Progreat catheter was advanced into the left hepatic artery. Left hepatic angiogram performed. Left hepatic: Left hepatic artery and its parenchymal branches remain patent. Minor left hepatic artery vaso spasm related to wire access. With delayed imaging, there is vascular tumor blushing in the left hepatic lobe lateral segment correlating with the new left hepatic lesion by MRI. Catheter was retracted advanced over the double angled glidewire into the right hepatic artery. Selective right hepatic angiogram performed. Right hepatic: The right hepatic artery and its parenchymal branches remain patent. There is altered perfusion and vascular blushing in the inferior right liver following previous right hepatic Y 90 radio embolization. Catheter was retracted readvanced into the left hepatic artery. Repeat left hepatic angiogram performed. This confirms catheter position  for MAA injection. Left hepatic artery technetium MAA injection for shunt calculation. The 5 millicurie dose of technetium MAA was injected slowly into left hepatic artery for hepatic pulmonary shunt calculation. Access removed. Patient tolerated the procedure well. No immediate complication. Hemostasis obtained with the ExoSeal device. Access requirements: 5 [place] Mickelson catheter, Progreat or Renegade STC microcatheter, 018 double angled GT Glidewire IMPRESSION: Successful hepatic angiograms as detailed above in preparation for left hepatic Y 90 radio embolization and repeat hepatic pulmonary shunt calculation later today. Electronically Signed   By: Jerilynn Mages.  Shick M.D.   On: 01/03/2019 13:10   Ir Angiogram Selective Each Additional Vessel  Result Date: 01/03/2019 INDICATION: Multifocal cholangiocarcinoma EXAM: ULTRASOUND GUIDANCE FOR VASCULAR ACCESS CELIAC, COMMON HEPATIC, LEFT HEPATIC, AND RIGHT HEPATIC ARTERY CATHETERIZATIONS AND ANGIOGRAMS LEFT HEPATIC TECHNETIUM MAA INJECTION  FOR HEPATIC PULMONARY SHUNT CALCULATION MEDICATIONS: 1% lidocaine local. ANESTHESIA/SEDATION: Moderate (conscious) sedation was employed during this procedure. A total of Versed 4.0 mg and Fentanyl 100 mcg was administered intravenously. Moderate Sedation Time: 69 minutes. The patient's level of consciousness and vital signs were monitored continuously by radiology nursing throughout the procedure under my direct supervision. CONTRAST:  155 cc Omnipaque 300 FLUOROSCOPY TIME:  Fluoroscopy Time: 7 minutes 18 seconds (736 mGy). COMPLICATIONS: None immediate. PROCEDURE: Informed consent was obtained from the patient following explanation of the procedure, risks, benefits and alternatives. The patient understands, agrees and consents for the procedure. All questions were addressed. A time out was performed prior to the initiation of the procedure. Maximal barrier sterile technique utilized including caps, mask, sterile gowns, sterile  gloves, large sterile drape, hand hygiene, and Betadine prep. Under sterile conditions and local anesthesia, ultrasound micropuncture access performed of the patent right common femoral artery. Ultrasound images obtained for documentation. Five French sheath inserted over a guidewire. Mickelson catheter advanced and formed in the aorta. Selective catheterization of the celiac artery performed. Celiac angiogram: Atherosclerotic changes of the celiac origin. The splenic, left gastric, AND hepatic vasculature all remain patent. Previous coil embolization of the right gastric artery. There is retrograde flow again noted in the gastroduodenal artery therefore coil embolization not performed. On the venous phase, the splenic and portal veins are patent. Progreat microcatheter advanced through the [facility] catheter into the common hepatic artery. Common hepatic angiogram performed. Common hepatic: The common, proper, left, and right hepatic vasculature all remain patent. With delayed imaging, there is patchy parenchymal enhancement throughout the liver noted compatible with a combination vascular tumor blushing and previous Y 90 radio embolization. Over a double angle micro Glidewire, the Progreat catheter was advanced into the left hepatic artery. Left hepatic angiogram performed. Left hepatic: Left hepatic artery and its parenchymal branches remain patent. Minor left hepatic artery vaso spasm related to wire access. With delayed imaging, there is vascular tumor blushing in the left hepatic lobe lateral segment correlating with the new left hepatic lesion by MRI. Catheter was retracted advanced over the double angled glidewire into the right hepatic artery. Selective right hepatic angiogram performed. Right hepatic: The right hepatic artery and its parenchymal branches remain patent. There is altered perfusion and vascular blushing in the inferior right liver following previous right hepatic Y 90 radio embolization.  Catheter was retracted readvanced into the left hepatic artery. Repeat left hepatic angiogram performed. This confirms catheter position for MAA injection. Left hepatic artery technetium MAA injection for shunt calculation. The 5 millicurie dose of technetium MAA was injected slowly into left hepatic artery for hepatic pulmonary shunt calculation. Access removed. Patient tolerated the procedure well. No immediate complication. Hemostasis obtained with the ExoSeal device. Access requirements: 5 [place] Mickelson catheter, Progreat or Renegade STC microcatheter, 018 double angled GT Glidewire IMPRESSION: Successful hepatic angiograms as detailed above in preparation for left hepatic Y 90 radio embolization and repeat hepatic pulmonary shunt calculation later today. Electronically Signed   By: Jerilynn Mages.  Shick M.D.   On: 01/03/2019 13:10   Ir Angiogram Selective Each Additional Vessel  Result Date: 01/03/2019 INDICATION: Multifocal cholangiocarcinoma EXAM: ULTRASOUND GUIDANCE FOR VASCULAR ACCESS CELIAC, COMMON HEPATIC, LEFT HEPATIC, AND RIGHT HEPATIC ARTERY CATHETERIZATIONS AND ANGIOGRAMS LEFT HEPATIC TECHNETIUM MAA INJECTION FOR HEPATIC PULMONARY SHUNT CALCULATION MEDICATIONS: 1% lidocaine local. ANESTHESIA/SEDATION: Moderate (conscious) sedation was employed during this procedure. A total of Versed 4.0 mg and Fentanyl 100 mcg was administered intravenously. Moderate Sedation Time: 69 minutes.  The patient's level of consciousness and vital signs were monitored continuously by radiology nursing throughout the procedure under my direct supervision. CONTRAST:  155 cc Omnipaque 300 FLUOROSCOPY TIME:  Fluoroscopy Time: 7 minutes 18 seconds (736 mGy). COMPLICATIONS: None immediate. PROCEDURE: Informed consent was obtained from the patient following explanation of the procedure, risks, benefits and alternatives. The patient understands, agrees and consents for the procedure. All questions were addressed. A time out was performed  prior to the initiation of the procedure. Maximal barrier sterile technique utilized including caps, mask, sterile gowns, sterile gloves, large sterile drape, hand hygiene, and Betadine prep. Under sterile conditions and local anesthesia, ultrasound micropuncture access performed of the patent right common femoral artery. Ultrasound images obtained for documentation. Five French sheath inserted over a guidewire. Mickelson catheter advanced and formed in the aorta. Selective catheterization of the celiac artery performed. Celiac angiogram: Atherosclerotic changes of the celiac origin. The splenic, left gastric, AND hepatic vasculature all remain patent. Previous coil embolization of the right gastric artery. There is retrograde flow again noted in the gastroduodenal artery therefore coil embolization not performed. On the venous phase, the splenic and portal veins are patent. Progreat microcatheter advanced through the [facility] catheter into the common hepatic artery. Common hepatic angiogram performed. Common hepatic: The common, proper, left, and right hepatic vasculature all remain patent. With delayed imaging, there is patchy parenchymal enhancement throughout the liver noted compatible with a combination vascular tumor blushing and previous Y 90 radio embolization. Over a double angle micro Glidewire, the Progreat catheter was advanced into the left hepatic artery. Left hepatic angiogram performed. Left hepatic: Left hepatic artery and its parenchymal branches remain patent. Minor left hepatic artery vaso spasm related to wire access. With delayed imaging, there is vascular tumor blushing in the left hepatic lobe lateral segment correlating with the new left hepatic lesion by MRI. Catheter was retracted advanced over the double angled glidewire into the right hepatic artery. Selective right hepatic angiogram performed. Right hepatic: The right hepatic artery and its parenchymal branches remain patent. There is  altered perfusion and vascular blushing in the inferior right liver following previous right hepatic Y 90 radio embolization. Catheter was retracted readvanced into the left hepatic artery. Repeat left hepatic angiogram performed. This confirms catheter position for MAA injection. Left hepatic artery technetium MAA injection for shunt calculation. The 5 millicurie dose of technetium MAA was injected slowly into left hepatic artery for hepatic pulmonary shunt calculation. Access removed. Patient tolerated the procedure well. No immediate complication. Hemostasis obtained with the ExoSeal device. Access requirements: 5 [place] Mickelson catheter, Progreat or Renegade STC microcatheter, 018 double angled GT Glidewire IMPRESSION: Successful hepatic angiograms as detailed above in preparation for left hepatic Y 90 radio embolization and repeat hepatic pulmonary shunt calculation later today. Electronically Signed   By: Jerilynn Mages.  Shick M.D.   On: 01/03/2019 13:10   Ir US Guide Vasc Access Right  Result Date: 01/03/2019 INDICATION: Multifocal cholangiocarcinoma EXAM: ULTRASOUND GUIDANCE FOR VASCULAR ACCESS CELIAC, COMMON HEPATIC, LEFT HEPATIC, AND RIGHT HEPATIC ARTERY CATHETERIZATIONS AND ANGIOGRAMS LEFT HEPATIC TECHNETIUM MAA INJECTION FOR HEPATIC PULMONARY SHUNT CALCULATION MEDICATIONS: 1% lidocaine local. ANESTHESIA/SEDATION: Moderate (conscious) sedation was employed during this procedure. A total of Versed 4.0 mg and Fentanyl 100 mcg was administered intravenously. Moderate Sedation Time: 69 minutes. The patient's level of consciousness and vital signs were monitored continuously by radiology nursing throughout the procedure under my direct supervision. CONTRAST:  155 cc Omnipaque 300 FLUOROSCOPY TIME:  Fluoroscopy Time: 7 minutes 18 seconds (  736 mGy). COMPLICATIONS: None immediate. PROCEDURE: Informed consent was obtained from the patient following explanation of the procedure, risks, benefits and alternatives. The  patient understands, agrees and consents for the procedure. All questions were addressed. A time out was performed prior to the initiation of the procedure. Maximal barrier sterile technique utilized including caps, mask, sterile gowns, sterile gloves, large sterile drape, hand hygiene, and Betadine prep. Under sterile conditions and local anesthesia, ultrasound micropuncture access performed of the patent right common femoral artery. Ultrasound images obtained for documentation. Five French sheath inserted over a guidewire. Mickelson catheter advanced and formed in the aorta. Selective catheterization of the celiac artery performed. Celiac angiogram: Atherosclerotic changes of the celiac origin. The splenic, left gastric, AND hepatic vasculature all remain patent. Previous coil embolization of the right gastric artery. There is retrograde flow again noted in the gastroduodenal artery therefore coil embolization not performed. On the venous phase, the splenic and portal veins are patent. Progreat microcatheter advanced through the [facility] catheter into the common hepatic artery. Common hepatic angiogram performed. Common hepatic: The common, proper, left, and right hepatic vasculature all remain patent. With delayed imaging, there is patchy parenchymal enhancement throughout the liver noted compatible with a combination vascular tumor blushing and previous Y 90 radio embolization. Over a double angle micro Glidewire, the Progreat catheter was advanced into the left hepatic artery. Left hepatic angiogram performed. Left hepatic: Left hepatic artery and its parenchymal branches remain patent. Minor left hepatic artery vaso spasm related to wire access. With delayed imaging, there is vascular tumor blushing in the left hepatic lobe lateral segment correlating with the new left hepatic lesion by MRI. Catheter was retracted advanced over the double angled glidewire into the right hepatic artery. Selective right hepatic  angiogram performed. Right hepatic: The right hepatic artery and its parenchymal branches remain patent. There is altered perfusion and vascular blushing in the inferior right liver following previous right hepatic Y 90 radio embolization. Catheter was retracted readvanced into the left hepatic artery. Repeat left hepatic angiogram performed. This confirms catheter position for MAA injection. Left hepatic artery technetium MAA injection for shunt calculation. The 5 millicurie dose of technetium MAA was injected slowly into left hepatic artery for hepatic pulmonary shunt calculation. Access removed. Patient tolerated the procedure well. No immediate complication. Hemostasis obtained with the ExoSeal device. Access requirements: 5 [place] Mickelson catheter, Progreat or Renegade STC microcatheter, 018 double angled GT Glidewire IMPRESSION: Successful hepatic angiograms as detailed above in preparation for left hepatic Y 90 radio embolization and repeat hepatic pulmonary shunt calculation later today. Electronically Signed   By: Jerilynn Mages.  Shick M.D.   On: 01/03/2019 13:10   Nm Fusion  Result Date: 01/03/2019 CLINICAL DATA:  Recurrent multifocal cholangiocarcinoma. Prior percutaneous ablation of lesion in the RIGHT hepatic lobe as well as yttrium 90 micro sphere radioembolization to the RIGHT hepatic lobe (38.2 millicuries on 50/53/9767). Lesion is now in the LEFT hepatic lobe and caudate. EXAM: NUCLEAR MEDICINE LIVER SCAN; ULTRASOUND MISCELLANEOUS SOFT TISSUE TECHNIQUE: Abdominal images were obtained in multiple projections after intrahepatic arterial injection of radiopharmaceutical. SPECT imaging was performed. Lung shunt calculation was performed. RADIOPHARMACEUTICALS:  5.66millicurie MAA TECHNETIUM TO 27M ALBUMIN AGGREGATED COMPARISON:  MRI 11/20/2018,, MRI 06/11/2017 FINDINGS: The injected microaggregated albumin localizes within the liver. No evidence of activity within the stomach, duodenum, or bowel. Calculated  shunt fraction to the lungs equals 3% IMPRESSION: 1. No significant extrahepatic radiotracer activity following intrahepatic arterial injection of MAA. 2. Lung shunt fraction equals 3% Electronically  Signed   By: Suzy Bouchard M.D.   On: 01/03/2019 15:07    Labs:  CBC: Recent Labs    08/24/18 1307 01/03/19 0823 01/16/19 0816  WBC 8.1 3.4* 3.2*  HGB 10.2* 9.8* 9.7*  HCT 32.7* 32.3* 32.6*  PLT 220 258 246    COAGS: Recent Labs    01/03/19 0823 01/16/19 0817  INR 0.94 0.93    BMP: Recent Labs    07/03/18 0700 08/24/18 1307 11/20/18 0706 01/03/19 0823  NA  --  137  --  139  K  --  3.3*  --  3.7  CL  --  104  --  104  CO2  --  24  --  26  GLUCOSE  --  156*  --  119*  BUN  --  11  --  14  CALCIUM  --  9.2  --  9.6  CREATININE 0.60 0.59 0.60 0.49  GFRNONAA  --  >60  --  >60  GFRAA  --  >60  --  >60    LIVER FUNCTION TESTS: Recent Labs    01/03/19 0823  BILITOT 0.5  AST 21  ALT 14  ALKPHOS 111  PROT 7.4  ALBUMIN 4.4    TUMOR MARKERS: No results for input(s): AFPTM, CEA, CA199, CHROMGRNA in the last 8760 hours.  Assessment and Plan:  Cholangiocarcinoma. Plan for image-guided mesenteric angiogram with possible left hepatic Y90 radioembolization today with Dr. Annamaria Boots. Patient is NPO. Afebrile. She does not take blood thinners. INR 0.93 seconds this AM.  Risks and benefits discussed with the patient including, but not limited to bleeding, infection, vascular injury, post procedural pain, nausea, vomiting and fatigue, contrast induced renal failure, liver failure, radiation injury to the bowel, radiation induced cholecystitis, neutropenia and possible need for additional procedures. All of the patient's questions were answered, patient is agreeable to proceed. Consent signed and in chart.   Thank you for this interesting consult.  I greatly enjoyed meeting JALISA SACCO and look forward to participating in their care.  A copy of this report was sent to  the requesting provider on this date.  Electronically Signed: Earley Abide, PA-C 01/16/2019, 8:44 AM   I spent a total of 40 Minutes in face to face in clinical consultation, greater than 50% of which was counseling/coordinating care for cholangiocarcinoma.

## 2019-01-17 LAB — AFP TUMOR MARKER: AFP, Serum, Tumor Marker: 5.2 ng/mL (ref 0.0–8.3)

## 2019-01-23 ENCOUNTER — Other Ambulatory Visit: Payer: Self-pay | Admitting: *Deleted

## 2019-01-23 ENCOUNTER — Other Ambulatory Visit: Payer: Self-pay | Admitting: Interventional Radiology

## 2019-01-23 ENCOUNTER — Telehealth: Payer: Self-pay | Admitting: Oncology

## 2019-01-23 ENCOUNTER — Inpatient Hospital Stay: Payer: PPO | Attending: Oncology | Admitting: Oncology

## 2019-01-23 DIAGNOSIS — R5381 Other malaise: Secondary | ICD-10-CM | POA: Insufficient documentation

## 2019-01-23 DIAGNOSIS — N939 Abnormal uterine and vaginal bleeding, unspecified: Secondary | ICD-10-CM | POA: Diagnosis not present

## 2019-01-23 DIAGNOSIS — C221 Intrahepatic bile duct carcinoma: Secondary | ICD-10-CM | POA: Diagnosis not present

## 2019-01-23 DIAGNOSIS — R109 Unspecified abdominal pain: Secondary | ICD-10-CM | POA: Insufficient documentation

## 2019-01-23 DIAGNOSIS — K862 Cyst of pancreas: Secondary | ICD-10-CM | POA: Diagnosis not present

## 2019-01-23 DIAGNOSIS — R630 Anorexia: Secondary | ICD-10-CM | POA: Diagnosis not present

## 2019-01-23 DIAGNOSIS — Z79899 Other long term (current) drug therapy: Secondary | ICD-10-CM | POA: Diagnosis not present

## 2019-01-23 DIAGNOSIS — R110 Nausea: Secondary | ICD-10-CM | POA: Diagnosis not present

## 2019-01-23 MED ORDER — CLONAZEPAM 0.5 MG PO TABS: 0.2500 mg | ORAL_TABLET | Freq: Three times a day (TID) | ORAL | 0 refills | Status: DC | PRN

## 2019-01-23 MED ORDER — TRAMADOL HCL 50 MG PO TABS: 50.0000 mg | ORAL_TABLET | Freq: Four times a day (QID) | ORAL | 0 refills | Status: DC | PRN

## 2019-01-23 NOTE — Telephone Encounter (Signed)
Scheduled appt per 01/29 los. ° °Printed calendar and avs. °

## 2019-01-23 NOTE — Progress Notes (Signed)
[place] OFFICE PROGRESS NOTE   Diagnosis: Adenocarcinoma  INTERVAL HISTORY:   Laura [name] returns as scheduled.  An abdominal MRI in November revealed new lesions in the left liver with a stable right liver mass.  She underwent Y 90 treatment of the left liver on 01/16/2019.  She reports tolerating the procedure well.  She has malaise for the past several weeks.  She also notes discomfort in the right flank region radiating toward the right upper abdomen.  The pain is relieved with Tylenol.  Nausea is relieved ondansetron.  She has noted vaginal bleeding intermittently.  She has been evaluated by gynecology.  The bleeding resolved after she used the Meissen cream.  Objective:  Vital signs in last 24 hours:  Blood pressure 130/63, pulse 66, temperature 98.1 F (36.7 C), temperature source Oral, resp. rate 18, height 5' 5.5" (1.664 m), weight 139 lb 9.6 oz (63.3 kg), SpO2 100 %.    HEENT: Neck without mass Lymphatics: No cervical or supraclavicular nodes Resp: Lungs clear bilaterally Cardio: Regular rate and rhythm GI: No hepatosplenomegaly, no mass, nontender Vascular: No leg edema  Lab Results:  Lab Results  Component Value Date   WBC 3.2 (L) 01/16/2019   HGB 9.7 (L) 01/16/2019   HCT 32.6 (L) 01/16/2019   MCV 80.5 01/16/2019   PLT 246 01/16/2019   NEUTROABS 1.9 01/16/2019    CMP  Lab Results  Component Value Date   NA 138 01/16/2019   K 3.9 01/16/2019   CL 103 01/16/2019   CO2 25 01/16/2019   GLUCOSE 127 (H) 01/16/2019   BUN 11 01/16/2019   CREATININE 0.46 01/16/2019   CALCIUM 9.5 01/16/2019   PROT 7.3 01/16/2019   ALBUMIN 4.1 01/16/2019   AST 24 01/16/2019   ALT 14 01/16/2019   ALKPHOS 106 01/16/2019   BILITOT 0.7 01/16/2019   GFRNONAA >60 01/16/2019   GFRAA >60 01/16/2019     Medications: I have reviewed the patient's current medications.   Assessment/Plan: 1. Right liver mass-biopsy 07/10/2016 confirmed adenocarcinoma  CT  abdomen/pelvis 06/17/2016 and MRI abdomen 06/21/2016 confirmed an isolated right liver mass and a cystic pancreas lesion  PET scan 07/28/2016-the hepatic lesion is not hypermetabolic, hypermetabolic lesion in the gastric antrum suspicious for a primary neoplasm and no other evidence of metastatic disease  Upper endoscopy 08/03/2016-no mass found, biopsy from the gastric antrum-benign  CT-guided ablation of the right liver lesion 11/03/2016  MRI abdomen 03/08/2017-ablation defect in the right liver without residual/recurrent disease  MRI liver 06/11/2017-multiple new enhancing lesions in the right hepatic lobe surrounding the ablation defect consistent with progressive cholangiocarcinoma  Y-90 radioembolization of the right liver 07/19/2017  MRI abdomen 10/09/2017-suspected progression of intrahepatic larger carcinoma within segment 6, reviewed by interventional radiology most consistent with pseudo-progression  MRI abdomen 01/08/2018- slight decrease in the segment 6 mass, no change in enhancement at the ablation bed, new tiny arterial phase area of hyperenhancement in the lateral left liver-potentially a vascular phenomena  MRI abdomen 04/03/2018- stable segment 6 mass, no change in 10 mm flash filling segment 3 lesion-potentially a vascular shunt, a porta hepatis node measures 2.2 cm compared to 1.5 cm, no new lymphadenopathy  MRI abdomen 07/03/2018 stable segment 6 liver mass, stable segment 3 arterial phase enhancing lesion stable porta hepatis adenopathy, stable pancreas cystic lesion  MRI abdomen 11/20/2018- new 2.1 cm lateral left liver lesion, new 8 mm medial left liver lesion, no change in posterior right hepatic mass, no change in necrotic hepato-duodenal  ligament lymph node no change in cystic pancreas lesion  Y 90 of the left liver 01/16/2019 2. Left leg and right foot pain  Negative left leg Doppler 07/14/2016  3. Right abdominal pain- potentially related to the dominant  right liver mass  4. Anorexia/weight loss  5. 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 has adenocarcinoma involving liver masses.  She most likely has cholangiocarcinoma.  She is now symptomatic with anorexia, nausea, and abdominal pain.  She will begin a trial of tramadol for pain.  We will submit the liver biopsy from June 25, 2016 for molecular testing and PD1 testing.  She will return for an office visit in approximately 4 weeks.  We will consider systemic treatment options based on her clinical status and the molecular testing.  [name], MD  01/23/2019  8:16 AM

## 2019-01-24 ENCOUNTER — Telehealth: Payer: Self-pay | Admitting: *Deleted

## 2019-01-24 NOTE — Telephone Encounter (Signed)
Patient called to request a PET scan before seeing Dr. Benay Spice on 02/21/19. If MD will not order this, she would like explanation as to why he does not want to order this. Call to [facility] and requested Foundation 1 and PDL1 testing on liver case #[id number]. Dx code: C22.1 and C78.7; Stage IV cholangiocarcinoma to liver

## 2019-01-24 NOTE — Telephone Encounter (Signed)
Informed patient that Dr. Benay Spice already has all the information of her disease status by the CT scan and cholangiocarcinoma is not usually followed by PET scans. Does not feel it is necessary. The molecular testing he has ordered will provide information regarding other potential therapy. She has also requested to move her 2/26 visit to 2/28 since her daughter can be here on 2/28. Appointment moved as requested.

## 2019-01-27 DIAGNOSIS — C221 Intrahepatic bile duct carcinoma: Secondary | ICD-10-CM | POA: Diagnosis not present

## 2019-01-27 DIAGNOSIS — C787 Secondary malignant neoplasm of liver and intrahepatic bile duct: Secondary | ICD-10-CM | POA: Diagnosis not present

## 2019-02-04 ENCOUNTER — Encounter (HOSPITAL_COMMUNITY): Payer: Self-pay | Admitting: Oncology

## 2019-02-05 ENCOUNTER — Ambulatory Visit
Admission: RE | Admit: 2019-02-05 | Discharge: 2019-02-05 | Disposition: A | Discharge status: Home / Self Care | Payer: PPO | Source: Ambulatory Visit | Attending: Interventional Radiology | Admitting: Interventional Radiology

## 2019-02-05 ENCOUNTER — Encounter: Payer: Self-pay | Admitting: Radiology

## 2019-02-05 DIAGNOSIS — C221 Intrahepatic bile duct carcinoma: Secondary | ICD-10-CM

## 2019-02-05 DIAGNOSIS — C787 Secondary malignant neoplasm of liver and intrahepatic bile duct: Secondary | ICD-10-CM | POA: Diagnosis not present

## 2019-02-05 DIAGNOSIS — Z9889 Other specified postprocedural states: Secondary | ICD-10-CM | POA: Diagnosis not present

## 2019-02-05 DIAGNOSIS — Z8509 Personal history of malignant neoplasm of other digestive organs: Secondary | ICD-10-CM | POA: Diagnosis not present

## 2019-02-05 HISTORY — PX: IR RADIOLOGIST EVAL & MGMT: IMG5224

## 2019-02-05 NOTE — Progress Notes (Signed)
Patient ID: SKILA ROLLINS, female   DOB: 11/22/1941, 78 y.o.   MRN: [id number]       Chief Complaint:  Cholangiocarcinoma, 1 month status post left hepatic Y 90 embolization  Referring Physician(s): Sherrill  History of Present Illness: LOUETTA HOLLINGSHEAD is a 78 y.o. 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  She is now status post right and left hepatic Y 90 embolizations.  Most recently she is 1 month status post left hepatic Y 90 embolization for new left hepatic disease by surveillance MRI.  She has recovered over the last 3 weeks.  She is getting back to her baseline physically.  Stable excellent functional status.  No signs of liver failure or jaundice.  No significant abdominal pain or flank pain.  Stable weight and appetite.  No fevers or new symptoms.  She returns for outpatient follow-up.  Past Medical History:  Diagnosis Date  . Cancer ([place])   . GERD (gastroesophageal reflux disease)   . Heart murmur   . High cholesterol   . Hypertension   . IBS (irritable bowel syndrome)   . PONV (postoperative nausea and vomiting)   . Pre-diabetes     Past Surgical History:  Procedure Laterality Date  . BREAST EXCISIONAL BIOPSY Right 1960   scar is not visible   . BREAST SURGERY     Benign  . CHOLECYSTECTOMY    . IR ANGIOGRAM SELECTIVE EACH ADDITIONAL VESSEL  07/05/2017  . IR ANGIOGRAM SELECTIVE EACH ADDITIONAL VESSEL  07/05/2017  . IR ANGIOGRAM SELECTIVE EACH ADDITIONAL VESSEL  07/05/2017  . IR ANGIOGRAM SELECTIVE EACH ADDITIONAL VESSEL  07/19/2017  . IR ANGIOGRAM SELECTIVE EACH ADDITIONAL VESSEL  01/03/2019  . IR ANGIOGRAM SELECTIVE EACH ADDITIONAL VESSEL  01/03/2019  . IR ANGIOGRAM SELECTIVE EACH ADDITIONAL VESSEL  01/03/2019  . IR ANGIOGRAM SELECTIVE EACH ADDITIONAL VESSEL  01/16/2019  . IR ANGIOGRAM SELECTIVE EACH ADDITIONAL VESSEL  01/16/2019  . IR ANGIOGRAM VISCERAL SELECTIVE  07/05/2017  . IR ANGIOGRAM VISCERAL SELECTIVE  07/05/2017  . IR  ANGIOGRAM VISCERAL SELECTIVE  07/19/2017  . IR ANGIOGRAM VISCERAL SELECTIVE  01/03/2019  . IR ANGIOGRAM VISCERAL SELECTIVE  01/16/2019  . IR EMBO ARTERIAL NOT HEMORR HEMANG INC GUIDE ROADMAPPING  07/05/2017  . IR EMBO TUMOR ORGAN ISCHEMIA INFARCT INC GUIDE ROADMAPPING  07/19/2017  . IR EMBO TUMOR ORGAN ISCHEMIA INFARCT INC GUIDE ROADMAPPING  01/16/2019  . IR GENERIC HISTORICAL  09/27/2016   IR RADIOLOGIST EVAL & MGMT 09/27/2016 Greggory Keen, MD [facility] INTERV RAD  . IR GENERIC HISTORICAL  12/07/2016   IR RADIOLOGIST EVAL & MGMT 12/07/2016 Greggory Keen, MD [facility] INTERV RAD  . IR RADIOLOGIST EVAL & MGMT  03/08/2017  . IR RADIOLOGIST EVAL & MGMT  08/16/2017  . IR RADIOLOGIST EVAL & MGMT  10/09/2017  . IR RADIOLOGIST EVAL & MGMT  06/14/2017  . IR RADIOLOGIST EVAL & MGMT  01/08/2018  . IR RADIOLOGIST EVAL & MGMT  04/03/2018  . IR RADIOLOGIST EVAL & MGMT  07/03/2018  . IR RADIOLOGIST EVAL & MGMT  11/20/2018  . IR US GUIDE VASC ACCESS RIGHT  07/05/2017  . IR US GUIDE VASC ACCESS RIGHT  07/19/2017  . IR US GUIDE VASC ACCESS RIGHT  01/03/2019  . IR US GUIDE VASC ACCESS RIGHT  01/16/2019  . TOTAL ABDOMINAL HYSTERECTOMY      Allergies: Pneumococcal vaccines and Codeine  Medications: Prior to Admission medications   Medication Sig Start Date End Date Taking? Authorizing Provider  acetaminophen (TYLENOL) 500 MG  tablet Take 1,000 mg by mouth every 6 (six) hours as needed for mild pain.    [provider]  amLODipine (NORVASC) 5 MG tablet Take 5 mg by mouth daily.  06/22/14   [provider]  aspirin EC 81 MG tablet Take 81 mg by mouth daily.    [provider]  Biotin 5000 MCG TABS Take 5,000 mcg by mouth daily.     [provider]  Cholecalciferol (VITAMIN D3) 5000 units CAPS Take 5,000 Units by mouth daily.    [provider]  clindamycin (CLEOCIN) 2 % vaginal cream Place 1 Applicatorful vaginally 2 (two) times a week.  08/29/18   [provider]    clonazePAM (KLONOPIN) 0.5 MG tablet Take 0.5-1 tablets (0.25-0.5 mg total) by mouth 3 (three) times daily as needed for anxiety. Take 0.25mg s twice daily as needed for anxiety and 0.5mg s at bedtime for sleep as needed 01/23/19   Ladell Pier, MD  docusate sodium (COLACE) 50 MG capsule Take 50 mg by mouth daily.     [provider]  doxazosin (CARDURA) 1 MG tablet Take 1 mg by mouth at bedtime.  06/22/14   [provider]  methocarbamol (ROBAXIN) 750 MG tablet Take 1 tablet by mouth 3 (three) times daily as needed for muscle spasms.  07/12/18   [provider]  Multiple Vitamins-Minerals (CENTRUM SILVER ADULT 50+ PO) Take 1 tablet by mouth.    [provider]  omeprazole (PRILOSEC) 20 MG capsule Take 20 mg by mouth 2 (two) times daily. 06/25/17   [provider]  ondansetron (ZOFRAN) 4 MG tablet Take 4 mg by mouth every 6 (six) hours as needed. for nausea 07/12/18   [provider]  polyethylene glycol (MIRALAX / GLYCOLAX) packet Take 17 g by mouth daily as needed for mild constipation.    [provider]  pravastatin (PRAVACHOL) 40 MG tablet Take 40 mg by mouth every evening.     [provider]  tetrahydrozoline 0.05 % ophthalmic solution Place 1 drop into both eyes daily as needed (dry eye).     [provider]  traMADol (ULTRAM) 50 MG tablet Take 1 tablet (50 mg total) by mouth every 6 (six) hours as needed for moderate pain. 01/23/19   Ladell Pier, MD     Family History  Problem Relation Age of Onset  . Alzheimer's disease Mother   . Alzheimer's disease Brother   . Alzheimer's disease Sister   . Breast cancer Sister   . CAD Brother 9       CABG  . Cancer Sister        Breast  . Cancer Brother        Lung    Social History   Socioeconomic History  . Marital status: Widowed    Spouse name: Lanae Boast  . Number of children: 3  . Years of education: Not on file  . Highest education level: Not on file   Occupational History  . Occupation: Retired  Scientific laboratory technician  . Financial resource strain: Not on file  . Food insecurity:    Worry: Not on file    Inability: Not on file  . Transportation needs:    Medical: Not on file    Non-medical: Not on file  Tobacco Use  . Smoking status: Never Smoker  . Smokeless tobacco: Never Used  Substance and Sexual Activity  . Alcohol use: No  . Drug use: No  . Sexual activity: Not on  file  Lifestyle  . Physical activity:    Days per week: Not on file    Minutes per session: Not on file  . Stress: Not on file  Relationships  . Social connections:    Talks on phone: Not on file    Gets together: Not on file    Attends religious service: Not on file    Active member of club or organization: Not on file    Attends meetings of clubs or organizations: Not on file    Relationship status: Not on file  Other Topics Concern  . Not on file  Social History Narrative   Lives at home with husband, Lanae Boast who is in poor health (she is caregiver)   Has #3 daughters   Retired from working at [employer]   Has total of #7 siblings    ECOG Status: 1 - Symptomatic but completely ambulatory  Review of Systems: A 12 point ROS discussed and pertinent positives are indicated in the HPI above.  All other systems are negative.  Review of Systems  Vital Signs: BP 126/60   Pulse 88   Temp 97.8 F (36.6 C)   Resp 14   Ht 5\' 5"  (1.651 m)   Wt 63 kg   SpO2 97%   BMI 23.13 kg/m   Physical Exam Constitutional:      General: She is not in acute distress.    Appearance: She is normal weight. She is not toxic-appearing.  Eyes:     General: No scleral icterus.    Conjunctiva/sclera: Conjunctivae normal.  Cardiovascular:     Rate and Rhythm: Normal rate and regular rhythm.     Pulses: Normal pulses.     Heart sounds: Normal heart sounds. No murmur.  Pulmonary:     Effort: Pulmonary effort is normal.     Breath sounds: Normal breath sounds.   Abdominal:     General: Abdomen is flat. Bowel sounds are normal. There is no distension.     Palpations: Abdomen is soft. There is no mass.  Neurological:     General: No focal deficit present.     Mental Status: She is alert.  Psychiatric:        Mood and Affect: Mood normal.        Thought Content: Thought content normal.     Mallampati Score:     Imaging: Nm Liver Img Spect  Result Date: 01/16/2019 CLINICAL DATA:  Unresectable cholangiocarcinoma. Second treatment overall. First treatment to left lobe of liver. EXAM: NUCLEAR MEDICINE SPECIAL MED RAD PHYSICS CONS; NUCLEAR MEDICINE RADIO PHARM THERAPY INTRA ARTERIAL; NUCLEAR MEDICINE TREATMENT PROCEDURE; NUCLEAR MEDICINE LIVER SCAN TECHNIQUE: In conjunction with the interventional radiologist a Y-90 Microsphere dose was calculated utilizing body surface area formulation. Calculated dose equal 14.3 mCi. Pre therapy MAA liver SPECT scan and CTA were evaluated. Utilizing a microcatheter system, the hepatic artery was selected and Y-90 microspheres were delivered in fractionated aliquots. Radiopharmaceutical was delivered by the interventional radiologist and nuclear radiologist. The patient tolerated procedure well. No adverse effects were noted. Bremsstrahlung planar and SPECT imaging of the abdomen following intrahepatic arterial delivery of Y-90 microsphere was performed. RADIOPHARMACEUTICALS:  09.3 millicuriesY-90 Microsphere dose was delivered to the patient. COMPARISON:  MR 11/20/2018. FINDINGS: Y - 90 microspheres therapy as above. First therapy the left hepatic lobe. Bremsstrahlung planar and SPECT imaging of the abdomen following intrahepatic arterial delivery of Y-71microsphere demonstrates radioactivity localized to the left hepatic lobe. No evidence of extrahepatic activity. IMPRESSION:  Successful Y - 90 microsphere delivery for treatment of unresectable liver metastasis. First therapy to the left lobe. Bremssstrahlung scan  demonstrates activity localized to left hepatic lobe with no extrahepatic activity identified. Electronically Signed   By: Kerby Moors M.D.   On: 01/16/2019 13:17   Ir Angiogram Visceral Selective  Result Date: 01/16/2019 INDICATION: MULTIFOCAL CHOLANGIOCARCINOMA EXAM: Ultrasound guidance for vascular access Celiac, common hepatic, and left hepatic catheterizations and angiograms Peripheral left hepatic Y 90 radio embolization MEDICATIONS: 3.375 g Zosyn. The antibiotic was administered within 1 hour of the procedure 40 mg Protonix, 8 mg Zofran ANESTHESIA/SEDATION: Moderate (conscious) sedation was employed during this procedure. A total of Versed 5.0 mg and Fentanyl 200 mcg was administered intravenously. Moderate Sedation Time: 41 minutes. The patient's level of consciousness and vital signs were monitored continuously by radiology nursing throughout the procedure under my direct supervision. CONTRAST:  50 cc Isovue 300 FLUOROSCOPY TIME:  Fluoroscopy Time: 5 minutes 12 seconds (130 mGy). COMPLICATIONS: None immediate. PROCEDURE: Informed consent was obtained from the patient following explanation of the procedure, risks, benefits and alternatives. The patient understands, agrees and consents for the procedure. All questions were addressed. A time out was performed prior to the initiation of the procedure. Maximal barrier sterile technique utilized including caps, mask, sterile gowns, sterile gloves, large sterile drape, hand hygiene, and Betadine prep. Under sterile conditions and local anesthesia, ultrasound micropuncture access performed of the patent right common femoral artery. Ultrasound images obtained for documentation. Five French sheath inserted. Mickelson catheter reformed in the aorta and utilized to select the celiac artery. Celiac angiogram: The celiac origin including its branches remains widely patent. The splenic, left gastric, hepatic vasculature patent. Previous coil embolization of the  right gastric artery. Retrograde flow again noted in the gastroduodenal artery. Renegade STC microcatheter advanced to the common hepatic artery. Common hepatic angiogram performed. Common hepatic: The common, proper, left and right hepatic arteries all remain patent. With delayed imaging, there is tumor blushing in the left hepatic lobe laterally as before. Microcatheter advanced over a double angled glidewire into the left hepatic artery. Left hepatic angiogram performed. Left hepatic: The left hepatic artery including its branches remain patent. With delayed imaging, there is tumor blushing within the left hepatic vasculature which correlates with the lesion by MRI. Left hepatic Y 90 radio embolization: From this location, the entire dose of Y 90 was instilled slowly with intermittent fluoroscopy into the left hepatic lobe. Postprocedure imaging demonstrates preserved antegrade flow in the left hepatic vasculature. Access removed. Hemostasis obtained with the ExoSeal device. No immediate complication. Patient tolerated the procedure well. 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 (First treatment to the left lobe) Electronically Signed   By: Jerilynn Mages.  Konstantinos Cordoba M.D.   On: 01/16/2019 11:27   Ir Angiogram Selective Each Additional Vessel  Result Date: 01/16/2019 INDICATION: MULTIFOCAL CHOLANGIOCARCINOMA EXAM: Ultrasound guidance for vascular access Celiac, common hepatic, and left hepatic catheterizations and angiograms Peripheral left hepatic Y 90 radio embolization MEDICATIONS: 3.375 g Zosyn. The antibiotic was administered within 1 hour of the procedure 40 mg Protonix, 8 mg Zofran ANESTHESIA/SEDATION: Moderate (conscious) sedation was employed during this procedure. A total of Versed 5.0 mg and Fentanyl 200 mcg was administered intravenously. Moderate Sedation Time: 41 minutes. The patient's level of consciousness and vital signs were monitored continuously by  radiology nursing throughout the procedure under my direct supervision. CONTRAST:  50 cc Isovue 300 FLUOROSCOPY TIME:  Fluoroscopy Time: 5 minutes 12 seconds (130 mGy).  COMPLICATIONS: None immediate. PROCEDURE: Informed consent was obtained from the patient following explanation of the procedure, risks, benefits and alternatives. The patient understands, agrees and consents for the procedure. All questions were addressed. A time out was performed prior to the initiation of the procedure. Maximal barrier sterile technique utilized including caps, mask, sterile gowns, sterile gloves, large sterile drape, hand hygiene, and Betadine prep. Under sterile conditions and local anesthesia, ultrasound micropuncture access performed of the patent right common femoral artery. Ultrasound images obtained for documentation. Five French sheath inserted. Mickelson catheter reformed in the aorta and utilized to select the celiac artery. Celiac angiogram: The celiac origin including its branches remains widely patent. The splenic, left gastric, hepatic vasculature patent. Previous coil embolization of the right gastric artery. Retrograde flow again noted in the gastroduodenal artery. Renegade STC microcatheter advanced to the common hepatic artery. Common hepatic angiogram performed. Common hepatic: The common, proper, left and right hepatic arteries all remain patent. With delayed imaging, there is tumor blushing in the left hepatic lobe laterally as before. Microcatheter advanced over a double angled glidewire into the left hepatic artery. Left hepatic angiogram performed. Left hepatic: The left hepatic artery including its branches remain patent. With delayed imaging, there is tumor blushing within the left hepatic vasculature which correlates with the lesion by MRI. Left hepatic Y 90 radio embolization: From this location, the entire dose of Y 90 was instilled slowly with intermittent fluoroscopy into the left hepatic lobe.  Postprocedure imaging demonstrates preserved antegrade flow in the left hepatic vasculature. Access removed. Hemostasis obtained with the ExoSeal device. No immediate complication. Patient tolerated the procedure well. 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 (First treatment to the left lobe) Electronically Signed   By: Jerilynn Mages.  Lauria Depoy M.D.   On: 01/16/2019 11:27   Ir Angiogram Selective Each Additional Vessel  Result Date: 01/16/2019 INDICATION: MULTIFOCAL CHOLANGIOCARCINOMA EXAM: Ultrasound guidance for vascular access Celiac, common hepatic, and left hepatic catheterizations and angiograms Peripheral left hepatic Y 90 radio embolization MEDICATIONS: 3.375 g Zosyn. The antibiotic was administered within 1 hour of the procedure 40 mg Protonix, 8 mg Zofran ANESTHESIA/SEDATION: Moderate (conscious) sedation was employed during this procedure. A total of Versed 5.0 mg and Fentanyl 200 mcg was administered intravenously. Moderate Sedation Time: 41 minutes. The patient's level of consciousness and vital signs were monitored continuously by radiology nursing throughout the procedure under my direct supervision. CONTRAST:  50 cc Isovue 300 FLUOROSCOPY TIME:  Fluoroscopy Time: 5 minutes 12 seconds (130 mGy). COMPLICATIONS: None immediate. PROCEDURE: Informed consent was obtained from the patient following explanation of the procedure, risks, benefits and alternatives. The patient understands, agrees and consents for the procedure. All questions were addressed. A time out was performed prior to the initiation of the procedure. Maximal barrier sterile technique utilized including caps, mask, sterile gowns, sterile gloves, large sterile drape, hand hygiene, and Betadine prep. Under sterile conditions and local anesthesia, ultrasound micropuncture access performed of the patent right common femoral artery. Ultrasound images obtained for documentation. Five French sheath  inserted. Mickelson catheter reformed in the aorta and utilized to select the celiac artery. Celiac angiogram: The celiac origin including its branches remains widely patent. The splenic, left gastric, hepatic vasculature patent. Previous coil embolization of the right gastric artery. Retrograde flow again noted in the gastroduodenal artery. Renegade STC microcatheter advanced to the common hepatic artery. Common hepatic angiogram performed. Common hepatic: The common, proper, left and right hepatic arteries all remain patent. With delayed imaging,  there is tumor blushing in the left hepatic lobe laterally as before. Microcatheter advanced over a double angled glidewire into the left hepatic artery. Left hepatic angiogram performed. Left hepatic: The left hepatic artery including its branches remain patent. With delayed imaging, there is tumor blushing within the left hepatic vasculature which correlates with the lesion by MRI. Left hepatic Y 90 radio embolization: From this location, the entire dose of Y 90 was instilled slowly with intermittent fluoroscopy into the left hepatic lobe. Postprocedure imaging demonstrates preserved antegrade flow in the left hepatic vasculature. Access removed. Hemostasis obtained with the ExoSeal device. No immediate complication. Patient tolerated the procedure well. 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 (First treatment to the left lobe) Electronically Signed   By: Jerilynn Mages.  Demetria Iwai M.D.   On: 01/16/2019 11:27   Nm Special Med Rad Physics Cons  Result Date: 01/16/2019 CLINICAL DATA:  Unresectable cholangiocarcinoma. Second treatment overall. First treatment to left lobe of liver. EXAM: NUCLEAR MEDICINE SPECIAL MED RAD PHYSICS CONS; NUCLEAR MEDICINE RADIO PHARM THERAPY INTRA ARTERIAL; NUCLEAR MEDICINE TREATMENT PROCEDURE; NUCLEAR MEDICINE LIVER SCAN TECHNIQUE: In conjunction with the interventional radiologist a Y-90 Microsphere  dose was calculated utilizing body surface area formulation. Calculated dose equal 14.3 mCi. Pre therapy MAA liver SPECT scan and CTA were evaluated. Utilizing a microcatheter system, the hepatic artery was selected and Y-90 microspheres were delivered in fractionated aliquots. Radiopharmaceutical was delivered by the interventional radiologist and nuclear radiologist. The patient tolerated procedure well. No adverse effects were noted. Bremsstrahlung planar and SPECT imaging of the abdomen following intrahepatic arterial delivery of Y-90 microsphere was performed. RADIOPHARMACEUTICALS:  03.5 millicuriesY-90 Microsphere dose was delivered to the patient. COMPARISON:  MR 11/20/2018. FINDINGS: Y - 90 microspheres therapy as above. First therapy the left hepatic lobe. Bremsstrahlung planar and SPECT imaging of the abdomen following intrahepatic arterial delivery of Y-78microsphere demonstrates radioactivity localized to the left hepatic lobe. No evidence of extrahepatic activity. IMPRESSION: Successful Y - 90 microsphere delivery for treatment of unresectable liver metastasis. First therapy to the left lobe. Bremssstrahlung scan demonstrates activity localized to left hepatic lobe with no extrahepatic activity identified. Electronically Signed   By: Kerby Moors M.D.   On: 01/16/2019 13:17   Nm Special Treatment Procedure  Result Date: 01/16/2019 CLINICAL DATA:  Unresectable cholangiocarcinoma. Second treatment overall. First treatment to left lobe of liver. EXAM: NUCLEAR MEDICINE SPECIAL MED RAD PHYSICS CONS; NUCLEAR MEDICINE RADIO PHARM THERAPY INTRA ARTERIAL; NUCLEAR MEDICINE TREATMENT PROCEDURE; NUCLEAR MEDICINE LIVER SCAN TECHNIQUE: In conjunction with the interventional radiologist a Y-90 Microsphere dose was calculated utilizing body surface area formulation. Calculated dose equal 14.3 mCi. Pre therapy MAA liver SPECT scan and CTA were evaluated. Utilizing a microcatheter system, the hepatic artery was  selected and Y-90 microspheres were delivered in fractionated aliquots. Radiopharmaceutical was delivered by the interventional radiologist and nuclear radiologist. The patient tolerated procedure well. No adverse effects were noted. Bremsstrahlung planar and SPECT imaging of the abdomen following intrahepatic arterial delivery of Y-90 microsphere was performed. RADIOPHARMACEUTICALS:  00.9 millicuriesY-90 Microsphere dose was delivered to the patient. COMPARISON:  MR 11/20/2018. FINDINGS: Y - 90 microspheres therapy as above. First therapy the left hepatic lobe. Bremsstrahlung planar and SPECT imaging of the abdomen following intrahepatic arterial delivery of Y-47microsphere demonstrates radioactivity localized to the left hepatic lobe. No evidence of extrahepatic activity. IMPRESSION: Successful Y - 90 microsphere delivery for treatment of unresectable liver metastasis. First therapy to the left lobe. Bremssstrahlung scan demonstrates activity localized to  left hepatic lobe with no extrahepatic activity identified. Electronically Signed   By: Kerby Moors M.D.   On: 01/16/2019 13:17   Ir US Guide Vasc Access Right  Result Date: 01/16/2019 INDICATION: MULTIFOCAL CHOLANGIOCARCINOMA EXAM: Ultrasound guidance for vascular access Celiac, common hepatic, and left hepatic catheterizations and angiograms Peripheral left hepatic Y 90 radio embolization MEDICATIONS: 3.375 g Zosyn. The antibiotic was administered within 1 hour of the procedure 40 mg Protonix, 8 mg Zofran ANESTHESIA/SEDATION: Moderate (conscious) sedation was employed during this procedure. A total of Versed 5.0 mg and Fentanyl 200 mcg was administered intravenously. Moderate Sedation Time: 41 minutes. The patient's level of consciousness and vital signs were monitored continuously by radiology nursing throughout the procedure under my direct supervision. CONTRAST:  50 cc Isovue 300 FLUOROSCOPY TIME:  Fluoroscopy Time: 5 minutes 12 seconds (130 mGy).  COMPLICATIONS: None immediate. PROCEDURE: Informed consent was obtained from the patient following explanation of the procedure, risks, benefits and alternatives. The patient understands, agrees and consents for the procedure. All questions were addressed. A time out was performed prior to the initiation of the procedure. Maximal barrier sterile technique utilized including caps, mask, sterile gowns, sterile gloves, large sterile drape, hand hygiene, and Betadine prep. Under sterile conditions and local anesthesia, ultrasound micropuncture access performed of the patent right common femoral artery. Ultrasound images obtained for documentation. Five French sheath inserted. Mickelson catheter reformed in the aorta and utilized to select the celiac artery. Celiac angiogram: The celiac origin including its branches remains widely patent. The splenic, left gastric, hepatic vasculature patent. Previous coil embolization of the right gastric artery. Retrograde flow again noted in the gastroduodenal artery. Renegade STC microcatheter advanced to the common hepatic artery. Common hepatic angiogram performed. Common hepatic: The common, proper, left and right hepatic arteries all remain patent. With delayed imaging, there is tumor blushing in the left hepatic lobe laterally as before. Microcatheter advanced over a double angled glidewire into the left hepatic artery. Left hepatic angiogram performed. Left hepatic: The left hepatic artery including its branches remain patent. With delayed imaging, there is tumor blushing within the left hepatic vasculature which correlates with the lesion by MRI. Left hepatic Y 90 radio embolization: From this location, the entire dose of Y 90 was instilled slowly with intermittent fluoroscopy into the left hepatic lobe. Postprocedure imaging demonstrates preserved antegrade flow in the left hepatic vasculature. Access removed. Hemostasis obtained with the ExoSeal device. No immediate  complication. Patient tolerated the procedure well. 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 (First treatment to the left lobe) Electronically Signed   By: Jerilynn Mages.  Forest Redwine M.D.   On: 01/16/2019 11:27   Ir Embo Tumor Organ Ischemia Infarct Inc Guide Roadmapping  Result Date: 01/16/2019 INDICATION: MULTIFOCAL CHOLANGIOCARCINOMA EXAM: Ultrasound guidance for vascular access Celiac, common hepatic, and left hepatic catheterizations and angiograms Peripheral left hepatic Y 90 radio embolization MEDICATIONS: 3.375 g Zosyn. The antibiotic was administered within 1 hour of the procedure 40 mg Protonix, 8 mg Zofran ANESTHESIA/SEDATION: Moderate (conscious) sedation was employed during this procedure. A total of Versed 5.0 mg and Fentanyl 200 mcg was administered intravenously. Moderate Sedation Time: 41 minutes. The patient's level of consciousness and vital signs were monitored continuously by radiology nursing throughout the procedure under my direct supervision. CONTRAST:  50 cc Isovue 300 FLUOROSCOPY TIME:  Fluoroscopy Time: 5 minutes 12 seconds (130 mGy). COMPLICATIONS: None immediate. PROCEDURE: Informed consent was obtained from the patient following explanation of the procedure, risks, benefits and alternatives.  The patient understands, agrees and consents for the procedure. All questions were addressed. A time out was performed prior to the initiation of the procedure. Maximal barrier sterile technique utilized including caps, mask, sterile gowns, sterile gloves, large sterile drape, hand hygiene, and Betadine prep. Under sterile conditions and local anesthesia, ultrasound micropuncture access performed of the patent right common femoral artery. Ultrasound images obtained for documentation. Five French sheath inserted. Mickelson catheter reformed in the aorta and utilized to select the celiac artery. Celiac angiogram: The celiac origin including its branches  remains widely patent. The splenic, left gastric, hepatic vasculature patent. Previous coil embolization of the right gastric artery. Retrograde flow again noted in the gastroduodenal artery. Renegade STC microcatheter advanced to the common hepatic artery. Common hepatic angiogram performed. Common hepatic: The common, proper, left and right hepatic arteries all remain patent. With delayed imaging, there is tumor blushing in the left hepatic lobe laterally as before. Microcatheter advanced over a double angled glidewire into the left hepatic artery. Left hepatic angiogram performed. Left hepatic: The left hepatic artery including its branches remain patent. With delayed imaging, there is tumor blushing within the left hepatic vasculature which correlates with the lesion by MRI. Left hepatic Y 90 radio embolization: From this location, the entire dose of Y 90 was instilled slowly with intermittent fluoroscopy into the left hepatic lobe. Postprocedure imaging demonstrates preserved antegrade flow in the left hepatic vasculature. Access removed. Hemostasis obtained with the ExoSeal device. No immediate complication. Patient tolerated the procedure well. 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 (First treatment to the left lobe) Electronically Signed   By: Jerilynn Mages.  Jahlisa Rossitto M.D.   On: 01/16/2019 11:27   Nm Radio Pharm Therapy Intraarterial  Result Date: 01/16/2019 CLINICAL DATA:  Unresectable cholangiocarcinoma. Second treatment overall. First treatment to left lobe of liver. EXAM: NUCLEAR MEDICINE SPECIAL MED RAD PHYSICS CONS; NUCLEAR MEDICINE RADIO PHARM THERAPY INTRA ARTERIAL; NUCLEAR MEDICINE TREATMENT PROCEDURE; NUCLEAR MEDICINE LIVER SCAN TECHNIQUE: In conjunction with the interventional radiologist a Y-90 Microsphere dose was calculated utilizing body surface area formulation. Calculated dose equal 14.3 mCi. Pre therapy MAA liver SPECT scan and CTA were  evaluated. Utilizing a microcatheter system, the hepatic artery was selected and Y-90 microspheres were delivered in fractionated aliquots. Radiopharmaceutical was delivered by the interventional radiologist and nuclear radiologist. The patient tolerated procedure well. No adverse effects were noted. Bremsstrahlung planar and SPECT imaging of the abdomen following intrahepatic arterial delivery of Y-90 microsphere was performed. RADIOPHARMACEUTICALS:  58.5 millicuriesY-90 Microsphere dose was delivered to the patient. COMPARISON:  MR 11/20/2018. FINDINGS: Y - 90 microspheres therapy as above. First therapy the left hepatic lobe. Bremsstrahlung planar and SPECT imaging of the abdomen following intrahepatic arterial delivery of Y-36microsphere demonstrates radioactivity localized to the left hepatic lobe. No evidence of extrahepatic activity. IMPRESSION: Successful Y - 90 microsphere delivery for treatment of unresectable liver metastasis. First therapy to the left lobe. Bremssstrahlung scan demonstrates activity localized to left hepatic lobe with no extrahepatic activity identified. Electronically Signed   By: Kerby Moors M.D.   On: 01/16/2019 13:17    Labs:  CBC: Recent Labs    08/24/18 1307 01/03/19 0823 01/16/19 0816  WBC 8.1 3.4* 3.2*  HGB 10.2* 9.8* 9.7*  HCT 32.7* 32.3* 32.6*  PLT 220 258 246    COAGS: Recent Labs    01/03/19 0823 01/16/19 0817  INR 0.94 0.93    BMP: Recent Labs    08/24/18 1307 11/20/18 0706 01/03/19 0823 01/16/19  0816  NA 137  --  139 138  K 3.3*  --  3.7 3.9  CL 104  --  104 103  CO2 24  --  26 25  GLUCOSE 156*  --  119* 127*  BUN 11  --  14 11  CALCIUM 9.2  --  9.6 9.5  CREATININE 0.59 0.60 0.49 0.46  GFRNONAA >60  --  >60 >60  GFRAA >60  --  >60 >60    LIVER FUNCTION TESTS: Recent Labs    01/03/19 0823 01/16/19 0816  BILITOT 0.5 0.7  AST 21 24  ALT 14 14  ALKPHOS 111 106  PROT 7.4 7.3  ALBUMIN 4.4 4.1    TUMOR MARKERS: No  results for input(s): AFPTM, CEA, CA199, CHROMGRNA in the last 8760 hours.  Assessment and Plan:  Recent surveillance MRI demonstrated 2 new left hepatic enhancing lesions.  For the new left hepatic disease, she is now 1 month status post left hepatic Y 90 embolization.  She has now had a whole liver treatment.  Overall she is recovering very well.  Plan: Continue surveillance MRI at 3 months.  She has outpatient oncology appointment to discuss adding chemotherapy.  Thank you for this interesting consult.  I greatly enjoyed meeting CAPRISHA BRIDGETT and look forward to participating in their care.  A copy of this report was sent to the requesting provider on this date.  Electronically Signed: Greggory Keen 02/05/2019, 11:52 AM   I spent a total of    25 Minutes in face to face in clinical consultation, greater than 50% of which was counseling/coordinating care for this patient with cholangiocarcinoma.

## 2019-02-07 DIAGNOSIS — E7800 Pure hypercholesterolemia, unspecified: Secondary | ICD-10-CM | POA: Diagnosis not present

## 2019-02-07 DIAGNOSIS — R7302 Impaired glucose tolerance (oral): Secondary | ICD-10-CM | POA: Diagnosis not present

## 2019-02-07 DIAGNOSIS — I10 Essential (primary) hypertension: Secondary | ICD-10-CM | POA: Diagnosis not present

## 2019-02-14 DIAGNOSIS — J302 Other seasonal allergic rhinitis: Secondary | ICD-10-CM | POA: Diagnosis not present

## 2019-02-14 DIAGNOSIS — R7302 Impaired glucose tolerance (oral): Secondary | ICD-10-CM | POA: Diagnosis not present

## 2019-02-14 DIAGNOSIS — I5189 Other ill-defined heart diseases: Secondary | ICD-10-CM | POA: Diagnosis not present

## 2019-02-14 DIAGNOSIS — C228 Malignant neoplasm of liver, primary, unspecified as to type: Secondary | ICD-10-CM | POA: Diagnosis not present

## 2019-02-14 DIAGNOSIS — Z1331 Encounter for screening for depression: Secondary | ICD-10-CM | POA: Diagnosis not present

## 2019-02-14 DIAGNOSIS — E7800 Pure hypercholesterolemia, unspecified: Secondary | ICD-10-CM | POA: Diagnosis not present

## 2019-02-14 DIAGNOSIS — K589 Irritable bowel syndrome without diarrhea: Secondary | ICD-10-CM | POA: Diagnosis not present

## 2019-02-14 DIAGNOSIS — I517 Cardiomegaly: Secondary | ICD-10-CM | POA: Diagnosis not present

## 2019-02-14 DIAGNOSIS — R82998 Other abnormal findings in urine: Secondary | ICD-10-CM | POA: Diagnosis not present

## 2019-02-14 DIAGNOSIS — Z6822 Body mass index (BMI) 22.0-22.9, adult: Secondary | ICD-10-CM | POA: Diagnosis not present

## 2019-02-14 DIAGNOSIS — Z0000 Encounter for general adult medical examination without abnormal findings: Secondary | ICD-10-CM | POA: Diagnosis not present

## 2019-02-14 DIAGNOSIS — F430 Acute stress reaction: Secondary | ICD-10-CM | POA: Diagnosis not present

## 2019-02-14 DIAGNOSIS — I10 Essential (primary) hypertension: Secondary | ICD-10-CM | POA: Diagnosis not present

## 2019-02-17 DIAGNOSIS — Z7689 Persons encountering health services in other specified circumstances: Secondary | ICD-10-CM | POA: Diagnosis not present

## 2019-02-18 DIAGNOSIS — Z1212 Encounter for screening for malignant neoplasm of rectum: Secondary | ICD-10-CM | POA: Diagnosis not present

## 2019-02-19 ENCOUNTER — Ambulatory Visit: Payer: PPO | Admitting: Oncology

## 2019-02-20 NOTE — Addendum Note (Signed)
Encounter addended by: Ernestene Mention, RN on: 02/20/2019 11:09 AM  Actions taken: Charge Capture section accepted

## 2019-02-21 ENCOUNTER — Telehealth: Payer: Self-pay | Admitting: Oncology

## 2019-02-21 ENCOUNTER — Inpatient Hospital Stay: Payer: PPO

## 2019-02-21 ENCOUNTER — Inpatient Hospital Stay: Payer: PPO | Attending: Oncology | Admitting: Oncology

## 2019-02-21 VITALS — BP 126/54 | HR 78 | Temp 97.6°F | Resp 19 | Ht 65.0 in | Wt 137.4 lb

## 2019-02-21 DIAGNOSIS — R109 Unspecified abdominal pain: Secondary | ICD-10-CM | POA: Insufficient documentation

## 2019-02-21 DIAGNOSIS — R110 Nausea: Secondary | ICD-10-CM

## 2019-02-21 DIAGNOSIS — Z79899 Other long term (current) drug therapy: Secondary | ICD-10-CM | POA: Diagnosis not present

## 2019-02-21 DIAGNOSIS — C221 Intrahepatic bile duct carcinoma: Secondary | ICD-10-CM | POA: Diagnosis not present

## 2019-02-21 LAB — CBC WITH DIFFERENTIAL (CANCER CENTER ONLY)
Abs Immature Granulocytes: 0.01 10*3/uL (ref 0.00–0.07)
Basophils Absolute: 0 10*3/uL (ref 0.0–0.1)
Basophils Relative: 1 %
Eosinophils Absolute: 0 10*3/uL (ref 0.0–0.5)
Eosinophils Relative: 1 %
HCT: 31.7 % — ABNORMAL LOW (ref 36.0–46.0)
Hemoglobin: 9.5 g/dL — ABNORMAL LOW (ref 12.0–15.0)
Immature Granulocytes: 0 %
Lymphocytes Relative: 16 %
Lymphs Abs: 0.5 10*3/uL — ABNORMAL LOW (ref 0.7–4.0)
MCH: 23.8 pg — ABNORMAL LOW (ref 26.0–34.0)
MCHC: 30 g/dL (ref 30.0–36.0)
MCV: 79.3 fL — ABNORMAL LOW (ref 80.0–100.0)
Monocytes Absolute: 0.3 10*3/uL (ref 0.1–1.0)
Monocytes Relative: 10 %
Neutro Abs: 2.6 10*3/uL (ref 1.7–7.7)
Neutrophils Relative %: 72 %
Platelet Count: 219 10*3/uL (ref 150–400)
RBC: 4 MIL/uL (ref 3.87–5.11)
RDW: 15.2 % (ref 11.5–15.5)
WBC Count: 3.5 10*3/uL — ABNORMAL LOW (ref 4.0–10.5)
nRBC: 0 % (ref 0.0–0.2)

## 2019-02-21 LAB — CMP (CANCER CENTER ONLY)
ALT: 13 U/L (ref 0–44)
AST: 24 U/L (ref 15–41)
Albumin: 4 g/dL (ref 3.5–5.0)
Alkaline Phosphatase: 139 U/L — ABNORMAL HIGH (ref 38–126)
Anion gap: 11 (ref 5–15)
BUN: 15 mg/dL (ref 8–23)
CO2: 27 mmol/L (ref 22–32)
Calcium: 9.8 mg/dL (ref 8.9–10.3)
Chloride: 101 mmol/L (ref 98–111)
Creatinine: 0.72 mg/dL (ref 0.44–1.00)
GFR, Est AFR Am: >60 mL/min (ref >60)
GFR, Estimated: >60 mL/min (ref >60)
Glucose, Bld: 110 mg/dL — ABNORMAL HIGH (ref 70–99)
Potassium: 3.8 mmol/L (ref 3.5–5.1)
Sodium: 139 mmol/L (ref 135–145)
Total Bilirubin: 0.3 mg/dL (ref 0.3–1.2)
Total Protein: 7.2 g/dL (ref 6.5–8.1)

## 2019-02-21 LAB — FERRITIN: Ferritin: 7 ng/mL — ABNORMAL LOW (ref 11–307)

## 2019-02-21 LAB — CEA (IN HOUSE-CHCC): CEA (CHCC-In House): <1 ng/mL (ref 0.00–5.00)

## 2019-02-21 MED ORDER — ONDANSETRON HCL 4 MG PO TABS: 4.0000 mg | ORAL_TABLET | Freq: Four times a day (QID) | ORAL | 1 refills | Status: DC | PRN

## 2019-02-21 NOTE — Progress Notes (Signed)
[facility] OFFICE PROGRESS NOTE   Diagnosis: Cholangiocarcinoma  INTERVAL HISTORY:   Ms. Szuch returns for a scheduled visit.  She complains of nausea and right abdominal pain.  The pain radiates toward the right flank.  The pain is relieved with Tylenol and tramadol.  No difficulty with bowel function.  No bleeding.  No vaginal bleeding for the past several months.  This resolved when she started clindamycin cream.  Objective:  Vital signs in last 24 hours:  Blood pressure (!) 126/54, pulse 78, temperature 97.6 F (36.4 C), temperature source Oral, resp. rate 19, height 5' 5" (1.651 m), weight 137 lb 6.4 oz (62.3 kg), SpO2 99 %.    Resp: Lungs clear bilaterally Cardio: Regular rate and rhythm GI: No hepatomegaly, no mass, tender in the right upper abdomen Vascular: No leg edema   Lab Results:  Lab Results  Component Value Date   WBC 3.2 (L) 01/16/2019   HGB 9.7 (L) 01/16/2019   HCT 32.6 (L) 01/16/2019   MCV 80.5 01/16/2019   PLT 246 01/16/2019   NEUTROABS 1.9 01/16/2019    CMP  Lab Results  Component Value Date   NA 138 01/16/2019   K 3.9 01/16/2019   CL 103 01/16/2019   CO2 25 01/16/2019   GLUCOSE 127 (H) 01/16/2019   BUN 11 01/16/2019   CREATININE 0.46 01/16/2019   CALCIUM 9.5 01/16/2019   PROT 7.3 01/16/2019   ALBUMIN 4.1 01/16/2019   AST 24 01/16/2019   ALT 14 01/16/2019   ALKPHOS 106 01/16/2019   BILITOT 0.7 01/16/2019   GFRNONAA >60 01/16/2019   GFRAA >60 01/16/2019     Medications: I have reviewed the patient's current medications.   Assessment/Plan: 1. Right liver mass-biopsy 07/10/2016 confirmed adenocarcinoma, PDL 1 0%, MSS, tumor mutation burden 3, IDH1 and PIK3 CA alterations  CT abdomen/pelvis 06/17/2016 and MRI abdomen 06/21/2016 confirmed an isolated right liver mass and a cystic pancreas lesion  PET scan 07/28/2016-the hepatic lesion is not hypermetabolic, hypermetabolic lesion in the gastric antrum suspicious for  a primary neoplasm and no other evidence of metastatic disease  Upper endoscopy 08/03/2016-no mass found, biopsy from the gastric antrum-benign  CT-guided ablation of the right liver lesion 11/03/2016  MRI abdomen 03/08/2017-ablation defect in the right liver without residual/recurrent disease  MRI liver 06/11/2017-multiple new enhancing lesions in the right hepatic lobe surrounding the ablation defect consistent with progressive cholangiocarcinoma  Y-90 radioembolization of the right liver 07/19/2017  MRI abdomen 10/09/2017-suspected progression of intrahepatic larger carcinoma within segment 6, reviewed by interventional radiology most consistent with pseudo-progression  MRI abdomen 01/08/2018- slight decrease in the segment 6 mass, no change in enhancement at the ablation bed, new tiny arterial phase area of hyperenhancement in the lateral left liver-potentially a vascular phenomena  MRI abdomen 04/03/2018- stable segment 6 mass, no change in 10 mm flash filling segment 3 lesion-potentially a vascular shunt, a porta hepatis node measures 2.2 cm compared to 1.5 cm, no new lymphadenopathy  MRI abdomen 07/03/2018 stable segment 6 liver mass, stable segment 3 arterial phase enhancing lesion stable porta hepatis adenopathy, stable pancreas cystic lesion  MRI abdomen 11/20/2018- new 2.1 cm lateral left liver lesion, new 8 mm medial left liver lesion, no change in posterior right hepatic mass, no change in necrotic hepato-duodenal ligament lymph node no change in cystic pancreas lesion  Y 90 of the left liver 01/16/2019 2. Left leg and right foot pain  Negative left leg Doppler 07/14/2016  3. Right abdominal pain-   potentially related to the dominant right liver mass  4. Anorexia/weight loss  5. 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  Anemia    Disposition: Ms. Montante has metastatic carcinoma involving the liver, most likely cholangiocarcinoma.  No other primary tumor site was identified at upper endoscopy or on a staging PET scan.  She reports a negative stool Hemoccult with Dr. Osborne Casco 2 weeks ago.  I suspect her symptoms are related to progressive disease in the liver.  We discussed treatment options including supportive care versus a trial of systemic chemotherapy.  We also discussed referring her for a clinical trial with targeted therapy directed at the [facility] mutation.  We discussed gemcitabine/cisplatin.  I reviewed potential toxicities associated with this regimen including the chance for nausea/vomiting, mucositis, alopecia, and hematologic toxicity.  We discussed the fever, rash, and pneumonitis associated with gemcitabine.  We discussed the neuropathy and nephrotoxicity associated with cisplatin.  We reviewed the need for Port-A-Cath for administration of gemcitabine.  She would like to consider treatment options further.  She will return for an office visit in 1 week.  We will obtain baseline laboratory studies today to include a CEA, CA 19-9, and ferritin level.  25 minutes were spent with the patient today.  The majority of the time was used for counseling and coordination of care.  [name], MD  02/21/2019  10:17 AM

## 2019-02-21 NOTE — Telephone Encounter (Signed)
Scheduled appt per 02/28 los. ° °Printed calendar and avs. °

## 2019-02-22 LAB — CANCER ANTIGEN 19-9: CA 19-9: 20 U/mL (ref 0–35)

## 2019-02-24 ENCOUNTER — Telehealth: Payer: Self-pay

## 2019-02-24 NOTE — Telephone Encounter (Signed)
TC to pt per Dr Benay Spice to let her know that she is iron deficient and to start ferrous sulfate 325 mg twice daily, repeat CBC when she returns next week. And that she will get stool hemoccult crad when she is here next week also. Pt wrote down and read back instructions. Pt verbalized understanding. No further problems or concerns at this time.

## 2019-02-28 ENCOUNTER — Other Ambulatory Visit: Payer: Self-pay | Admitting: *Deleted

## 2019-02-28 ENCOUNTER — Telehealth: Payer: Self-pay | Admitting: Oncology

## 2019-02-28 ENCOUNTER — Other Ambulatory Visit: Payer: Self-pay

## 2019-02-28 ENCOUNTER — Inpatient Hospital Stay: Payer: PPO | Attending: Oncology | Admitting: Oncology

## 2019-02-28 VITALS — BP 138/65 | HR 90 | Temp 97.5°F | Resp 18 | Ht 65.0 in | Wt 138.0 lb

## 2019-02-28 DIAGNOSIS — Z79899 Other long term (current) drug therapy: Secondary | ICD-10-CM | POA: Diagnosis not present

## 2019-02-28 DIAGNOSIS — R630 Anorexia: Secondary | ICD-10-CM | POA: Insufficient documentation

## 2019-02-28 DIAGNOSIS — C221 Intrahepatic bile duct carcinoma: Secondary | ICD-10-CM

## 2019-02-28 DIAGNOSIS — D509 Iron deficiency anemia, unspecified: Secondary | ICD-10-CM | POA: Diagnosis not present

## 2019-02-28 DIAGNOSIS — R110 Nausea: Secondary | ICD-10-CM

## 2019-02-28 DIAGNOSIS — R1011 Right upper quadrant pain: Secondary | ICD-10-CM | POA: Diagnosis not present

## 2019-02-28 DIAGNOSIS — Z5111 Encounter for antineoplastic chemotherapy: Secondary | ICD-10-CM | POA: Diagnosis not present

## 2019-02-28 DIAGNOSIS — Z7189 Other specified counseling: Secondary | ICD-10-CM

## 2019-02-28 MED ORDER — PROCHLORPERAZINE MALEATE 5 MG PO TABS: 5.0000 mg | ORAL_TABLET | Freq: Four times a day (QID) | ORAL | 0 refills | Status: DC | PRN

## 2019-02-28 MED ORDER — TRAMADOL-ACETAMINOPHEN 37.5-325 MG PO TABS: 1.0000 | ORAL_TABLET | Freq: Four times a day (QID) | ORAL | 0 refills | Status: DC | PRN

## 2019-02-28 MED ORDER — LIDOCAINE-PRILOCAINE 2.5-2.5 % EX CREA: 1.0000 "application " | TOPICAL_CREAM | CUTANEOUS | 1 refills | Status: DC | PRN

## 2019-02-28 NOTE — Progress Notes (Signed)
START OFF PATHWAY REGIMEN - Other Dx   [id number]:Cisplatin 25 mg/m2 IV D1,8,15 + Gemcitabine 1,000 mg/m2 IV D1,8,15 q28 Days:   A cycle is every 28 days:     Cisplatin      Gemcitabine   **Always confirm dose/schedule in your pharmacy ordering system**  Patient Characteristics: Intent of Therapy: Non-Curative / Palliative Intent, Discussed with Patient

## 2019-02-28 NOTE — Patient Instructions (Signed)

## 2019-02-28 NOTE — Telephone Encounter (Signed)
Gave avs and calendar ° °

## 2019-02-28 NOTE — Progress Notes (Signed)
[place] OFFICE PROGRESS NOTE   Diagnosis: Metastatic carcinoma  INTERVAL HISTORY:   Laura [name] returns as scheduled.  She is here today with multiple family members.  She continues to have discomfort at the right upper and lateral abdomen.  She has nausea.  She took tramadol for pain and this made her feel "woozy ".  No bleeding.  She is taking iron.  Objective:  Vital signs in last 24 hours:  Blood pressure 138/65, pulse 90, temperature (!) 97.5 F (36.4 C), resp. rate 18, height '5\' 5"'  (1.651 m), weight 138 lb (62.6 kg), SpO2 99 %.    Resp: Lungs clear bilaterally Cardio: Regular rate and rhythm GI: No hepatomegaly, tender in the right upper abdomen, no mass Vascular: No leg edema   Lab Results:  Lab Results  Component Value Date   WBC 3.5 (L) 02/21/2019   HGB 9.5 (L) 02/21/2019   HCT 31.7 (L) 02/21/2019   MCV 79.3 (L) 02/21/2019   PLT 219 02/21/2019   NEUTROABS 2.6 02/21/2019    CMP  Lab Results  Component Value Date   NA 139 02/21/2019   K 3.8 02/21/2019   CL 101 02/21/2019   CO2 27 02/21/2019   GLUCOSE 110 (H) 02/21/2019   BUN 15 02/21/2019   CREATININE 0.72 02/21/2019   CALCIUM 9.8 02/21/2019   PROT 7.2 02/21/2019   ALBUMIN 4.0 02/21/2019   AST 24 02/21/2019   ALT 13 02/21/2019   ALKPHOS 139 (H) 02/21/2019   BILITOT 0.3 02/21/2019   GFRNONAA >60 02/21/2019   GFRAA >60 02/21/2019    Lab Results  Component Value Date   CEA1 <1.00 02/21/2019     Medications: I have reviewed the patient's current medications.   Assessment/Plan: 1. Right liver mass-biopsy 07/10/2016 confirmed adenocarcinoma, PDL 1 0%, MSS, tumor mutation burden 3, IDH1 and PIK3 CA alterations  CT abdomen/pelvis 06/17/2016 and MRI abdomen 06/21/2016 confirmed an isolated right liver mass and a cystic pancreas lesion  PET scan 07/28/2016-the hepatic lesion is not hypermetabolic, hypermetabolic lesion in the gastric antrum suspicious for a primary neoplasm and  no other evidence of metastatic disease  Upper endoscopy 08/03/2016-no mass found, biopsy from the gastric antrum-benign  CT-guided ablation of the right liver lesion 11/03/2016  MRI abdomen 03/08/2017-ablation defect in the right liver without residual/recurrent disease  MRI liver 06/11/2017-multiple new enhancing lesions in the right hepatic lobe surrounding the ablation defect consistent with progressive cholangiocarcinoma  Y-90 radioembolization of the right liver 07/19/2017  MRI abdomen 10/09/2017-suspected progression of intrahepatic larger carcinoma within segment 6, reviewed by interventional radiology most consistent with pseudo-progression  MRI abdomen 01/08/2018- slight decrease in the segment 6 mass, no change in enhancement at the ablation bed, new tiny arterial phase area of hyperenhancement in the lateral left liver-potentially a vascular phenomena  MRI abdomen 04/03/2018- stable segment 6 mass, no change in 10 mm flash filling segment 3 lesion-potentially a vascular shunt, a porta hepatis node measures 2.2 cm compared to 1.5 cm, no new lymphadenopathy  MRI abdomen 07/03/2018 stable segment 6 liver mass, stable segment 3 arterial phase enhancing lesion stable porta hepatis adenopathy, stable pancreas cystic lesion  MRI abdomen 11/20/2018- new 2.1 cm lateral left liver lesion, new 8 mm medial left liver lesion, no change in posterior right hepatic mass, no change in necrotic hepato-duodenal ligament lymph node no change in cystic pancreas lesion  Y 90 of the left liver 01/16/2019 2. Left leg and right foot pain  Negative left leg Doppler 07/14/2016  3. Right abdominal pain- potentially related to the dominant right liver mass  4. Anorexia/weight loss  5. Benign appearing 16 mm cystic uncinatepancreas lesion noted on MRI 06/20/2016   Slightly larger on an MRI 03/08/2017, felt to most likely represent a side branch intraductal papillary mucinous neoplasm  Stable  on MRI 10/09/2017  Stable on MRI 01/08/2018  Stable on MRI 04/03/2018  Stable on MRI 07/03/2018  Stable on MRI 11/20/2018  6.  Anemia-iron deficiency     Disposition: Laura [name] appears unchanged.  She has clinical evidence of disease progression in the liver.  She most likely has intrahepatic cholangiocarcinoma.  There is no other apparent primary tumor site.  She is symptomatic with nausea and pain.  I discussed treatment options with Laura [name] and her family.  She would like to proceed with a trial of systemic chemotherapy.  I recommend gemcitabine/cisplatin to be given every 2 weeks. We again reviewed potential toxicities associated with this regimen.  She will attend a chemotherapy teaching class.  She agrees to proceed.  She will be referred for Port-A-Cath placement with the plan to begin chemotherapy on 03/12/2019.  She understands no therapy will be curative.  The goal of chemotherapy is to palliate her symptoms.  She has iron deficiency anemia.  This may be related to the vaginal bleeding she had last year.  She will return stool Hemoccult cards.  She underwent an upper endoscopy in 2017.  No evidence of a colonic neoplasm on the staging PET scan and the CEA is normal.  She will continue iron replacement.  She will return for an office visit prior to beginning chemotherapy on 03/12/2019.    [name], MD  02/28/2019  3:29 PM

## 2019-02-28 NOTE — Progress Notes (Signed)
Per Dr. Benay Spice: Needs Ultracet, Compazine and EMLA cream. Scripts sent to pharmacy and patient notified.

## 2019-03-02 ENCOUNTER — Other Ambulatory Visit: Payer: Self-pay | Admitting: Radiology

## 2019-03-03 ENCOUNTER — Inpatient Hospital Stay: Payer: PPO

## 2019-03-04 ENCOUNTER — Ambulatory Visit (HOSPITAL_COMMUNITY)
Admission: RE | Admit: 2019-03-04 | Discharge: 2019-03-04 | Disposition: A | Discharge status: Home / Self Care | Payer: PPO | Source: Ambulatory Visit | Attending: Oncology | Admitting: Oncology

## 2019-03-04 ENCOUNTER — Other Ambulatory Visit: Payer: Self-pay | Admitting: Oncology

## 2019-03-04 ENCOUNTER — Encounter (HOSPITAL_COMMUNITY): Payer: Self-pay

## 2019-03-04 ENCOUNTER — Other Ambulatory Visit: Payer: Self-pay

## 2019-03-04 DIAGNOSIS — Z79899 Other long term (current) drug therapy: Secondary | ICD-10-CM | POA: Diagnosis not present

## 2019-03-04 DIAGNOSIS — Z90710 Acquired absence of both cervix and uterus: Secondary | ICD-10-CM | POA: Diagnosis not present

## 2019-03-04 DIAGNOSIS — I10 Essential (primary) hypertension: Secondary | ICD-10-CM | POA: Diagnosis not present

## 2019-03-04 DIAGNOSIS — K589 Irritable bowel syndrome without diarrhea: Secondary | ICD-10-CM | POA: Diagnosis not present

## 2019-03-04 DIAGNOSIS — Z887 Allergy status to serum and vaccine status: Secondary | ICD-10-CM | POA: Diagnosis not present

## 2019-03-04 DIAGNOSIS — Z801 Family history of malignant neoplasm of trachea, bronchus and lung: Secondary | ICD-10-CM | POA: Diagnosis not present

## 2019-03-04 DIAGNOSIS — Z5111 Encounter for antineoplastic chemotherapy: Secondary | ICD-10-CM | POA: Diagnosis not present

## 2019-03-04 DIAGNOSIS — C221 Intrahepatic bile duct carcinoma: Secondary | ICD-10-CM

## 2019-03-04 DIAGNOSIS — Z8249 Family history of ischemic heart disease and other diseases of the circulatory system: Secondary | ICD-10-CM | POA: Diagnosis not present

## 2019-03-04 DIAGNOSIS — Z803 Family history of malignant neoplasm of breast: Secondary | ICD-10-CM | POA: Diagnosis not present

## 2019-03-04 DIAGNOSIS — Z885 Allergy status to narcotic agent status: Secondary | ICD-10-CM | POA: Diagnosis not present

## 2019-03-04 DIAGNOSIS — K219 Gastro-esophageal reflux disease without esophagitis: Secondary | ICD-10-CM | POA: Insufficient documentation

## 2019-03-04 DIAGNOSIS — E7800 Pure hypercholesterolemia, unspecified: Secondary | ICD-10-CM | POA: Insufficient documentation

## 2019-03-04 DIAGNOSIS — Z7982 Long term (current) use of aspirin: Secondary | ICD-10-CM | POA: Insufficient documentation

## 2019-03-04 DIAGNOSIS — R7303 Prediabetes: Secondary | ICD-10-CM | POA: Insufficient documentation

## 2019-03-04 DIAGNOSIS — Z452 Encounter for adjustment and management of vascular access device: Secondary | ICD-10-CM | POA: Diagnosis not present

## 2019-03-04 HISTORY — PX: IR IMAGING GUIDED PORT INSERTION: IMG5740

## 2019-03-04 LAB — CBC WITH DIFFERENTIAL/PLATELET
Abs Immature Granulocytes: 0 10*3/uL (ref 0.00–0.07)
Basophils Absolute: 0 10*3/uL (ref 0.0–0.1)
Basophils Relative: 1 %
Eosinophils Absolute: 0.1 10*3/uL (ref 0.0–0.5)
Eosinophils Relative: 1 %
HCT: 32.8 % — ABNORMAL LOW (ref 36.0–46.0)
Hemoglobin: 10 g/dL — ABNORMAL LOW (ref 12.0–15.0)
Immature Granulocytes: 0 %
Lymphocytes Relative: 15 %
Lymphs Abs: 0.5 10*3/uL — ABNORMAL LOW (ref 0.7–4.0)
MCH: 25.2 pg — ABNORMAL LOW (ref 26.0–34.0)
MCHC: 30.5 g/dL (ref 30.0–36.0)
MCV: 82.6 fL (ref 80.0–100.0)
Monocytes Absolute: 0.4 10*3/uL (ref 0.1–1.0)
Monocytes Relative: 13 %
Neutro Abs: 2.4 10*3/uL (ref 1.7–7.7)
Neutrophils Relative %: 70 %
Platelets: 246 10*3/uL (ref 150–400)
RBC: 3.97 MIL/uL (ref 3.87–5.11)
RDW: 18.6 % — ABNORMAL HIGH (ref 11.5–15.5)
WBC: 3.5 10*3/uL — ABNORMAL LOW (ref 4.0–10.5)
nRBC: 0 % (ref 0.0–0.2)

## 2019-03-04 LAB — PROTIME-INR
INR: 0.9 (ref 0.8–1.2)
Prothrombin Time: 12 seconds (ref 11.4–15.2)

## 2019-03-04 MED ORDER — LIDOCAINE-EPINEPHRINE (PF) 2 %-1:200000 IJ SOLN
INTRAMUSCULAR | Status: AC
  Filled 2019-03-04: qty 20

## 2019-03-04 MED ORDER — CEFAZOLIN SODIUM-DEXTROSE 2-4 GM/100ML-% IV SOLN
INTRAVENOUS | Status: AC
  Administered 2019-03-04: 2 g via INTRAVENOUS
  Filled 2019-03-04: qty 100

## 2019-03-04 MED ORDER — HEPARIN SOD (PORK) LOCK FLUSH 100 UNIT/ML IV SOLN
INTRAVENOUS | Status: AC
  Filled 2019-03-04: qty 5

## 2019-03-04 MED ORDER — HEPARIN SOD (PORK) LOCK FLUSH 100 UNIT/ML IV SOLN
INTRAVENOUS | Status: AC | PRN
  Administered 2019-03-04: 500 [IU] via INTRAVENOUS

## 2019-03-04 MED ORDER — SODIUM CHLORIDE 0.9 % IV SOLN
INTRAVENOUS | Status: DC
  Administered 2019-03-04: 11:00:00 via INTRAVENOUS

## 2019-03-04 MED ORDER — CEFAZOLIN SODIUM-DEXTROSE 2-4 GM/100ML-% IV SOLN
2.0000 g | INTRAVENOUS | Status: AC
  Administered 2019-03-04: 2 g via INTRAVENOUS

## 2019-03-04 MED ORDER — LIDOCAINE-EPINEPHRINE (PF) 1 %-1:200000 IJ SOLN
INTRAMUSCULAR | Status: AC | PRN
  Administered 2019-03-04: 5 mL

## 2019-03-04 MED ORDER — MIDAZOLAM HCL 2 MG/2ML IJ SOLN
INTRAMUSCULAR | Status: AC
  Filled 2019-03-04: qty 2

## 2019-03-04 MED ORDER — FENTANYL CITRATE (PF) 100 MCG/2ML IJ SOLN
INTRAMUSCULAR | Status: AC | PRN
  Administered 2019-03-04: 50 ug via INTRAVENOUS

## 2019-03-04 MED ORDER — MIDAZOLAM HCL 2 MG/2ML IJ SOLN
INTRAMUSCULAR | Status: AC | PRN
  Administered 2019-03-04: 1 mg via INTRAVENOUS

## 2019-03-04 MED ORDER — FENTANYL CITRATE (PF) 100 MCG/2ML IJ SOLN
INTRAMUSCULAR | Status: AC
  Filled 2019-03-04: qty 2

## 2019-03-04 MED ORDER — ONDANSETRON HCL 4 MG/2ML IJ SOLN
INTRAMUSCULAR | Status: AC
  Filled 2019-03-04: qty 2

## 2019-03-04 MED ORDER — LIDOCAINE-EPINEPHRINE (PF) 1 %-1:200000 IJ SOLN
INTRAMUSCULAR | Status: AC | PRN
  Administered 2019-03-04: 10 mL

## 2019-03-04 NOTE — Discharge Instructions (Signed)
Implanted Port Home Guide °An implanted port is a device that is placed under the skin. It is usually placed in the chest. The device can be used to give IV medicine, to take blood, or for dialysis. You may have an implanted port if: °· You need IV medicine that would be irritating to the small veins in your hands or arms. °· You need IV medicines, such as antibiotics, for a long period of time. °· You need IV nutrition for a long period of time. °· You need dialysis. °Having a port means that your health care provider will not need to use the veins in your arms for these procedures. You may have fewer limitations when using a port than you would if you used other types of long-term IVs, and you will likely be able to return to normal activities after your incision heals. °An implanted port has two main parts: °· Reservoir. The reservoir is the part where a needle is inserted to give medicines or draw blood. The reservoir is round. After it is placed, it appears as a small, raised area under your skin. °· Catheter. The catheter is a thin, flexible tube that connects the reservoir to a vein. Medicine that is inserted into the reservoir goes into the catheter and then into the vein. °How is my port accessed? °To access your port: °· A numbing cream may be placed on the skin over the port site. °· Your health care provider will put on a mask and sterile gloves. °· The skin over your port will be cleaned carefully with a germ-killing soap and allowed to dry. °· Your health care provider will gently pinch the port and insert a needle into it. °· Your health care provider will check for a blood return to make sure the port is in the vein and is not clogged. °· If your port needs to remain accessed to get medicine continuously (constant infusion), your health care provider will place a clear bandage (dressing) over the needle site. The dressing and needle will need to be changed every week, or as told by your health care  provider. °What is flushing? °Flushing helps keep the port from getting clogged. Follow instructions from your health care provider about how and when to flush the port. Ports are usually flushed with saline solution or a medicine called heparin. The need for flushing will depend on how the port is used: °· If the port is only used from time to time to give medicines or draw blood, the port may need to be flushed: °? Before and after medicines have been given. °? Before and after blood has been drawn. °? As part of routine maintenance. Flushing may be recommended every 4-6 weeks. °· If a constant infusion is running, the port may not need to be flushed. °· Throw away any syringes in a disposal container that is meant for sharp items (sharps container). You can buy a sharps container from a pharmacy, or you can make one by using an empty hard plastic bottle with a cover. °How long will my port stay implanted? °The port can stay in for as long as your health care provider thinks it is needed. When it is time for the port to come out, a surgery will be done to remove it. The surgery will be similar to the procedure that was done to put the port in. °Follow these instructions at home: ° °· Flush your port as told by your health care provider. °·   If you need an infusion over several days, follow instructions from your health care provider about how to take care of your port site. Make sure you: °? Wash your hands with soap and water before you change your dressing. If soap and water are not available, use alcohol-based hand sanitizer. °? Change your dressing as told by your health care provider. °? Place any used dressings or infusion bags into a plastic bag. Throw that bag in the trash. °? Keep the dressing that covers the needle clean and dry. Do not get it wet. °? Do not use scissors or sharp objects near the tube. °? Keep the tube clamped, unless it is being used. °· Check your port site every day for signs of  infection. Check for: °? Redness, swelling, or pain. °? Fluid or blood. °? Pus or a bad smell. °· Protect the skin around the port site. °? Avoid wearing bra straps that rub or irritate the site. °? Protect the skin around your port from seat belts. Place a soft pad over your chest if needed. °· Bathe or shower as told by your health care provider. The site may get wet as long as you are not actively receiving an infusion. °· Return to your normal activities as told by your health care provider. Ask your health care provider what activities are safe for you. °· Carry a medical alert card or wear a medical alert bracelet at all times. This will let health care providers know that you have an implanted port in case of an emergency. °Get help right away if: °· You have redness, swelling, or pain at the port site. °· You have fluid or blood coming from your port site. °· You have pus or a bad smell coming from the port site. °· You have a fever. °Summary °· Implanted ports are usually placed in the chest for long-term IV access. °· Follow instructions from your health care provider about flushing the port and changing bandages (dressings). °· Take care of the area around your port by avoiding clothing that puts pressure on the area, and by watching for signs of infection. °· Protect the skin around your port from seat belts. Place a soft pad over your chest if needed. °· Get help right away if you have a fever or you have redness, swelling, pain, drainage, or a bad smell at the port site. °This information is not intended to replace advice given to you by your health care provider. Make sure you discuss any questions you have with your health care provider. °Document Released: 12/11/2005 Document Revised: 01/13/2017 Document Reviewed: 01/13/2017 °Elsevier Interactive Patient Education © 2019 [employer] °Moderate Conscious Sedation, Adult, Care After °These instructions provide you with information about caring for  yourself after your procedure. Your health care provider may also give you more specific instructions. Your treatment has been planned according to current medical practices, but problems sometimes occur. Call your health care provider if you have any problems or questions after your procedure. °What can I expect after the procedure? °After your procedure, it is common: °· To feel sleepy for several hours. °· To feel clumsy and have poor balance for several hours. °· To have poor judgment for several hours. °· To vomit if you eat too soon. °Follow these instructions at home: °For at least 24 hours after the procedure: ° °· Do not: °? Participate in activities where you could fall or become injured. °? Drive. °? Use heavy machinery. °? Drink   alcohol. °? Take sleeping pills or medicines that cause drowsiness. °? Make important decisions or sign legal documents. °? Take care of children on your own. °· Rest. °Eating and drinking °· Follow the diet recommended by your health care provider. °· If you vomit: °? Drink water, juice, or soup when you can drink without vomiting. °? Make sure you have little or no nausea before eating solid foods. °General instructions °· Have a responsible adult stay with you until you are awake and alert. °· Take over-the-counter and prescription medicines only as told by your health care provider. °· If you smoke, do not smoke without supervision. °· Keep all follow-up visits as told by your health care provider. This is important. °Contact a health care provider if: °· You keep feeling nauseous or you keep vomiting. °· You feel light-headed. °· You develop a rash. °· You have a fever. °Get help right away if: °· You have trouble breathing. °This information is not intended to replace advice given to you by your health care provider. Make sure you discuss any questions you have with your health care provider. °Document Released: 10/01/2013 Document Revised: 05/15/2016 Document Reviewed:  04/01/2016 °Elsevier Interactive Patient Education © 2019 [employer] Inc. ° °

## 2019-03-04 NOTE — H&P (Signed)
Chief Complaint: Patient was seen in consultation today for  at the request of Sherrill,Gary B  Referring Physician(s): Ladell Pier  Supervising Physician: Daryll Brod  Patient Status: [facility] - Out-pt  History of Present Illness: Laura [name] is a 78 y.o. female with metastatic cholangiocarcinoma. She is well known to IR service with prior Y90 radioembolization treatments. She is now referred for port as she is to start palliative chemotherapy. PMHx, meds, labs, imaging, allergies reviewed. Feels well, no recent fevers, chills, illness. Has been NPO today as directed. Family at bedside.   Past Medical History:  Diagnosis Date  . Cancer ([place])   . GERD (gastroesophageal reflux disease)   . Heart murmur   . High cholesterol   . Hypertension   . IBS (irritable bowel syndrome)   . PONV (postoperative nausea and vomiting)   . Pre-diabetes     Past Surgical History:  Procedure Laterality Date  . BREAST EXCISIONAL BIOPSY Right 1960   scar is not visible   . BREAST SURGERY     Benign  . CHOLECYSTECTOMY    . IR EMBO ARTERIAL NOT HEMORR HEMANG INC GUIDE ROADMAPPING  07/05/2017  . IR EMBO TUMOR ORGAN ISCHEMIA INFARCT INC GUIDE ROADMAPPING  07/19/2017  . IR EMBO TUMOR ORGAN ISCHEMIA INFARCT INC GUIDE ROADMAPPING  01/16/2019  . IR GENERIC HISTORICAL  09/27/2016   IR RADIOLOGIST EVAL & MGMT 09/27/2016 Greggory Keen, MD [facility] INTERV RAD  . IR GENERIC HISTORICAL  12/07/2016   IR RADIOLOGIST EVAL & MGMT 12/07/2016 Greggory Keen, MD [facility] INTERV RAD  . TOTAL ABDOMINAL HYSTERECTOMY      Allergies: Pneumococcal vaccines and Codeine  Medications: Prior to Admission medications   Medication Sig Start Date End Date Taking? Authorizing Provider  acetaminophen (TYLENOL) 500 MG tablet Take 1,000 mg by mouth every 6 (six) hours as needed for mild pain.   Yes [provider]  amLODipine (NORVASC) 5 MG tablet Take 5 mg by mouth daily.  06/22/14  Yes [provider]  aspirin EC 81 MG tablet Take 81 mg by mouth daily.   Yes [provider]  Biotin 5000 MCG TABS Take 5,000 mcg by mouth daily.    Yes [provider]  Cholecalciferol (VITAMIN D3) 5000 units CAPS Take 5,000 Units by mouth daily.   Yes [provider]  clonazePAM (KLONOPIN) 0.5 MG tablet Take 0.5-1 tablets (0.25-0.5 mg total) by mouth 3 (three) times daily as needed for anxiety. Take 0.25mg s twice daily as needed for anxiety and 0.5mg s at bedtime for sleep as needed 01/23/19  Yes Ladell Pier, MD  docusate sodium (COLACE) 50 MG capsule Take 50 mg by mouth daily.    Yes [provider]  doxazosin (CARDURA) 1 MG tablet Take 1 mg by mouth at bedtime.  06/22/14  Yes [provider]  ferrous sulfate 325 (65 FE) MG tablet Take 650 mg by mouth daily with breakfast.   Yes [provider]  omeprazole (PRILOSEC) 20 MG capsule Take 20 mg by mouth 2 (two) times daily. 06/25/17  Yes [provider]  polyethylene glycol (MIRALAX / GLYCOLAX) packet Take 17 g by mouth daily as needed for mild constipation.   Yes [provider]  pravastatin (PRAVACHOL) 40 MG tablet Take 40 mg by mouth every evening.    Yes [provider]  tetrahydrozoline 0.05 % ophthalmic solution Place 1 drop into both eyes daily as needed (dry eye).    Yes [provider]  clindamycin (CLEOCIN)  2 % vaginal cream Place 1 Applicatorful vaginally 2 (two) times a week.  08/29/18   [provider]  lidocaine-prilocaine (EMLA) cream Apply 1 application topically as needed. 02/28/19   Ladell Pier, MD  Multiple Vitamins-Minerals (CENTRUM SILVER ADULT 50+ PO) Take 1 tablet by mouth.    [provider]  ondansetron (ZOFRAN) 4 MG tablet Take 1 tablet (4 mg total) by mouth every 6 (six) hours as needed. for nausea 02/21/19   Ladell Pier, MD  prochlorperazine (COMPAZINE) 5 MG tablet Take 1-2 tablets (5-10 mg total) by mouth every 6 (six) hours  as needed for nausea or vomiting. 02/28/19   Ladell Pier, MD  traMADol-acetaminophen (ULTRACET) 37.5-325 MG tablet Take 1 tablet by mouth every 6 (six) hours as needed. 02/28/19   Ladell Pier, MD     Family History  Problem Relation Age of Onset  . Alzheimer's disease Mother   . Alzheimer's disease Brother   . Alzheimer's disease Sister   . Breast cancer Sister   . CAD Brother 22       CABG  . Cancer Sister        Breast  . Cancer Brother        Lung    Social History   Socioeconomic History  . Marital status: Widowed    Spouse name: Lanae Boast  . Number of children: 3  . Years of education: Not on file  . Highest education level: Not on file  Occupational History  . Occupation: Retired  Scientific laboratory technician  . Financial resource strain: Not on file  . Food insecurity:    Worry: Not on file    Inability: Not on file  . Transportation needs:    Medical: Not on file    Non-medical: Not on file  Tobacco Use  . Smoking status: Never Smoker  . Smokeless tobacco: Never Used  Substance and Sexual Activity  . Alcohol use: No  . Drug use: No  . Sexual activity: Not on file  Lifestyle  . Physical activity:    Days per week: Not on file    Minutes per session: Not on file  . Stress: Not on file  Relationships  . Social connections:    Talks on phone: Not on file    Gets together: Not on file    Attends religious service: Not on file    Active member of club or organization: Not on file    Attends meetings of clubs or organizations: Not on file    Relationship status: Not on file  Other Topics Concern  . Not on file  Social History Narrative   Lives at home with husband, Lanae Boast who is in poor health (she is caregiver)   Has #3 daughters   Retired from working at [employer]   Has total of #7 siblings     Review of Systems: A 12 point ROS discussed and pertinent positives are indicated in the HPI above.  All other systems are negative.  Review of  Systems  Vital Signs: Temp: 97.8, HR: 84, RR: 14, BP: 153/68  Physical Exam Constitutional:      Appearance: Normal appearance.  HENT:     Head: Normocephalic and atraumatic.     Mouth/Throat:     Mouth: Mucous membranes are moist.     Pharynx: Oropharynx is clear.  Cardiovascular:     Rate and Rhythm: Normal rate and regular rhythm.     Heart sounds: Normal heart sounds.  Pulmonary:     Effort: Pulmonary effort is normal. No respiratory distress.     Breath sounds: Normal breath sounds.  Skin:    General: Skin is warm and dry.  Neurological:     General: No focal deficit present.     Mental Status: She is alert and oriented to person, place, and time.  Psychiatric:        Mood and Affect: Mood normal.        Judgment: Judgment normal.      Labs:  CBC: Recent Labs    01/03/19 0823 01/16/19 0816 02/21/19 1106 03/04/19 1043  WBC 3.4* 3.2* 3.5* 3.5*  HGB 9.8* 9.7* 9.5* 10.0*  HCT 32.3* 32.6* 31.7* 32.8*  PLT 258 246 219 246    COAGS: Recent Labs    01/03/19 0823 01/16/19 0817 03/04/19 1043  INR 0.94 0.93 0.9    BMP: Recent Labs    08/24/18 1307 11/20/18 0706 01/03/19 0823 01/16/19 0816 02/21/19 1106  NA 137  --  139 138 139  K 3.3*  --  3.7 3.9 3.8  CL 104  --  104 103 101  CO2 24  --  26 25 27   GLUCOSE 156*  --  119* 127* 110*  BUN 11  --  14 11 15   CALCIUM 9.2  --  9.6 9.5 9.8  CREATININE 0.59 0.60 0.49 0.46 0.72  GFRNONAA >60  --  >60 >60 >60  GFRAA >60  --  >60 >60 >60    LIVER FUNCTION TESTS: Recent Labs    01/03/19 0823 01/16/19 0816 02/21/19 1106  BILITOT 0.5 0.7 0.3  AST 21 24 24   ALT 14 14 13   ALKPHOS 111 106 139*  PROT 7.4 7.3 7.2  ALBUMIN 4.4 4.1 4.0    TUMOR MARKERS: No results for input(s): AFPTM, CEA, CA199, CHROMGRNA in the last 8760 hours.  Assessment and Plan: Metastatic cholangiocarcinoma For Port placement Labs ok Risks and benefits of image guided port-a-catheter placement was discussed with the patient  including, but not limited to bleeding, infection, pneumothorax, or fibrin sheath development and need for additional procedures.  All of the patient's questions were answered, patient is agreeable to proceed. Consent signed and in chart.    Thank you for this interesting consult.  I greatly enjoyed meeting LORILEI HORAN and look forward to participating in their care.  A copy of this report was sent to the requesting provider on this date.  Electronically Signed: Ascencion Dike, PA-C 03/04/2019, 11:25 AM   I spent a total of 20 minutes in face to face in clinical consultation, greater than 50% of which was counseling/coordinating care for port placement

## 2019-03-04 NOTE — Procedures (Signed)
cholangioca  S/p RT IJ POWER PORT  Tip svcra No comp Stable EBL min Ready for use Full report in pacs

## 2019-03-07 ENCOUNTER — Inpatient Hospital Stay: Payer: PPO

## 2019-03-07 DIAGNOSIS — Z5111 Encounter for antineoplastic chemotherapy: Secondary | ICD-10-CM | POA: Diagnosis not present

## 2019-03-07 DIAGNOSIS — C221 Intrahepatic bile duct carcinoma: Secondary | ICD-10-CM

## 2019-03-07 LAB — OCCULT BLOOD X 1 CARD TO LAB, STOOL
Fecal Occult Bld: NEGATIVE
Fecal Occult Bld: NEGATIVE
Fecal Occult Bld: NEGATIVE

## 2019-03-09 ENCOUNTER — Other Ambulatory Visit: Payer: Self-pay | Admitting: Oncology

## 2019-03-10 ENCOUNTER — Telehealth: Payer: Self-pay | Admitting: *Deleted

## 2019-03-11 ENCOUNTER — Inpatient Hospital Stay (HOSPITAL_BASED_OUTPATIENT_CLINIC_OR_DEPARTMENT_OTHER): Payer: PPO | Admitting: Nurse Practitioner

## 2019-03-11 ENCOUNTER — Other Ambulatory Visit: Payer: Self-pay

## 2019-03-11 ENCOUNTER — Inpatient Hospital Stay: Payer: PPO

## 2019-03-11 ENCOUNTER — Encounter: Payer: Self-pay | Admitting: Oncology

## 2019-03-11 ENCOUNTER — Encounter: Payer: Self-pay | Admitting: Nurse Practitioner

## 2019-03-11 VITALS — BP 140/61 | HR 80 | Temp 98.4°F | Resp 18 | Ht 65.0 in | Wt 135.9 lb

## 2019-03-11 DIAGNOSIS — R630 Anorexia: Secondary | ICD-10-CM

## 2019-03-11 DIAGNOSIS — C221 Intrahepatic bile duct carcinoma: Secondary | ICD-10-CM | POA: Diagnosis not present

## 2019-03-11 DIAGNOSIS — Z79899 Other long term (current) drug therapy: Secondary | ICD-10-CM

## 2019-03-11 DIAGNOSIS — R1011 Right upper quadrant pain: Secondary | ICD-10-CM | POA: Diagnosis not present

## 2019-03-11 DIAGNOSIS — R110 Nausea: Secondary | ICD-10-CM

## 2019-03-11 DIAGNOSIS — Z5111 Encounter for antineoplastic chemotherapy: Secondary | ICD-10-CM | POA: Diagnosis not present

## 2019-03-11 DIAGNOSIS — Z95828 Presence of other vascular implants and grafts: Secondary | ICD-10-CM

## 2019-03-11 DIAGNOSIS — D509 Iron deficiency anemia, unspecified: Secondary | ICD-10-CM

## 2019-03-11 LAB — CBC WITH DIFFERENTIAL (CANCER CENTER ONLY)
Abs Immature Granulocytes: 0.01 10*3/uL (ref 0.00–0.07)
BASOS PCT: 1 %
Basophils Absolute: 0 10*3/uL (ref 0.0–0.1)
Eosinophils Absolute: 0 10*3/uL (ref 0.0–0.5)
Eosinophils Relative: 1 %
HCT: 33.2 % — ABNORMAL LOW (ref 36.0–46.0)
Hemoglobin: 10.2 g/dL — ABNORMAL LOW (ref 12.0–15.0)
Immature Granulocytes: 0 %
Lymphocytes Relative: 24 %
Lymphs Abs: 0.7 10*3/uL (ref 0.7–4.0)
MCH: 25.9 pg — ABNORMAL LOW (ref 26.0–34.0)
MCHC: 30.7 g/dL (ref 30.0–36.0)
MCV: 84.3 fL (ref 80.0–100.0)
MONOS PCT: 13 %
Monocytes Absolute: 0.4 10*3/uL (ref 0.1–1.0)
Neutro Abs: 1.8 10*3/uL (ref 1.7–7.7)
Neutrophils Relative %: 61 %
Platelet Count: 198 10*3/uL (ref 150–400)
RBC: 3.94 MIL/uL (ref 3.87–5.11)
RDW: 20.1 % — ABNORMAL HIGH (ref 11.5–15.5)
WBC Count: 3 10*3/uL — ABNORMAL LOW (ref 4.0–10.5)
nRBC: 0 % (ref 0.0–0.2)

## 2019-03-11 LAB — CMP (CANCER CENTER ONLY)
ALT: 19 U/L (ref 0–44)
ANION GAP: 12 (ref 5–15)
AST: 27 U/L (ref 15–41)
Albumin: 3.9 g/dL (ref 3.5–5.0)
Alkaline Phosphatase: 136 U/L — ABNORMAL HIGH (ref 38–126)
BUN: 9 mg/dL (ref 8–23)
CO2: 24 mmol/L (ref 22–32)
Calcium: 9.6 mg/dL (ref 8.9–10.3)
Chloride: 105 mmol/L (ref 98–111)
Creatinine: 0.64 mg/dL (ref 0.44–1.00)
GFR, Est AFR Am: >60 mL/min (ref >60)
GFR, Estimated: >60 mL/min (ref >60)
Glucose, Bld: 93 mg/dL (ref 70–99)
Potassium: 3.9 mmol/L (ref 3.5–5.1)
Sodium: 141 mmol/L (ref 135–145)
Total Bilirubin: 0.4 mg/dL (ref 0.3–1.2)
Total Protein: 7.1 g/dL (ref 6.5–8.1)

## 2019-03-11 LAB — MAGNESIUM: Magnesium: 2.1 mg/dL (ref 1.7–2.4)

## 2019-03-11 MED ORDER — SODIUM CHLORIDE 0.9% FLUSH
10.0000 mL | Freq: Once | INTRAVENOUS | Status: AC
  Administered 2019-03-11: 10 mL
  Filled 2019-03-11: qty 10

## 2019-03-11 MED ORDER — HEPARIN SOD (PORK) LOCK FLUSH 100 UNIT/ML IV SOLN
500.0000 [IU] | Freq: Once | INTRAVENOUS | Status: AC
  Administered 2019-03-11: 500 [IU]
  Filled 2019-03-11: qty 5

## 2019-03-11 NOTE — Progress Notes (Signed)
[place] OFFICE PROGRESS NOTE   Diagnosis: Metastatic carcinoma  INTERVAL HISTORY:   Laura [name] returns as scheduled.  She is overall feeling better.  Abdominal pain has improved.  She takes tramadol if needed which has been effective.  Nausea is better as well.  She has Zofran and Compazine at home for as needed use.  Objective:  Vital signs in last 24 hours:  Blood pressure 140/61, pulse 80, temperature 98.4 F (36.9 C), temperature source Oral, resp. rate 18, height '5\' 5"'  (1.651 m), weight 135 lb 14.4 oz (61.6 kg), SpO2 98 %.    HEENT: No thrush or ulcers. Resp: Lungs clear bilaterally. Cardio: Regular rate and rhythm. GI: Abdomen soft and nontender.  No hepatomegaly. Vascular: No leg edema. Port-A-Cath without erythema.  Lab Results:  Lab Results  Component Value Date   WBC 3.0 (L) 03/11/2019   HGB 10.2 (L) 03/11/2019   HCT 33.2 (L) 03/11/2019   MCV 84.3 03/11/2019   PLT 198 03/11/2019   NEUTROABS 1.8 03/11/2019    Imaging:  No results found.  Medications: I have reviewed the patient's current medications.  Assessment/Plan: 1. Right liver mass-biopsy 07/10/2016 confirmed adenocarcinoma, PDL 1 0%, MSS, tumor mutation burden 3, IDH1 and PIK3 CA alterations  CT abdomen/pelvis 06/17/2016 and MRI abdomen 06/21/2016 confirmed an isolated right liver mass and a cystic pancreas lesion  PET scan 07/28/2016-the hepatic lesion is not hypermetabolic, hypermetabolic lesion in the gastric antrum suspicious for a primary neoplasm and no other evidence of metastatic disease  Upper endoscopy 08/03/2016-no mass found, biopsy from the gastric antrum-benign  CT-guided ablation of the right liver lesion 11/03/2016  MRI abdomen 03/08/2017-ablation defect in the right liver without residual/recurrent disease  MRI liver 06/11/2017-multiple new enhancing lesions in the right hepatic lobe surrounding the ablation defect consistent with progressive  cholangiocarcinoma  Y-90 radioembolization of the right liver 07/19/2017  MRI abdomen 10/09/2017-suspected progression of intrahepatic larger carcinoma within segment 6, reviewed by interventional radiology most consistent with pseudo-progression  MRI abdomen 01/08/2018-slight decrease in the segment 6 mass, no change in enhancement at the ablation bed, new tiny arterial phase area of hyperenhancement in the lateral left liver-potentially a vascular phenomena  MRI abdomen 04/03/2018- stable segment 6 mass, no change in 10 mm flash filling segment 3 lesion-potentially a vascular shunt, a porta hepatis node measures 2.2 cm compared to 1.5 cm, no new lymphadenopathy  MRI abdomen 07/03/2018 stable segment 6 liver mass, stable segment 3 arterial phase enhancing lesion stable porta hepatis adenopathy, stable pancreas cystic lesion  MRI abdomen 11/20/2018- new 2.1 cm lateral left liver lesion, new 8 mm medial left liver lesion, no change in posterior right hepatic mass, no change in necrotic hepato-duodenal ligament lymph node no change in cystic pancreas lesion  Y 90 of the left liver 01/16/2019 2. Left leg and right foot pain  Negative left leg Doppler 07/14/2016  Cycle 1 gemcitabine/cisplatin 03/12/2019  3. Right abdominal pain- potentially related to the dominant right liver mass  4. Anorexia/weight loss  5. Benign appearing 16 mm cystic uncinatepancreas lesion noted on MRI 06/20/2016   Slightly larger on an MRI 03/08/2017, felt to most likely represent a side branch intraductal papillary mucinous neoplasm  Stable on MRI 10/09/2017  Stable on MRI 01/08/2018  Stable on MRI 04/03/2018  Stable on MRI 07/03/2018  Stable on MRI 11/20/2018  6.  Anemia-iron deficiency  7.  Port-A-Cath placement interventional radiology 03/04/2019  Disposition: [name] appears stable.  She is scheduled for cycle  1 gemcitabine/cisplatin tomorrow.  She has attended the chemotherapy education  class.  Potential toxicities again reviewed and questions answered.  We will see her in follow-up in 2 weeks, prior to cycle 2.  She will contact the office in the interim with any problems.  Plan reviewed with Dr. Benay Spice.    Ned Card ANP/GNP-BC   03/11/2019  3:17 PM

## 2019-03-11 NOTE — Progress Notes (Signed)
Met with patient and sister to introduce myself as Arboriculturist and to offer available resources.  Discussed the one-time $66 [place] and qualifications to assist with personal expenses while going through treatment.  They state they can bring patient's proof of income tomorrow 03/12/19 to apply for grant.  Gave my card for any additional financial questions or concerns.

## 2019-03-12 ENCOUNTER — Encounter: Payer: Self-pay | Admitting: Oncology

## 2019-03-12 ENCOUNTER — Inpatient Hospital Stay: Payer: PPO

## 2019-03-12 ENCOUNTER — Other Ambulatory Visit: Payer: Self-pay

## 2019-03-12 VITALS — BP 156/68 | HR 85 | Temp 98.1°F | Resp 18

## 2019-03-12 DIAGNOSIS — Z5111 Encounter for antineoplastic chemotherapy: Secondary | ICD-10-CM | POA: Diagnosis not present

## 2019-03-12 DIAGNOSIS — C221 Intrahepatic bile duct carcinoma: Secondary | ICD-10-CM

## 2019-03-12 MED ORDER — SODIUM CHLORIDE 0.9% FLUSH
10.0000 mL | INTRAVENOUS | Status: DC | PRN
  Administered 2019-03-12: 10 mL
  Filled 2019-03-12: qty 10

## 2019-03-12 MED ORDER — POTASSIUM CHLORIDE 2 MEQ/ML IV SOLN
Freq: Once | INTRAVENOUS | Status: AC
  Administered 2019-03-12: 11:00:00 via INTRAVENOUS
  Filled 2019-03-12: qty 10

## 2019-03-12 MED ORDER — SODIUM CHLORIDE 0.9 % IV SOLN
25.0000 mg/m2 | Freq: Once | INTRAVENOUS | Status: AC
  Administered 2019-03-12: 42 mg via INTRAVENOUS
  Filled 2019-03-12: qty 42

## 2019-03-12 MED ORDER — SODIUM CHLORIDE 0.9 % IV SOLN
Freq: Once | INTRAVENOUS | Status: AC
  Administered 2019-03-12: 13:00:00 via INTRAVENOUS
  Filled 2019-03-12: qty 5

## 2019-03-12 MED ORDER — HEPARIN SOD (PORK) LOCK FLUSH 100 UNIT/ML IV SOLN
500.0000 [IU] | Freq: Once | INTRAVENOUS | Status: AC | PRN
  Administered 2019-03-12: 500 [IU]
  Filled 2019-03-12: qty 5

## 2019-03-12 MED ORDER — SODIUM CHLORIDE 0.9 % IV SOLN
Freq: Once | INTRAVENOUS | Status: AC
  Administered 2019-03-12: 11:00:00 via INTRAVENOUS
  Filled 2019-03-12: qty 250

## 2019-03-12 MED ORDER — PALONOSETRON HCL INJECTION 0.25 MG/5ML
0.2500 mg | Freq: Once | INTRAVENOUS | Status: AC
  Administered 2019-03-12: 0.25 mg via INTRAVENOUS

## 2019-03-12 MED ORDER — SODIUM CHLORIDE 0.9 % IV SOLN
800.0000 mg/m2 | Freq: Once | INTRAVENOUS | Status: AC
  Administered 2019-03-12: 1368 mg via INTRAVENOUS
  Filled 2019-03-12: qty 35.98

## 2019-03-12 MED ORDER — PALONOSETRON HCL INJECTION 0.25 MG/5ML
INTRAVENOUS | Status: AC
  Filled 2019-03-12: qty 5

## 2019-03-12 NOTE — Progress Notes (Signed)
Patient came to bring proof of income for one-time $700 [place].   Patient approved for the grant. She has a copy of the approval letter as well as the expense sheet along with the Outpatient pharmacy information. Discussed in detail the expenses and how they are covered as well as how long the grant is good for. She verbalized understanding. She was accompanied by her daughter.  She has my card for any additional financial questions or concerns and has no needs today.

## 2019-03-12 NOTE — Patient Instructions (Signed)
Gemcitabine injection What is this medicine? GEMCITABINE (jem SYE ta been) is a chemotherapy drug. This medicine is used to treat many types of cancer like breast cancer, lung cancer, pancreatic cancer, and ovarian cancer. This medicine may be used for other purposes; ask your health care provider or pharmacist if you have questions. COMMON BRAND NAME(S): Gemzar, Infugem What should I tell my health care provider before I take this medicine? They need to know if you have any of these conditions: -blood disorders -infection -kidney disease -liver disease -lung or breathing disease, like asthma -recent or ongoing radiation therapy -an unusual or allergic reaction to gemcitabine, other chemotherapy, other medicines, foods, dyes, or preservatives -pregnant or trying to get pregnant -breast-feeding How should I use this medicine? This drug is given as an infusion into a vein. It is administered in a hospital or clinic by a specially trained health care professional. Talk to your pediatrician regarding the use of this medicine in children. Special care may be needed. Overdosage: If you think you have taken too much of this medicine contact a poison control center or emergency room at once. NOTE: This medicine is only for you. Do not share this medicine with others. What if I miss a dose? It is important not to miss your dose. Call your doctor or health care professional if you are unable to keep an appointment. What may interact with this medicine? -medicines to increase blood counts like filgrastim, pegfilgrastim, sargramostim -some other chemotherapy drugs like cisplatin -vaccines Talk to your doctor or health care professional before taking any of these medicines: -acetaminophen -aspirin -ibuprofen -ketoprofen -naproxen This list may not describe all possible interactions. Give your health care provider a list of all the medicines, herbs, non-prescription drugs, or dietary supplements  you use. Also tell them if you smoke, drink alcohol, or use illegal drugs. Some items may interact with your medicine. What should I watch for while using this medicine? Visit your doctor for checks on your progress. This drug may make you feel generally unwell. This is not uncommon, as chemotherapy can affect healthy cells as well as cancer cells. Report any side effects. Continue your course of treatment even though you feel ill unless your doctor tells you to stop. In some cases, you may be given additional medicines to help with side effects. Follow all directions for their use. Call your doctor or health care professional for advice if you get a fever, chills or sore throat, or other symptoms of a cold or flu. Do not treat yourself. This drug decreases your body's ability to fight infections. Try to avoid being around people who are sick. This medicine may increase your risk to bruise or bleed. Call your doctor or health care professional if you notice any unusual bleeding. Be careful brushing and flossing your teeth or using a toothpick because you may get an infection or bleed more easily. If you have any dental work done, tell your dentist you are receiving this medicine. Avoid taking products that contain aspirin, acetaminophen, ibuprofen, naproxen, or ketoprofen unless instructed by your doctor. These medicines may hide a fever. Do not become pregnant while taking this medicine or for 6 months after stopping it. Women should inform their doctor if they wish to become pregnant or think they might be pregnant. Men should not father a child while taking this medicine and for 3 months after stopping it. There is a potential for serious side effects to an unborn child. Talk to your health care   professional or pharmacist for more information. Do not breast-feed an infant while taking this medicine or for at least 1 week after stopping it. Men should inform their doctors if they wish to father a child.  This medicine may lower sperm counts. Talk with your doctor or health care professional if you are concerned about your fertility. What side effects may I notice from receiving this medicine? Side effects that you should report to your doctor or health care professional as soon as possible: -allergic reactions like skin rash, itching or hives, swelling of the face, lips, or tongue -breathing problems -pain, redness, or irritation at site where injected -signs and symptoms of a dangerous change in heartbeat or heart rhythm like chest pain; dizziness; fast or irregular heartbeat; palpitations; feeling faint or lightheaded, falls; breathing problems -signs of decreased platelets or bleeding - bruising, pinpoint red spots on the skin, black, tarry stools, blood in the urine -signs of decreased red blood cells - unusually weak or tired, feeling faint or lightheaded, falls -signs of infection - fever or chills, cough, sore throat, pain or difficulty passing urine -signs and symptoms of kidney injury like trouble passing urine or change in the amount of urine -signs and symptoms of liver injury like dark yellow or brown urine; general ill feeling or flu-like symptoms; light-colored stools; loss of appetite; nausea; right upper belly pain; unusually weak or tired; yellowing of the eyes or skin -swelling of ankles, feet, hands Side effects that usually do not require medical attention (report to your doctor or health care professional if they continue or are bothersome): -constipation -diarrhea -hair loss -loss of appetite -nausea -rash -vomiting This list may not describe all possible side effects. Call your doctor for medical advice about side effects. You may report side effects to FDA at 1-800-FDA-1088. Where should I keep my medicine? This drug is given in a hospital or clinic and will not be stored at home. NOTE: This sheet is a summary. It may not cover all possible information. If you have  questions about this medicine, talk to your doctor, pharmacist, or health care provider.  2019 Elsevier/Gold Standard (2018-03-06 18:06:11)     Cisplatin injection What is this medicine? CISPLATIN (SIS pla tin) is a chemotherapy drug. It targets fast dividing cells, like cancer cells, and causes these cells to die. This medicine is used to treat many types of cancer like bladder, ovarian, and testicular cancers. This medicine may be used for other purposes; ask your health care provider or pharmacist if you have questions. COMMON BRAND NAME(S): Platinol, Platinol -AQ What should I tell my health care provider before I take this medicine? They need to know if you have any of these conditions: -blood disorders -hearing problems -kidney disease -recent or ongoing radiation therapy -an unusual or allergic reaction to cisplatin, carboplatin, other chemotherapy, other medicines, foods, dyes, or preservatives -pregnant or trying to get pregnant -breast-feeding How should I use this medicine? This drug is given as an infusion into a vein. It is administered in a hospital or clinic by a specially trained health care professional. Talk to your pediatrician regarding the use of this medicine in children. Special care may be needed. Overdosage: If you think you have taken too much of this medicine contact a poison control center or emergency room at once. NOTE: This medicine is only for you. Do not share this medicine with others. What if I miss a dose? It is important not to miss a dose. Call your doctor   or health care professional if you are unable to keep an appointment. What may interact with this medicine? -dofetilide -foscarnet -medicines for seizures -medicines to increase blood counts like filgrastim, pegfilgrastim, sargramostim -probenecid -pyridoxine used with altretamine -rituximab -some antibiotics like amikacin, gentamicin, neomycin, polymyxin B, streptomycin,  tobramycin -sulfinpyrazone -vaccines -zalcitabine Talk to your doctor or health care professional before taking any of these medicines: -acetaminophen -aspirin -ibuprofen -ketoprofen -naproxen This list may not describe all possible interactions. Give your health care provider a list of all the medicines, herbs, non-prescription drugs, or dietary supplements you use. Also tell them if you smoke, drink alcohol, or use illegal drugs. Some items may interact with your medicine. What should I watch for while using this medicine? Your condition will be monitored carefully while you are receiving this medicine. You will need important blood work done while you are taking this medicine. This drug may make you feel generally unwell. This is not uncommon, as chemotherapy can affect healthy cells as well as cancer cells. Report any side effects. Continue your course of treatment even though you feel ill unless your doctor tells you to stop. In some cases, you may be given additional medicines to help with side effects. Follow all directions for their use. Call your doctor or health care professional for advice if you get a fever, chills or sore throat, or other symptoms of a cold or flu. Do not treat yourself. This drug decreases your body's ability to fight infections. Try to avoid being around people who are sick. This medicine may increase your risk to bruise or bleed. Call your doctor or health care professional if you notice any unusual bleeding. Be careful brushing and flossing your teeth or using a toothpick because you may get an infection or bleed more easily. If you have any dental work done, tell your dentist you are receiving this medicine. Avoid taking products that contain aspirin, acetaminophen, ibuprofen, naproxen, or ketoprofen unless instructed by your doctor. These medicines may hide a fever. Do not become pregnant while taking this medicine. Women should inform their doctor if they wish  to become pregnant or think they might be pregnant. There is a potential for serious side effects to an unborn child. Talk to your health care professional or pharmacist for more information. Do not breast-feed an infant while taking this medicine. Drink fluids as directed while you are taking this medicine. This will help protect your kidneys. Call your doctor or health care professional if you get diarrhea. Do not treat yourself. What side effects may I notice from receiving this medicine? Side effects that you should report to your doctor or health care professional as soon as possible: -allergic reactions like skin rash, itching or hives, swelling of the face, lips, or tongue -signs of infection - fever or chills, cough, sore throat, pain or difficulty passing urine -signs of decreased platelets or bleeding - bruising, pinpoint red spots on the skin, black, tarry stools, nosebleeds -signs of decreased red blood cells - unusually weak or tired, fainting spells, lightheadedness -breathing problems -changes in hearing -gout pain -low blood counts - This drug may decrease the number of white blood cells, red blood cells and platelets. You may be at increased risk for infections and bleeding. -nausea and vomiting -pain, swelling, redness or irritation at the injection site -pain, tingling, numbness in the hands or feet -problems with balance, movement -trouble passing urine or change in the amount of urine Side effects that usually do not require medical   attention (report to your doctor or health care professional if they continue or are bothersome): -changes in vision -loss of appetite -metallic taste in the mouth or changes in taste This list may not describe all possible side effects. Call your doctor for medical advice about side effects. You may report side effects to FDA at 1-800-FDA-1088. Where should I keep my medicine? This drug is given in a hospital or clinic and will not be stored  at home. NOTE: This sheet is a summary. It may not cover all possible information. If you have questions about this medicine, talk to your doctor, pharmacist, or health care provider.  2019 Elsevier/Gold Standard (2008-03-17 14:40:54)  

## 2019-03-14 ENCOUNTER — Telehealth: Payer: Self-pay | Admitting: Oncology

## 2019-03-14 NOTE — Telephone Encounter (Signed)
No 3/17 los.

## 2019-03-23 ENCOUNTER — Other Ambulatory Visit: Payer: Self-pay | Admitting: Oncology

## 2019-03-25 ENCOUNTER — Inpatient Hospital Stay: Payer: PPO | Admitting: Nurse Practitioner

## 2019-03-25 ENCOUNTER — Telehealth: Payer: Self-pay | Admitting: Oncology

## 2019-03-25 ENCOUNTER — Inpatient Hospital Stay: Payer: PPO

## 2019-03-25 NOTE — Telephone Encounter (Signed)
R/s appt per 3/31 sch message - pt aware of appt changes

## 2019-03-26 ENCOUNTER — Other Ambulatory Visit: Payer: Self-pay

## 2019-03-26 ENCOUNTER — Inpatient Hospital Stay: Payer: PPO | Attending: Oncology

## 2019-03-26 ENCOUNTER — Inpatient Hospital Stay: Payer: PPO

## 2019-03-26 ENCOUNTER — Encounter: Payer: Self-pay | Admitting: General Practice

## 2019-03-26 ENCOUNTER — Inpatient Hospital Stay (HOSPITAL_BASED_OUTPATIENT_CLINIC_OR_DEPARTMENT_OTHER): Payer: PPO | Admitting: Oncology

## 2019-03-26 ENCOUNTER — Inpatient Hospital Stay: Payer: PPO | Admitting: Nutrition

## 2019-03-26 VITALS — BP 144/60 | HR 81 | Temp 98.1°F | Resp 24 | Wt 132.4 lb

## 2019-03-26 DIAGNOSIS — M549 Dorsalgia, unspecified: Secondary | ICD-10-CM | POA: Insufficient documentation

## 2019-03-26 DIAGNOSIS — D509 Iron deficiency anemia, unspecified: Secondary | ICD-10-CM

## 2019-03-26 DIAGNOSIS — C221 Intrahepatic bile duct carcinoma: Secondary | ICD-10-CM | POA: Diagnosis not present

## 2019-03-26 DIAGNOSIS — R110 Nausea: Secondary | ICD-10-CM | POA: Insufficient documentation

## 2019-03-26 DIAGNOSIS — T451X5S Adverse effect of antineoplastic and immunosuppressive drugs, sequela: Secondary | ICD-10-CM | POA: Insufficient documentation

## 2019-03-26 DIAGNOSIS — D701 Agranulocytosis secondary to cancer chemotherapy: Secondary | ICD-10-CM | POA: Insufficient documentation

## 2019-03-26 DIAGNOSIS — Z79899 Other long term (current) drug therapy: Secondary | ICD-10-CM

## 2019-03-26 DIAGNOSIS — D6959 Other secondary thrombocytopenia: Secondary | ICD-10-CM | POA: Diagnosis not present

## 2019-03-26 DIAGNOSIS — Z95828 Presence of other vascular implants and grafts: Secondary | ICD-10-CM

## 2019-03-26 DIAGNOSIS — Z452 Encounter for adjustment and management of vascular access device: Secondary | ICD-10-CM | POA: Diagnosis not present

## 2019-03-26 DIAGNOSIS — Z5111 Encounter for antineoplastic chemotherapy: Secondary | ICD-10-CM | POA: Diagnosis not present

## 2019-03-26 DIAGNOSIS — R5381 Other malaise: Secondary | ICD-10-CM

## 2019-03-26 LAB — CBC WITH DIFFERENTIAL (CANCER CENTER ONLY)
Abs Immature Granulocytes: 0 10*3/uL (ref 0.00–0.07)
Basophils Absolute: 0 10*3/uL (ref 0.0–0.1)
Basophils Relative: 1 %
Eosinophils Absolute: 0 10*3/uL (ref 0.0–0.5)
Eosinophils Relative: 1 %
HCT: 32.8 % — ABNORMAL LOW (ref 36.0–46.0)
Hemoglobin: 10.4 g/dL — ABNORMAL LOW (ref 12.0–15.0)
Immature Granulocytes: 0 %
Lymphocytes Relative: 27 %
Lymphs Abs: 0.5 10*3/uL — ABNORMAL LOW (ref 0.7–4.0)
MCH: 26.8 pg (ref 26.0–34.0)
MCHC: 31.7 g/dL (ref 30.0–36.0)
MCV: 84.5 fL (ref 80.0–100.0)
Monocytes Absolute: 0.3 10*3/uL (ref 0.1–1.0)
Monocytes Relative: 16 %
Neutro Abs: 0.9 10*3/uL — ABNORMAL LOW (ref 1.7–7.7)
Neutrophils Relative %: 55 %
Platelet Count: 170 10*3/uL (ref 150–400)
RBC: 3.88 MIL/uL (ref 3.87–5.11)
RDW: 19.8 % — ABNORMAL HIGH (ref 11.5–15.5)
WBC Count: 1.7 10*3/uL — ABNORMAL LOW (ref 4.0–10.5)
nRBC: 0 % (ref 0.0–0.2)

## 2019-03-26 LAB — CMP (CANCER CENTER ONLY)
ALT: 18 U/L (ref 0–44)
AST: 23 U/L (ref 15–41)
Albumin: 3.9 g/dL (ref 3.5–5.0)
Alkaline Phosphatase: 152 U/L — ABNORMAL HIGH (ref 38–126)
Anion gap: 10 (ref 5–15)
BUN: 15 mg/dL (ref 8–23)
CO2: 27 mmol/L (ref 22–32)
Calcium: 9.9 mg/dL (ref 8.9–10.3)
Chloride: 104 mmol/L (ref 98–111)
Creatinine: 0.67 mg/dL (ref 0.44–1.00)
GFR, Est AFR Am: >60 mL/min (ref >60)
GFR, Estimated: >60 mL/min (ref >60)
Glucose, Bld: 103 mg/dL — ABNORMAL HIGH (ref 70–99)
Potassium: 3.7 mmol/L (ref 3.5–5.1)
Sodium: 141 mmol/L (ref 135–145)
Total Bilirubin: 0.3 mg/dL (ref 0.3–1.2)
Total Protein: 6.9 g/dL (ref 6.5–8.1)

## 2019-03-26 LAB — MAGNESIUM: Magnesium: 2.1 mg/dL (ref 1.7–2.4)

## 2019-03-26 MED ORDER — SODIUM CHLORIDE 0.9% FLUSH
10.0000 mL | Freq: Once | INTRAVENOUS | Status: AC
  Administered 2019-03-26: 10 mL
  Filled 2019-03-26: qty 10

## 2019-03-26 MED ORDER — SODIUM CHLORIDE 0.9% FLUSH
10.0000 mL | INTRAVENOUS | Status: DC | PRN
  Administered 2019-03-26: 11:00:00 10 mL via INTRAVENOUS
  Filled 2019-03-26: qty 10

## 2019-03-26 MED ORDER — HEPARIN SOD (PORK) LOCK FLUSH 100 UNIT/ML IV SOLN
500.0000 [IU] | Freq: Once | INTRAVENOUS | Status: AC
  Administered 2019-03-26: 500 [IU] via INTRAVENOUS
  Filled 2019-03-26: qty 5

## 2019-03-26 NOTE — Progress Notes (Signed)
[facility] OFFICE PROGRESS NOTE   Diagnosis: Adenocarcinoma  INTERVAL HISTORY:   [name] returns for scheduled visit.  She completed cycle 1 gemcitabine/cisplatin on 03/12/2019.  No rash, fever, or acute nausea.  She takes Compazine in the morning and evening for relief of chronic nausea.  This helps.  Good appetite.  She had malaise on day 3 following chemotherapy.  She has "spine "pain diffusely in the mornings.  She takes Tylenol.  This improves during the day.  Objective:  Vital signs in last 24 hours:  Blood pressure (!) 144/60, pulse 81, temperature 98.1 F (36.7 C), temperature source Oral, resp. rate (!) 24, weight 132 lb 6.4 oz (60.1 kg), SpO2 100 %.    HEENT: No thrush GI: No hepatomegaly, mild tenderness in the right upper abdomen Vascular: No leg edema Skin: No rash  Portacath/PICC-without erythema  Lab Results:  Lab Results  Component Value Date   WBC 1.7 (L) 03/26/2019   HGB 10.4 (L) 03/26/2019   HCT 32.8 (L) 03/26/2019   MCV 84.5 03/26/2019   PLT 170 03/26/2019   NEUTROABS 0.9 (L) 03/26/2019    CMP  Lab Results  Component Value Date   NA 141 03/26/2019   K 3.7 03/26/2019   CL 104 03/26/2019   CO2 27 03/26/2019   GLUCOSE 103 (H) 03/26/2019   BUN 15 03/26/2019   CREATININE 0.67 03/26/2019   CALCIUM 9.9 03/26/2019   PROT 6.9 03/26/2019   ALBUMIN 3.9 03/26/2019   AST 23 03/26/2019   ALT 18 03/26/2019   ALKPHOS 152 (H) 03/26/2019   BILITOT 0.3 03/26/2019   GFRNONAA >60 03/26/2019   GFRAA >60 03/26/2019    Lab Results  Component Value Date   CEA1 <1.00 02/21/2019     Medications: I have reviewed the patient's current medications.   Assessment/Plan: 1. Right liver mass-biopsy 07/10/2016 confirmed adenocarcinoma, PDL 1 0%, MSS, tumor mutation burden 3, IDH1 and PIK3 CA alterations  CT abdomen/pelvis 06/17/2016 and MRI abdomen 06/21/2016 confirmed an isolated right liver mass and a cystic pancreas lesion  PET scan  07/28/2016-the hepatic lesion is not hypermetabolic, hypermetabolic lesion in the gastric antrum suspicious for a primary neoplasm and no other evidence of metastatic disease  Upper endoscopy 08/03/2016-no mass found, biopsy from the gastric antrum-benign  CT-guided ablation of the right liver lesion 11/03/2016  MRI abdomen 03/08/2017-ablation defect in the right liver without residual/recurrent disease  MRI liver 06/11/2017-multiple new enhancing lesions in the right hepatic lobe surrounding the ablation defect consistent with progressive cholangiocarcinoma  Y-90 radioembolization of the right liver 07/19/2017  MRI abdomen 10/09/2017-suspected progression of intrahepatic larger carcinoma within segment 6, reviewed by interventional radiology most consistent with pseudo-progression  MRI abdomen 01/08/2018-slight decrease in the segment 6 mass, no change in enhancement at the ablation bed, new tiny arterial phase area of hyperenhancement in the lateral left liver-potentially a vascular phenomena  MRI abdomen 04/03/2018- stable segment 6 mass, no change in 10 mm flash filling segment 3 lesion-potentially a vascular shunt, a porta hepatis node measures 2.2 cm compared to 1.5 cm, no new lymphadenopathy  MRI abdomen 07/03/2018 stable segment 6 liver mass, stable segment 3 arterial phase enhancing lesion stable porta hepatis adenopathy, stable pancreas cystic lesion  MRI abdomen 11/20/2018- new 2.1 cm lateral left liver lesion, new 8 mm medial left liver lesion, no change in posterior right hepatic mass, no change in necrotic hepato-duodenal ligament lymph node no change in cystic pancreas lesion  Y 90 of the left liver  01/16/2019  Cycle 1 gemcitabine/cisplatin 03/12/2019 2. Left leg and right foot pain  Negative left leg Doppler 07/14/2016   3. Right abdominal pain- potentially related to the dominant right liver mass  4. Anorexia/weight loss  5. Benign appearing 16 mm cystic  uncinatepancreas lesion noted on MRI 06/20/2016   Slightly larger on an MRI 03/08/2017, felt to most likely represent a side branch intraductal papillary mucinous neoplasm  Stable on MRI 10/09/2017  Stable on MRI 01/08/2018  Stable on MRI 04/03/2018  Stable on MRI 07/03/2018  Stable on MRI 11/20/2018  6.  Anemia-iron deficiency  7.  Port-A-Cath placement interventional radiology 03/04/2019  8.  Neutropenia secondary to chemotherapy- gemcitabine and cisplatin dose reduced beginning with cycle 2    Disposition: Laura [name] appears stable.  She tolerated the first cycle of gemcitabine/cisplatin well.  She has neutropenia today.  She will call for symptoms of an infection.  Cycle 2 chemotherapy will be delayed for 1 week.  The chemotherapy will be dose reduced.  She will continue iron.  [name] will be scheduled for office visit and chemotherapy on 04/16/2019.  [name], MD  03/26/2019  10:51 AM

## 2019-03-26 NOTE — Progress Notes (Signed)
[place] Team contacted patient to assess for food insecurity and other psychosocial needs during current COVID19 pandemic.  Stays home most of the time, has neighbors who help her out.  "I am well taken care of."  Patient/family expressed no needs at this time.  Support Team member encouraged patient to call if changes occur or they have any other questions/concerns.   Beverely Pace, [place]

## 2019-03-27 ENCOUNTER — Telehealth: Payer: Self-pay | Admitting: Oncology

## 2019-03-27 NOTE — Telephone Encounter (Signed)
Scheduled appt per 4/1 los.  Added treatment in the book for approval (4/8)

## 2019-03-31 ENCOUNTER — Telehealth: Payer: Self-pay | Admitting: Oncology

## 2019-03-31 NOTE — Telephone Encounter (Signed)
Called patient and informed patient that her treatment has been added for 04/02/2019.  Patient aware of appt date and time.

## 2019-04-02 ENCOUNTER — Other Ambulatory Visit: Payer: Self-pay | Admitting: *Deleted

## 2019-04-02 ENCOUNTER — Inpatient Hospital Stay: Payer: PPO

## 2019-04-02 ENCOUNTER — Other Ambulatory Visit: Payer: Self-pay

## 2019-04-02 VITALS — BP 145/71 | HR 70 | Temp 97.9°F | Resp 20 | Wt 134.0 lb

## 2019-04-02 DIAGNOSIS — C221 Intrahepatic bile duct carcinoma: Secondary | ICD-10-CM

## 2019-04-02 DIAGNOSIS — Z5111 Encounter for antineoplastic chemotherapy: Secondary | ICD-10-CM | POA: Diagnosis not present

## 2019-04-02 DIAGNOSIS — Z95828 Presence of other vascular implants and grafts: Secondary | ICD-10-CM

## 2019-04-02 LAB — CMP (CANCER CENTER ONLY)
ALT: 16 U/L (ref 0–44)
AST: 23 U/L (ref 15–41)
Albumin: 3.8 g/dL (ref 3.5–5.0)
Alkaline Phosphatase: 140 U/L — ABNORMAL HIGH (ref 38–126)
Anion gap: 10 (ref 5–15)
BUN: 9 mg/dL (ref 8–23)
CO2: 25 mmol/L (ref 22–32)
Calcium: 9.6 mg/dL (ref 8.9–10.3)
Chloride: 104 mmol/L (ref 98–111)
Creatinine: 0.68 mg/dL (ref 0.44–1.00)
GFR, Est AFR Am: >60 mL/min (ref >60)
GFR, Estimated: >60 mL/min (ref >60)
Glucose, Bld: 134 mg/dL — ABNORMAL HIGH (ref 70–99)
Potassium: 3.7 mmol/L (ref 3.5–5.1)
Sodium: 139 mmol/L (ref 135–145)
Total Bilirubin: 0.3 mg/dL (ref 0.3–1.2)
Total Protein: 6.9 g/dL (ref 6.5–8.1)

## 2019-04-02 LAB — CBC WITH DIFFERENTIAL (CANCER CENTER ONLY)
Abs Immature Granulocytes: 0 10*3/uL (ref 0.00–0.07)
Basophils Absolute: 0 10*3/uL (ref 0.0–0.1)
Basophils Relative: 2 %
Eosinophils Absolute: 0 10*3/uL (ref 0.0–0.5)
Eosinophils Relative: 1 %
HCT: 34.7 % — ABNORMAL LOW (ref 36.0–46.0)
Hemoglobin: 10.9 g/dL — ABNORMAL LOW (ref 12.0–15.0)
Immature Granulocytes: 0 %
Lymphocytes Relative: 28 %
Lymphs Abs: 0.5 10*3/uL — ABNORMAL LOW (ref 0.7–4.0)
MCH: 27 pg (ref 26.0–34.0)
MCHC: 31.4 g/dL (ref 30.0–36.0)
MCV: 86.1 fL (ref 80.0–100.0)
Monocytes Absolute: 0.4 10*3/uL (ref 0.1–1.0)
Monocytes Relative: 21 %
Neutro Abs: 0.9 10*3/uL — ABNORMAL LOW (ref 1.7–7.7)
Neutrophils Relative %: 48 %
Platelet Count: 336 10*3/uL (ref 150–400)
RBC: 4.03 MIL/uL (ref 3.87–5.11)
RDW: 20.9 % — ABNORMAL HIGH (ref 11.5–15.5)
WBC Count: 1.8 10*3/uL — ABNORMAL LOW (ref 4.0–10.5)
nRBC: 0 % (ref 0.0–0.2)

## 2019-04-02 MED ORDER — HEPARIN SOD (PORK) LOCK FLUSH 100 UNIT/ML IV SOLN
500.0000 [IU] | Freq: Once | INTRAVENOUS | Status: AC
  Administered 2019-04-02: 500 [IU]
  Filled 2019-04-02: qty 5

## 2019-04-02 MED ORDER — SODIUM CHLORIDE 0.9% FLUSH
10.0000 mL | Freq: Once | INTRAVENOUS | Status: AC
  Administered 2019-04-02: 10 mL
  Filled 2019-04-02: qty 10

## 2019-04-02 NOTE — Progress Notes (Signed)
Counts too low to treat today. Orders sent for chemo 04/09/19 with lab/flush prior.

## 2019-04-02 NOTE — Progress Notes (Signed)
Patient's Soap Laura [name]. Pt stated she though her treatment was being postponed for a week. I spoke with Dr. Benay Spice. Pt's treatment today postponed to next week. I spoke to Coletta Memos, RN to make those arrangements.

## 2019-04-03 ENCOUNTER — Telehealth: Payer: Self-pay | Admitting: Oncology

## 2019-04-03 NOTE — Telephone Encounter (Signed)
Spoke with patient re 4/15 appointments.

## 2019-04-04 ENCOUNTER — Telehealth: Payer: Self-pay | Admitting: Oncology

## 2019-04-04 NOTE — Telephone Encounter (Signed)
Scheduled per sch msg. Called and spoke with patient. Confirmed dates and times  °

## 2019-04-09 ENCOUNTER — Other Ambulatory Visit: Payer: Self-pay

## 2019-04-09 ENCOUNTER — Other Ambulatory Visit: Payer: Self-pay | Admitting: *Deleted

## 2019-04-09 ENCOUNTER — Inpatient Hospital Stay: Payer: PPO

## 2019-04-09 ENCOUNTER — Other Ambulatory Visit: Payer: Self-pay | Admitting: Oncology

## 2019-04-09 VITALS — BP 134/59 | HR 67 | Temp 98.1°F | Resp 18

## 2019-04-09 DIAGNOSIS — Z95828 Presence of other vascular implants and grafts: Secondary | ICD-10-CM

## 2019-04-09 DIAGNOSIS — Z5111 Encounter for antineoplastic chemotherapy: Secondary | ICD-10-CM | POA: Diagnosis not present

## 2019-04-09 DIAGNOSIS — C221 Intrahepatic bile duct carcinoma: Secondary | ICD-10-CM

## 2019-04-09 LAB — CBC WITH DIFFERENTIAL (CANCER CENTER ONLY)
Abs Immature Granulocytes: 0.01 10*3/uL (ref 0.00–0.07)
Basophils Absolute: 0 10*3/uL (ref 0.0–0.1)
Basophils Relative: 2 %
Eosinophils Absolute: 0 10*3/uL (ref 0.0–0.5)
Eosinophils Relative: 1 %
HCT: 36 % (ref 36.0–46.0)
Hemoglobin: 11.6 g/dL — ABNORMAL LOW (ref 12.0–15.0)
Immature Granulocytes: 0 %
Lymphocytes Relative: 22 %
Lymphs Abs: 0.6 10*3/uL — ABNORMAL LOW (ref 0.7–4.0)
MCH: 28 pg (ref 26.0–34.0)
MCHC: 32.2 g/dL (ref 30.0–36.0)
MCV: 86.7 fL (ref 80.0–100.0)
Monocytes Absolute: 0.4 10*3/uL (ref 0.1–1.0)
Monocytes Relative: 17 %
Neutro Abs: 1.6 10*3/uL — ABNORMAL LOW (ref 1.7–7.7)
Neutrophils Relative %: 58 %
Platelet Count: 234 10*3/uL (ref 150–400)
RBC: 4.15 MIL/uL (ref 3.87–5.11)
RDW: 20.1 % — ABNORMAL HIGH (ref 11.5–15.5)
WBC Count: 2.6 10*3/uL — ABNORMAL LOW (ref 4.0–10.5)
nRBC: 0 % (ref 0.0–0.2)

## 2019-04-09 LAB — CMP (CANCER CENTER ONLY)
ALT: 14 U/L (ref 0–44)
AST: 23 U/L (ref 15–41)
Albumin: 3.8 g/dL (ref 3.5–5.0)
Alkaline Phosphatase: 142 U/L — ABNORMAL HIGH (ref 38–126)
Anion gap: 12 (ref 5–15)
BUN: 11 mg/dL (ref 8–23)
CO2: 25 mmol/L (ref 22–32)
Calcium: 9.7 mg/dL (ref 8.9–10.3)
Chloride: 104 mmol/L (ref 98–111)
Creatinine: 0.64 mg/dL (ref 0.44–1.00)
GFR, Est AFR Am: >60 mL/min (ref >60)
GFR, Estimated: >60 mL/min (ref >60)
Glucose, Bld: 98 mg/dL (ref 70–99)
Potassium: 3.8 mmol/L (ref 3.5–5.1)
Sodium: 141 mmol/L (ref 135–145)
Total Bilirubin: 0.3 mg/dL (ref 0.3–1.2)
Total Protein: 7 g/dL (ref 6.5–8.1)

## 2019-04-09 LAB — MAGNESIUM: Magnesium: 2.2 mg/dL (ref 1.7–2.4)

## 2019-04-09 MED ORDER — SODIUM CHLORIDE 0.9 % IV SOLN
20.0000 mg/m2 | Freq: Once | INTRAVENOUS | Status: AC
  Administered 2019-04-09: 33 mg via INTRAVENOUS
  Filled 2019-04-09: qty 33

## 2019-04-09 MED ORDER — HEPARIN SOD (PORK) LOCK FLUSH 100 UNIT/ML IV SOLN
500.0000 [IU] | Freq: Once | INTRAVENOUS | Status: AC | PRN
  Administered 2019-04-09: 16:00:00 500 [IU]
  Filled 2019-04-09: qty 5

## 2019-04-09 MED ORDER — SODIUM CHLORIDE 0.9 % IV SOLN
Freq: Once | INTRAVENOUS | Status: AC
  Administered 2019-04-09: 12:00:00 via INTRAVENOUS
  Filled 2019-04-09: qty 5

## 2019-04-09 MED ORDER — SODIUM CHLORIDE 0.9 % IV SOLN
640.0000 mg/m2 | Freq: Once | INTRAVENOUS | Status: AC
  Administered 2019-04-09: 13:00:00 1064 mg via INTRAVENOUS
  Filled 2019-04-09: qty 27.98

## 2019-04-09 MED ORDER — PALONOSETRON HCL INJECTION 0.25 MG/5ML
INTRAVENOUS | Status: AC
  Filled 2019-04-09: qty 5

## 2019-04-09 MED ORDER — PALONOSETRON HCL INJECTION 0.25 MG/5ML
0.2500 mg | Freq: Once | INTRAVENOUS | Status: AC
  Administered 2019-04-09: 0.25 mg via INTRAVENOUS

## 2019-04-09 MED ORDER — SODIUM CHLORIDE 0.9% FLUSH
10.0000 mL | INTRAVENOUS | Status: DC | PRN
  Administered 2019-04-09: 10 mL
  Filled 2019-04-09: qty 10

## 2019-04-09 MED ORDER — POTASSIUM CHLORIDE 2 MEQ/ML IV SOLN
Freq: Once | INTRAVENOUS | Status: AC
  Administered 2019-04-09: 10:00:00 via INTRAVENOUS
  Filled 2019-04-09: qty 10

## 2019-04-09 MED ORDER — SODIUM CHLORIDE 0.9 % IV SOLN
Freq: Once | INTRAVENOUS | Status: AC
  Administered 2019-04-09: 10:00:00 via INTRAVENOUS
  Filled 2019-04-09: qty 250

## 2019-04-09 MED ORDER — PROCHLORPERAZINE MALEATE 5 MG PO TABS: 5.0000 mg | ORAL_TABLET | Freq: Four times a day (QID) | ORAL | 1 refills | Status: DC | PRN

## 2019-04-09 MED ORDER — SODIUM CHLORIDE 0.9% FLUSH
10.0000 mL | Freq: Once | INTRAVENOUS | Status: AC
  Administered 2019-04-09: 09:00:00 10 mL
  Filled 2019-04-09: qty 10

## 2019-04-09 NOTE — Progress Notes (Signed)
Refill requested by infusion nurse for patient for compazine. Escribed refill as requested.

## 2019-04-09 NOTE — Patient Instructions (Signed)
Coronavirus (COVID-19) Are you at risk?  Are you at risk for the Coronavirus (COVID-19)?  To be considered HIGH RISK for Coronavirus (COVID-19), you have to meet the following criteria:  . Traveled to [place], [place], [place], [place] or [place]; or in the [place] to [place], [place], [place], or [place]; and have fever, cough, and shortness of breath within the last 2 weeks of travel OR . Been in close contact with a person diagnosed with COVID-19 within the last 2 weeks and have fever, cough, and shortness of breath . IF YOU DO NOT MEET THESE CRITERIA, YOU ARE CONSIDERED LOW RISK FOR COVID-19.  What to do if you are HIGH RISK for COVID-19?  . If you are having a medical emergency, call 911. . Seek medical care right away. Before you go to a doctor's office, urgent care or emergency department, call ahead and tell them about your recent travel, contact with someone diagnosed with COVID-19, and your symptoms. You should receive instructions from your physician's office regarding next steps of care.  . When you arrive at healthcare provider, tell the healthcare staff immediately you have returned from visiting [place], [place], [place], [place] or [place]; or traveled in the [place] to [place], [place], [place], or [place]; in the last two weeks or you have been in close contact with a person diagnosed with COVID-19 in the last 2 weeks.   . Tell the health care staff about your symptoms: fever, cough and shortness of breath. . After you have been seen by a medical provider, you will be either: o Tested for (COVID-19) and discharged home on quarantine except to seek medical care if symptoms worsen, and asked to  - Stay home and avoid contact with others until you get your results (4-5 days)  - Avoid travel on public transportation if possible (such as bus, train, or airplane) or o Sent to the Emergency Department by EMS for evaluation, COVID-19 testing, and possible  admission depending on your condition and test results.  What to do if you are LOW RISK for COVID-19?  Reduce your risk of any infection by using the same precautions used for avoiding the common cold or flu:  . Wash your hands often with soap and warm water for at least 20 seconds.  If soap and water are not readily available, use an alcohol-based hand sanitizer with at least 60% alcohol.  . If coughing or sneezing, cover your mouth and nose by coughing or sneezing into the elbow areas of your shirt or coat, into a tissue or into your sleeve (not your hands). . Avoid shaking hands with others and consider head nods or verbal greetings only. . Avoid touching your eyes, nose, or mouth with unwashed hands.  . Avoid close contact with people who are sick. . Avoid places or events with large numbers of people in one location, like concerts or sporting events. . Carefully consider travel plans you have or are making. . If you are planning any travel outside or inside the [place], visit the CDC's Travelers' Health webpage for the latest health notices. . If you have some symptoms but not all symptoms, continue to monitor at home and seek medical attention if your symptoms worsen. . If you are having a medical emergency, call 911.   ADDITIONAL HEALTHCARE OPTIONS FOR PATIENTS  [facility] / e-Visit: [email or web address]         [facility]: [phone]  Onset   Urgent Care: [facility]: [place] Discharge Instructions for Patients Receiving Chemotherapy  Today you received the following chemotherapy agents: gemcitabine (Gemzar) and cisplatin (Platinol).  To help prevent nausea and vomiting after your treatment, we encourage you to take your nausea medication as prescribed by your physician.   If you develop nausea and vomiting that is not controlled  by your nausea medication, call the clinic.   BELOW ARE SYMPTOMS THAT SHOULD BE REPORTED IMMEDIATELY:  *FEVER GREATER THAN 100.5 F  *CHILLS WITH OR WITHOUT FEVER  NAUSEA AND VOMITING THAT IS NOT CONTROLLED WITH YOUR NAUSEA MEDICATION  *UNUSUAL SHORTNESS OF BREATH  *UNUSUAL BRUISING OR BLEEDING  TENDERNESS IN MOUTH AND THROAT WITH OR WITHOUT PRESENCE OF ULCERS  *URINARY PROBLEMS  *BOWEL PROBLEMS  UNUSUAL RASH Items with * indicate a potential emergency and should be followed up as soon as possible.  Feel free to call the clinic should you have any questions or concerns. The clinic phone number is [phone].  Please show the [facility] at check-in to the Emergency Department and triage nurse.

## 2019-04-16 ENCOUNTER — Ambulatory Visit: Payer: PPO

## 2019-04-16 ENCOUNTER — Other Ambulatory Visit: Payer: PPO

## 2019-04-16 ENCOUNTER — Ambulatory Visit: Payer: PPO | Admitting: Oncology

## 2019-04-21 ENCOUNTER — Telehealth: Payer: Self-pay | Admitting: Oncology

## 2019-04-21 NOTE — Telephone Encounter (Signed)
Spoke with patient and got her set up with transportation for her appointments this week. Will meet with her tomorrow and get a waiver signed.

## 2019-04-22 ENCOUNTER — Inpatient Hospital Stay: Payer: PPO

## 2019-04-22 ENCOUNTER — Other Ambulatory Visit: Payer: Self-pay

## 2019-04-22 ENCOUNTER — Encounter: Payer: Self-pay | Admitting: Nurse Practitioner

## 2019-04-22 ENCOUNTER — Inpatient Hospital Stay (HOSPITAL_BASED_OUTPATIENT_CLINIC_OR_DEPARTMENT_OTHER): Payer: PPO | Admitting: Nurse Practitioner

## 2019-04-22 ENCOUNTER — Telehealth: Payer: Self-pay | Admitting: Nurse Practitioner

## 2019-04-22 VITALS — BP 133/56 | HR 68 | Temp 97.5°F | Resp 18 | Ht 65.0 in | Wt 135.3 lb

## 2019-04-22 DIAGNOSIS — D509 Iron deficiency anemia, unspecified: Secondary | ICD-10-CM

## 2019-04-22 DIAGNOSIS — Z95828 Presence of other vascular implants and grafts: Secondary | ICD-10-CM

## 2019-04-22 DIAGNOSIS — C221 Intrahepatic bile duct carcinoma: Secondary | ICD-10-CM

## 2019-04-22 DIAGNOSIS — D6959 Other secondary thrombocytopenia: Secondary | ICD-10-CM | POA: Diagnosis not present

## 2019-04-22 DIAGNOSIS — T451X5S Adverse effect of antineoplastic and immunosuppressive drugs, sequela: Secondary | ICD-10-CM | POA: Diagnosis not present

## 2019-04-22 DIAGNOSIS — D701 Agranulocytosis secondary to cancer chemotherapy: Secondary | ICD-10-CM

## 2019-04-22 DIAGNOSIS — Z79899 Other long term (current) drug therapy: Secondary | ICD-10-CM

## 2019-04-22 DIAGNOSIS — Z5111 Encounter for antineoplastic chemotherapy: Secondary | ICD-10-CM | POA: Diagnosis not present

## 2019-04-22 LAB — CBC WITH DIFFERENTIAL (CANCER CENTER ONLY)
Abs Immature Granulocytes: 0 10*3/uL (ref 0.00–0.07)
Basophils Absolute: 0 10*3/uL (ref 0.0–0.1)
Basophils Relative: 0 %
Eosinophils Absolute: 0 10*3/uL (ref 0.0–0.5)
Eosinophils Relative: 1 %
HCT: 34.2 % — ABNORMAL LOW (ref 36.0–46.0)
Hemoglobin: 11 g/dL — ABNORMAL LOW (ref 12.0–15.0)
Immature Granulocytes: 0 %
Lymphocytes Relative: 24 %
Lymphs Abs: 0.6 10*3/uL — ABNORMAL LOW (ref 0.7–4.0)
MCH: 28.5 pg (ref 26.0–34.0)
MCHC: 32.2 g/dL (ref 30.0–36.0)
MCV: 88.6 fL (ref 80.0–100.0)
Monocytes Absolute: 0.4 10*3/uL (ref 0.1–1.0)
Monocytes Relative: 16 %
Neutro Abs: 1.3 10*3/uL — ABNORMAL LOW (ref 1.7–7.7)
Neutrophils Relative %: 59 %
Platelet Count: 103 10*3/uL — ABNORMAL LOW (ref 150–400)
RBC: 3.86 MIL/uL — ABNORMAL LOW (ref 3.87–5.11)
RDW: 18.9 % — ABNORMAL HIGH (ref 11.5–15.5)
WBC Count: 2.3 10*3/uL — ABNORMAL LOW (ref 4.0–10.5)
nRBC: 0 % (ref 0.0–0.2)

## 2019-04-22 LAB — MAGNESIUM: Magnesium: 2 mg/dL (ref 1.7–2.4)

## 2019-04-22 MED ORDER — SODIUM CHLORIDE 0.9% FLUSH
10.0000 mL | Freq: Once | INTRAVENOUS | Status: AC
  Administered 2019-04-22: 09:00:00 10 mL
  Filled 2019-04-22: qty 10

## 2019-04-22 NOTE — Telephone Encounter (Signed)
Called and scheduled appt per 4/28 los.  Patient aware of appt date and time.

## 2019-04-22 NOTE — Patient Instructions (Signed)

## 2019-04-22 NOTE — Progress Notes (Addendum)
[place] OFFICE PROGRESS NOTE   Diagnosis: Adenocarcinoma  INTERVAL HISTORY:   [name] returns as scheduled.  She completed cycle 2 gemcitabine/cisplatin 04/09/2019.  She denies nausea/vomiting.  No mouth sores.  No diarrhea.  No fever or rash following treatment.  No numbness or tingling in the hands or feet.  No hearing loss or tinnitus.  She denies abdominal pain.  Appetite varies.  She denies bleeding.  Objective:  Vital signs in last 24 hours:  Blood pressure (!) 133/56, pulse 68, temperature (!) 97.5 F (36.4 C), temperature source Oral, resp. rate 18, height '5\' 5"'  (1.651 m), weight 135 lb 4.8 oz (61.4 kg), SpO2 98 %.    HEENT: No thrush or ulcers. GI: Abdomen is soft.  No hepatomegaly.  Mild tenderness at the right upper medial abdomen. Vascular: No leg edema. Port-A-Cath without erythema.  Lab Results:  Lab Results  Component Value Date   WBC 2.3 (L) 04/22/2019   HGB 11.0 (L) 04/22/2019   HCT 34.2 (L) 04/22/2019   MCV 88.6 04/22/2019   PLT 103 (L) 04/22/2019   NEUTROABS 1.3 (L) 04/22/2019    Imaging:  No results found.  Medications: I have reviewed the patient's current medications.  Assessment/Plan: 1. Right liver mass-biopsy 07/10/2016 confirmed adenocarcinoma, PDL 1 0%, MSS, tumor mutation burden 3, IDH1 and PIK3 CA alterations  CT abdomen/pelvis 06/17/2016 and MRI abdomen 06/21/2016 confirmed an isolated right liver mass and a cystic pancreas lesion  PET scan 07/28/2016-the hepatic lesion is not hypermetabolic, hypermetabolic lesion in the gastric antrum suspicious for a primary neoplasm and no other evidence of metastatic disease  Upper endoscopy 08/03/2016-no mass found, biopsy from the gastric antrum-benign  CT-guided ablation of the right liver lesion 11/03/2016  MRI abdomen 03/08/2017-ablation defect in the right liver without residual/recurrent disease  MRI liver 06/11/2017-multiple new enhancing lesions in the right  hepatic lobe surrounding the ablation defect consistent with progressive cholangiocarcinoma  Y-90 radioembolization of the right liver 07/19/2017  MRI abdomen 10/09/2017-suspected progression of intrahepatic larger carcinoma within segment 6, reviewed by interventional radiology most consistent with pseudo-progression  MRI abdomen 01/08/2018-slight decrease in the segment 6 mass, no change in enhancement at the ablation bed, new tiny arterial phase area of hyperenhancement in the lateral left liver-potentially a vascular phenomena  MRI abdomen 04/03/2018-stable segment 6 mass, no change in 10 mm flash filling segment 3 lesion-potentially a vascular shunt, a porta hepatis node measures 2.2 cm compared to 1.5 cm, no new lymphadenopathy  MRI abdomen 07/03/2018 stable segment 6 liver mass, stable segment 3 arterial phase enhancing lesion stable porta hepatis adenopathy, stable pancreas cystic lesion  MRI abdomen 11/20/2018- new 2.1 cm lateral left liver lesion, new 8 mm medial left liver lesion, no change in posterior right hepatic mass, no change in necrotic hepato-duodenal ligament lymph node no change in cystic pancreas lesion  Y 90 of the left liver 01/16/2019  Cycle 1 gemcitabine/cisplatin 03/12/2019  Cycle 2 gemcitabine/cisplatin 04/09/2019 (doses reduced due to neutropenia) 2. Left leg and right foot pain  Negative left leg Doppler 07/14/2016   3. Right abdominal pain- potentially related to the dominant right liver mass  4. Anorexia/weight loss  5. Benign appearing 16 mm cystic uncinatepancreas lesion noted on MRI 06/20/2016   Slightly larger on an MRI 03/08/2017, felt to most likely represent a side branch intraductal papillary mucinous neoplasm  Stable on MRI 10/09/2017  Stable on MRI 01/08/2018  Stable on MRI 04/03/2018  Stable on MRI 07/03/2018  Stable on MRI  11/20/2018  6. Anemia-iron deficiency  7.  Port-A-Cath placement interventional radiology  03/04/2019  8.  Neutropenia secondary to chemotherapy- gemcitabine and cisplatin dose reduced beginning with cycle 2; mild neutropenia and thrombocytopenia 04/22/2019.  Gemcitabine and cisplatin held.  Treatment changed to every 3 weeks beginning on 05/14/2019.    Disposition: [name] appears stable.  She has completed 2 cycles of gemcitabine/cisplatin.  Doses were reduced with cycle 2 due to neutropenia after cycle 1.  We reviewed the CBC from today.  She has mild neutropenia and thrombocytopenia.  We are holding chemotherapy today and rescheduling for 1 week.  Going forward chemotherapy will be given on an every 3-week schedule.  Gemcitabine will be further dose reduced with cycle 3.  She will return for cycle 3 gemcitabine/cisplatin in 1 week.  She will return for lab, follow-up and cycle 4 in 1 month.  She will contact the office in the interim with any problems.  We specifically discussed fever, chills, other signs of infection, bleeding.  Patient seen with Dr. Benay Spice.  25 minutes were spent face-to-face at today's visit with the majority of that time involved in counseling/coordination of care.    Ned Card ANP/GNP-BC   04/22/2019  10:29 AM  This was a shared visit with Ned Card.  Ms. Frappier has experienced significant hematologic toxicity from chemotherapy.  We decided to change to an every 3-week chemotherapy schedule.  The gemcitabine will be further dose reduced.  Julieanne Manson, MD

## 2019-04-23 ENCOUNTER — Ambulatory Visit: Payer: PPO

## 2019-04-23 ENCOUNTER — Inpatient Hospital Stay: Payer: PPO

## 2019-04-30 ENCOUNTER — Encounter: Payer: Self-pay | Admitting: *Deleted

## 2019-04-30 ENCOUNTER — Telehealth: Payer: Self-pay | Admitting: Oncology

## 2019-04-30 ENCOUNTER — Inpatient Hospital Stay: Payer: PPO | Attending: Oncology

## 2019-04-30 ENCOUNTER — Inpatient Hospital Stay: Payer: PPO

## 2019-04-30 ENCOUNTER — Other Ambulatory Visit: Payer: Self-pay

## 2019-04-30 VITALS — BP 138/55 | HR 68 | Temp 98.2°F | Resp 18

## 2019-04-30 DIAGNOSIS — E876 Hypokalemia: Secondary | ICD-10-CM | POA: Insufficient documentation

## 2019-04-30 DIAGNOSIS — Z5111 Encounter for antineoplastic chemotherapy: Secondary | ICD-10-CM | POA: Insufficient documentation

## 2019-04-30 DIAGNOSIS — Z7689 Persons encountering health services in other specified circumstances: Secondary | ICD-10-CM | POA: Insufficient documentation

## 2019-04-30 DIAGNOSIS — C9000 Multiple myeloma not having achieved remission: Secondary | ICD-10-CM

## 2019-04-30 DIAGNOSIS — Z95828 Presence of other vascular implants and grafts: Secondary | ICD-10-CM

## 2019-04-30 DIAGNOSIS — C221 Intrahepatic bile duct carcinoma: Secondary | ICD-10-CM

## 2019-04-30 LAB — CBC WITH DIFFERENTIAL (CANCER CENTER ONLY)
Abs Immature Granulocytes: 0.01 10*3/uL (ref 0.00–0.07)
Basophils Absolute: 0 10*3/uL (ref 0.0–0.1)
Basophils Relative: 2 %
Eosinophils Absolute: 0 10*3/uL (ref 0.0–0.5)
Eosinophils Relative: 2 %
HCT: 35.7 % — ABNORMAL LOW (ref 36.0–46.0)
Hemoglobin: 11.6 g/dL — ABNORMAL LOW (ref 12.0–15.0)
Immature Granulocytes: 1 %
Lymphocytes Relative: 26 %
Lymphs Abs: 0.5 10*3/uL — ABNORMAL LOW (ref 0.7–4.0)
MCH: 29.2 pg (ref 26.0–34.0)
MCHC: 32.5 g/dL (ref 30.0–36.0)
MCV: 89.9 fL (ref 80.0–100.0)
Monocytes Absolute: 0.4 10*3/uL (ref 0.1–1.0)
Monocytes Relative: 20 %
Neutro Abs: 1 10*3/uL — ABNORMAL LOW (ref 1.7–7.7)
Neutrophils Relative %: 49 %
Platelet Count: 289 10*3/uL (ref 150–400)
RBC: 3.97 MIL/uL (ref 3.87–5.11)
RDW: 18.8 % — ABNORMAL HIGH (ref 11.5–15.5)
WBC Count: 2 10*3/uL — ABNORMAL LOW (ref 4.0–10.5)
nRBC: 0 % (ref 0.0–0.2)

## 2019-04-30 LAB — CMP (CANCER CENTER ONLY)
ALT: 14 U/L (ref 0–44)
AST: 19 U/L (ref 15–41)
Albumin: 3.8 g/dL (ref 3.5–5.0)
Alkaline Phosphatase: 142 U/L — ABNORMAL HIGH (ref 38–126)
Anion gap: 8 (ref 5–15)
BUN: 11 mg/dL (ref 8–23)
CO2: 27 mmol/L (ref 22–32)
Calcium: 9.7 mg/dL (ref 8.9–10.3)
Chloride: 106 mmol/L (ref 98–111)
Creatinine: 0.72 mg/dL (ref 0.44–1.00)
GFR, Est AFR Am: >60 mL/min (ref >60)
GFR, Estimated: >60 mL/min (ref >60)
Glucose, Bld: 95 mg/dL (ref 70–99)
Potassium: 3.9 mmol/L (ref 3.5–5.1)
Sodium: 141 mmol/L (ref 135–145)
Total Bilirubin: 0.3 mg/dL (ref 0.3–1.2)
Total Protein: 6.8 g/dL (ref 6.5–8.1)

## 2019-04-30 LAB — MAGNESIUM: Magnesium: 1.9 mg/dL (ref 1.7–2.4)

## 2019-04-30 MED ORDER — SODIUM CHLORIDE 0.9% FLUSH
10.0000 mL | Freq: Once | INTRAVENOUS | Status: AC
  Administered 2019-04-30: 10 mL
  Filled 2019-04-30: qty 10

## 2019-04-30 MED ORDER — SODIUM CHLORIDE 0.9% FLUSH
10.0000 mL | Freq: Once | INTRAVENOUS | Status: AC
  Administered 2019-04-30: 10 mL via INTRAVENOUS
  Filled 2019-04-30: qty 10

## 2019-04-30 MED ORDER — HEPARIN SOD (PORK) LOCK FLUSH 100 UNIT/ML IV SOLN
500.0000 [IU] | Freq: Once | INTRAVENOUS | Status: AC
  Administered 2019-04-30: 500 [IU] via INTRAVENOUS
  Filled 2019-04-30: qty 5

## 2019-04-30 NOTE — Progress Notes (Signed)
ANC today 1.0. Per Dr. Benay Spice: delay tx till next week and will add Udenyca to regimen. Scheduling message sent and notified managed care of Udenyca addtion.

## 2019-04-30 NOTE — Telephone Encounter (Signed)
Scheduled appt per 5/6 sch message - pt aware of change. Request that I call house number to leave message on vmail with appts.

## 2019-04-30 NOTE — Patient Instructions (Signed)
Pegfilgrastim injection(Udenyca) What is this medicine? PEGFILGRASTIM (PEG fil gra stim) is a long-acting granulocyte colony-stimulating factor that stimulates the growth of neutrophils, a type of white blood cell important in the body's fight against infection. It is used to reduce the incidence of fever and infection in patients with certain types of cancer who are receiving chemotherapy that affects the bone marrow, and to increase survival after being exposed to high doses of radiation. This medicine may be used for other purposes; ask your health care provider or pharmacist if you have questions. COMMON BRAND NAME(S): Domenic Moras, UDENYCA What should I tell my health care provider before I take this medicine? They need to know if you have any of these conditions: -kidney disease -latex allergy -ongoing radiation therapy -sickle cell disease -skin reactions to acrylic adhesives (On-Body Injector only) -an unusual or allergic reaction to pegfilgrastim, filgrastim, other medicines, foods, dyes, or preservatives -pregnant or trying to get pregnant -breast-feeding How should I use this medicine? This medicine is for injection under the skin. If you get this medicine at home, you will be taught how to prepare and give the pre-filled syringe or how to use the On-body Injector. Refer to the patient Instructions for Use for detailed instructions. Use exactly as directed. Tell your healthcare provider immediately if you suspect that the On-body Injector may not have performed as intended or if you suspect the use of the On-body Injector resulted in a missed or partial dose. It is important that you put your used needles and syringes in a special sharps container. Do not put them in a trash can. If you do not have a sharps container, call your pharmacist or healthcare provider to get one. Talk to your pediatrician regarding the use of this medicine in children. While this drug may be prescribed  for selected conditions, precautions do apply. Overdosage: If you think you have taken too much of this medicine contact a poison control center or emergency room at once. NOTE: This medicine is only for you. Do not share this medicine with others. What if I miss a dose? It is important not to miss your dose. Call your doctor or health care professional if you miss your dose. If you miss a dose due to an On-body Injector failure or leakage, a new dose should be administered as soon as possible using a single prefilled syringe for manual use. What may interact with this medicine? Interactions have not been studied. Give your health care provider a list of all the medicines, herbs, non-prescription drugs, or dietary supplements you use. Also tell them if you smoke, drink alcohol, or use illegal drugs. Some items may interact with your medicine. This list may not describe all possible interactions. Give your health care provider a list of all the medicines, herbs, non-prescription drugs, or dietary supplements you use. Also tell them if you smoke, drink alcohol, or use illegal drugs. Some items may interact with your medicine. What should I watch for while using this medicine? You may need blood work done while you are taking this medicine. If you are going to need a MRI, CT scan, or other procedure, tell your doctor that you are using this medicine (On-Body Injector only). What side effects may I notice from receiving this medicine? Side effects that you should report to your doctor or health care professional as soon as possible: -allergic reactions like skin rash, itching or hives, swelling of the face, lips, or tongue -back pain -dizziness -fever -pain, redness,  or irritation at site where injected -pinpoint red spots on the skin -red or dark-brown urine -shortness of breath or breathing problems -stomach or side pain, or pain at the shoulder -swelling -tiredness -trouble passing urine or  change in the amount of urine Side effects that usually do not require medical attention (report to your doctor or health care professional if they continue or are bothersome): -bone pain -muscle pain This list may not describe all possible side effects. Call your doctor for medical advice about side effects. You may report side effects to FDA at 1-800-FDA-1088. Where should I keep my medicine? Keep out of the reach of children. If you are using this medicine at home, you will be instructed on how to store it. Throw away any unused medicine after the expiration date on the label. NOTE: This sheet is a summary. It may not cover all possible information. If you have questions about this medicine, talk to your doctor, pharmacist, or health care provider.  2019 Elsevier/Gold Standard (2018-03-18 16:57:08)

## 2019-05-07 ENCOUNTER — Other Ambulatory Visit: Payer: Self-pay | Admitting: Nurse Practitioner

## 2019-05-07 ENCOUNTER — Other Ambulatory Visit: Payer: Self-pay

## 2019-05-07 ENCOUNTER — Inpatient Hospital Stay: Payer: PPO

## 2019-05-07 VITALS — BP 151/55 | HR 71 | Temp 97.8°F | Resp 18 | Wt 143.0 lb

## 2019-05-07 DIAGNOSIS — C221 Intrahepatic bile duct carcinoma: Secondary | ICD-10-CM

## 2019-05-07 DIAGNOSIS — Z5111 Encounter for antineoplastic chemotherapy: Secondary | ICD-10-CM | POA: Diagnosis not present

## 2019-05-07 DIAGNOSIS — Z95828 Presence of other vascular implants and grafts: Secondary | ICD-10-CM

## 2019-05-07 LAB — CBC WITH DIFFERENTIAL (CANCER CENTER ONLY)
Abs Immature Granulocytes: 0.01 10*3/uL (ref 0.00–0.07)
Basophils Absolute: 0 10*3/uL (ref 0.0–0.1)
Basophils Relative: 1 %
Eosinophils Absolute: 0 10*3/uL (ref 0.0–0.5)
Eosinophils Relative: 1 %
HCT: 36.7 % (ref 36.0–46.0)
Hemoglobin: 12.2 g/dL (ref 12.0–15.0)
Immature Granulocytes: 0 %
Lymphocytes Relative: 11 %
Lymphs Abs: 0.6 10*3/uL — ABNORMAL LOW (ref 0.7–4.0)
MCH: 29.8 pg (ref 26.0–34.0)
MCHC: 33.2 g/dL (ref 30.0–36.0)
MCV: 89.5 fL (ref 80.0–100.0)
Monocytes Absolute: 0.7 10*3/uL (ref 0.1–1.0)
Monocytes Relative: 13 %
Neutro Abs: 4 10*3/uL (ref 1.7–7.7)
Neutrophils Relative %: 74 %
Platelet Count: 204 10*3/uL (ref 150–400)
RBC: 4.1 MIL/uL (ref 3.87–5.11)
RDW: 18.1 % — ABNORMAL HIGH (ref 11.5–15.5)
WBC Count: 5.4 10*3/uL (ref 4.0–10.5)
nRBC: 0 % (ref 0.0–0.2)

## 2019-05-07 LAB — CMP (CANCER CENTER ONLY)
ALT: 13 U/L (ref 0–44)
AST: 22 U/L (ref 15–41)
Albumin: 3.9 g/dL (ref 3.5–5.0)
Alkaline Phosphatase: 141 U/L — ABNORMAL HIGH (ref 38–126)
Anion gap: 9 (ref 5–15)
BUN: 10 mg/dL (ref 8–23)
CO2: 27 mmol/L (ref 22–32)
Calcium: 9.9 mg/dL (ref 8.9–10.3)
Chloride: 105 mmol/L (ref 98–111)
Creatinine: 0.67 mg/dL (ref 0.44–1.00)
GFR, Est AFR Am: >60 mL/min
GFR, Estimated: >60 mL/min
Glucose, Bld: 92 mg/dL (ref 70–99)
Potassium: 3.9 mmol/L (ref 3.5–5.1)
Sodium: 141 mmol/L (ref 135–145)
Total Bilirubin: 0.4 mg/dL (ref 0.3–1.2)
Total Protein: 7.1 g/dL (ref 6.5–8.1)

## 2019-05-07 LAB — MAGNESIUM: Magnesium: 2 mg/dL (ref 1.7–2.4)

## 2019-05-07 MED ORDER — SODIUM CHLORIDE 0.9% FLUSH
10.0000 mL | Freq: Once | INTRAVENOUS | Status: AC
  Administered 2019-05-07: 10 mL
  Filled 2019-05-07: qty 10

## 2019-05-07 MED ORDER — SODIUM CHLORIDE 0.9 % IV SOLN
20.0000 mg/m2 | Freq: Once | INTRAVENOUS | Status: AC
  Administered 2019-05-07: 13:00:00 33 mg via INTRAVENOUS
  Filled 2019-05-07: qty 33

## 2019-05-07 MED ORDER — SODIUM CHLORIDE 0.9% FLUSH
10.0000 mL | INTRAVENOUS | Status: DC | PRN
  Administered 2019-05-07: 10 mL
  Filled 2019-05-07: qty 10

## 2019-05-07 MED ORDER — SODIUM CHLORIDE 0.9 % IV SOLN
500.0000 mg/m2 | Freq: Once | INTRAVENOUS | Status: AC
  Administered 2019-05-07: 836 mg via INTRAVENOUS
  Filled 2019-05-07: qty 21.99

## 2019-05-07 MED ORDER — POTASSIUM CHLORIDE 2 MEQ/ML IV SOLN
Freq: Once | INTRAVENOUS | Status: AC
  Administered 2019-05-07: 09:00:00 via INTRAVENOUS
  Filled 2019-05-07: qty 10

## 2019-05-07 MED ORDER — HEPARIN SOD (PORK) LOCK FLUSH 100 UNIT/ML IV SOLN
500.0000 [IU] | Freq: Once | INTRAVENOUS | Status: AC | PRN
  Administered 2019-05-07: 500 [IU]
  Filled 2019-05-07: qty 5

## 2019-05-07 MED ORDER — SODIUM CHLORIDE 0.9 % IV SOLN
Freq: Once | INTRAVENOUS | Status: AC
  Administered 2019-05-07: 09:00:00 via INTRAVENOUS
  Filled 2019-05-07: qty 250

## 2019-05-07 MED ORDER — PALONOSETRON HCL INJECTION 0.25 MG/5ML
INTRAVENOUS | Status: AC
  Filled 2019-05-07: qty 5

## 2019-05-07 MED ORDER — SODIUM CHLORIDE 0.9 % IV SOLN
Freq: Once | INTRAVENOUS | Status: AC
  Administered 2019-05-07: 12:00:00 via INTRAVENOUS
  Filled 2019-05-07: qty 5

## 2019-05-07 MED ORDER — PALONOSETRON HCL INJECTION 0.25 MG/5ML
0.2500 mg | Freq: Once | INTRAVENOUS | Status: AC
  Administered 2019-05-07: 0.25 mg via INTRAVENOUS

## 2019-05-07 NOTE — Patient Instructions (Signed)
Coronavirus (COVID-19) Are you at risk?  Are you at risk for the Coronavirus (COVID-19)?  To be considered HIGH RISK for Coronavirus (COVID-19), you have to meet the following criteria:  . Traveled to [place], [place], [place], [place] or [place]; or in the [place] to [place], [place], [place], or [place]; and have fever, cough, and shortness of breath within the last 2 weeks of travel OR . Been in close contact with a person diagnosed with COVID-19 within the last 2 weeks and have fever, cough, and shortness of breath . IF YOU DO NOT MEET THESE CRITERIA, YOU ARE CONSIDERED LOW RISK FOR COVID-19.  What to do if you are HIGH RISK for COVID-19?  . If you are having a medical emergency, call 911. . Seek medical care right away. Before you go to a doctor's office, urgent care or emergency department, call ahead and tell them about your recent travel, contact with someone diagnosed with COVID-19, and your symptoms. You should receive instructions from your physician's office regarding next steps of care.  . When you arrive at healthcare provider, tell the healthcare staff immediately you have returned from visiting [place], [place], [place], [place] or [place]; or traveled in the [place] to [place], [place], [place], or [place]; in the last two weeks or you have been in close contact with a person diagnosed with COVID-19 in the last 2 weeks.   . Tell the health care staff about your symptoms: fever, cough and shortness of breath. . After you have been seen by a medical provider, you will be either: o Tested for (COVID-19) and discharged home on quarantine except to seek medical care if symptoms worsen, and asked to  - Stay home and avoid contact with others until you get your results (4-5 days)  - Avoid travel on public transportation if possible (such as bus, train, or airplane) or o Sent to the Emergency Department by EMS for evaluation, COVID-19 testing, and possible  admission depending on your condition and test results.  What to do if you are LOW RISK for COVID-19?  Reduce your risk of any infection by using the same precautions used for avoiding the common cold or flu:  . Wash your hands often with soap and warm water for at least 20 seconds.  If soap and water are not readily available, use an alcohol-based hand sanitizer with at least 60% alcohol.  . If coughing or sneezing, cover your mouth and nose by coughing or sneezing into the elbow areas of your shirt or coat, into a tissue or into your sleeve (not your hands). . Avoid shaking hands with others and consider head nods or verbal greetings only. . Avoid touching your eyes, nose, or mouth with unwashed hands.  . Avoid close contact with people who are sick. . Avoid places or events with large numbers of people in one location, like concerts or sporting events. . Carefully consider travel plans you have or are making. . If you are planning any travel outside or inside the [place], visit the CDC's Travelers' Health webpage for the latest health notices. . If you have some symptoms but not all symptoms, continue to monitor at home and seek medical attention if your symptoms worsen. . If you are having a medical emergency, call 911.   ADDITIONAL HEALTHCARE OPTIONS FOR PATIENTS  [facility] / e-Visit: [email or web address]         [facility]: [phone]  On[facility]   Urgent Care: [facility]: [facility] Discharge Instructions for Patients Receiving Chemotherapy  Today you received the following chemotherapy agents: gemcitabine (Gemzar) and cisplatin (Platinol).  To help prevent nausea and vomiting after your treatment, we encourage you to take your nausea medication as prescribed by your physician.   If you develop nausea and vomiting that is not controlled  by your nausea medication, call the clinic.   BELOW ARE SYMPTOMS THAT SHOULD BE REPORTED IMMEDIATELY:  *FEVER GREATER THAN 100.5 F  *CHILLS WITH OR WITHOUT FEVER  NAUSEA AND VOMITING THAT IS NOT CONTROLLED WITH YOUR NAUSEA MEDICATION  *UNUSUAL SHORTNESS OF BREATH  *UNUSUAL BRUISING OR BLEEDING  TENDERNESS IN MOUTH AND THROAT WITH OR WITHOUT PRESENCE OF ULCERS  *URINARY PROBLEMS  *BOWEL PROBLEMS  UNUSUAL RASH Items with * indicate a potential emergency and should be followed up as soon as possible.  Feel free to call the clinic should you have any questions or concerns. The clinic phone number is [phone].  Please show the [facility] at check-in to the Emergency Department and triage nurse.

## 2019-05-08 ENCOUNTER — Other Ambulatory Visit: Payer: Self-pay

## 2019-05-08 ENCOUNTER — Inpatient Hospital Stay: Payer: PPO

## 2019-05-08 VITALS — BP 148/62 | HR 72 | Temp 98.2°F | Resp 17

## 2019-05-08 DIAGNOSIS — C221 Intrahepatic bile duct carcinoma: Secondary | ICD-10-CM

## 2019-05-08 DIAGNOSIS — Z5111 Encounter for antineoplastic chemotherapy: Secondary | ICD-10-CM | POA: Diagnosis not present

## 2019-05-08 MED ORDER — PEGFILGRASTIM-CBQV 6 MG/0.6ML ~~LOC~~ SOSY
6.0000 mg | PREFILLED_SYRINGE | Freq: Once | SUBCUTANEOUS | Status: AC
  Administered 2019-05-08: 15:00:00 6 mg via SUBCUTANEOUS

## 2019-05-08 NOTE — Patient Instructions (Signed)
Pegfilgrastim injection  What is this medicine?  PEGFILGRASTIM (PEG fil gra stim) is a long-acting granulocyte colony-stimulating factor that stimulates the growth of neutrophils, a type of white blood cell important in the body's fight against infection. It is used to reduce the incidence of fever and infection in patients with certain types of cancer who are receiving chemotherapy that affects the bone marrow, and to increase survival after being exposed to high doses of radiation.  This medicine may be used for other purposes; ask your health care provider or pharmacist if you have questions.  COMMON BRAND NAME(S): Fulphila, Neulasta, UDENYCA  What should I tell my health care provider before I take this medicine?  They need to know if you have any of these conditions:  -kidney disease  -latex allergy  -ongoing radiation therapy  -sickle cell disease  -skin reactions to acrylic adhesives (On-Body Injector only)  -an unusual or allergic reaction to pegfilgrastim, filgrastim, other medicines, foods, dyes, or preservatives  -pregnant or trying to get pregnant  -breast-feeding  How should I use this medicine?  This medicine is for injection under the skin. If you get this medicine at home, you will be taught how to prepare and give the pre-filled syringe or how to use the On-body Injector. Refer to the patient Instructions for Use for detailed instructions. Use exactly as directed. Tell your healthcare provider immediately if you suspect that the On-body Injector may not have performed as intended or if you suspect the use of the On-body Injector resulted in a missed or partial dose.  It is important that you put your used needles and syringes in a special sharps container. Do not put them in a trash can. If you do not have a sharps container, call your pharmacist or healthcare provider to get one.  Talk to your pediatrician regarding the use of this medicine in children. While this drug may be prescribed for  selected conditions, precautions do apply.  Overdosage: If you think you have taken too much of this medicine contact a poison control center or emergency room at once.  NOTE: This medicine is only for you. Do not share this medicine with others.  What if I miss a dose?  It is important not to miss your dose. Call your doctor or health care professional if you miss your dose. If you miss a dose due to an On-body Injector failure or leakage, a new dose should be administered as soon as possible using a single prefilled syringe for manual use.  What may interact with this medicine?  Interactions have not been studied.  Give your health care provider a list of all the medicines, herbs, non-prescription drugs, or dietary supplements you use. Also tell them if you smoke, drink alcohol, or use illegal drugs. Some items may interact with your medicine.  This list may not describe all possible interactions. Give your health care provider a list of all the medicines, herbs, non-prescription drugs, or dietary supplements you use. Also tell them if you smoke, drink alcohol, or use illegal drugs. Some items may interact with your medicine.  What should I watch for while using this medicine?  You may need blood work done while you are taking this medicine.  If you are going to need a MRI, CT scan, or other procedure, tell your doctor that you are using this medicine (On-Body Injector only).  What side effects may I notice from receiving this medicine?  Side effects that you should report to   your doctor or health care professional as soon as possible:  -allergic reactions like skin rash, itching or hives, swelling of the face, lips, or tongue  -back pain  -dizziness  -fever  -pain, redness, or irritation at site where injected  -pinpoint red spots on the skin  -red or dark-brown urine  -shortness of breath or breathing problems  -stomach or side pain, or pain at the shoulder  -swelling  -tiredness  -trouble passing urine or  change in the amount of urine  Side effects that usually do not require medical attention (report to your doctor or health care professional if they continue or are bothersome):  -bone pain  -muscle pain  This list may not describe all possible side effects. Call your doctor for medical advice about side effects. You may report side effects to FDA at 1-800-FDA-1088.  Where should I keep my medicine?  Keep out of the reach of children.  If you are using this medicine at home, you will be instructed on how to store it. Throw away any unused medicine after the expiration date on the label.  NOTE: This sheet is a summary. It may not cover all possible information. If you have questions about this medicine, talk to your doctor, pharmacist, or health care provider.   2019 Elsevier/Gold Standard (2018-03-18 16:57:08)

## 2019-05-21 ENCOUNTER — Other Ambulatory Visit: Payer: PPO

## 2019-05-21 ENCOUNTER — Ambulatory Visit: Payer: PPO

## 2019-05-21 ENCOUNTER — Ambulatory Visit: Payer: PPO | Admitting: Oncology

## 2019-05-25 ENCOUNTER — Other Ambulatory Visit: Payer: Self-pay | Admitting: Oncology

## 2019-05-28 ENCOUNTER — Telehealth: Payer: Self-pay | Admitting: Oncology

## 2019-05-28 ENCOUNTER — Other Ambulatory Visit: Payer: Self-pay

## 2019-05-28 ENCOUNTER — Inpatient Hospital Stay: Payer: PPO | Attending: Oncology

## 2019-05-28 ENCOUNTER — Inpatient Hospital Stay (HOSPITAL_BASED_OUTPATIENT_CLINIC_OR_DEPARTMENT_OTHER): Payer: PPO | Admitting: Oncology

## 2019-05-28 ENCOUNTER — Inpatient Hospital Stay: Payer: PPO

## 2019-05-28 VITALS — BP 137/70 | HR 86 | Temp 98.5°F | Resp 18 | Ht 65.0 in | Wt 133.1 lb

## 2019-05-28 DIAGNOSIS — C221 Intrahepatic bile duct carcinoma: Secondary | ICD-10-CM

## 2019-05-28 DIAGNOSIS — R110 Nausea: Secondary | ICD-10-CM | POA: Insufficient documentation

## 2019-05-28 DIAGNOSIS — R630 Anorexia: Secondary | ICD-10-CM | POA: Diagnosis not present

## 2019-05-28 DIAGNOSIS — D701 Agranulocytosis secondary to cancer chemotherapy: Secondary | ICD-10-CM | POA: Insufficient documentation

## 2019-05-28 DIAGNOSIS — T451X5S Adverse effect of antineoplastic and immunosuppressive drugs, sequela: Secondary | ICD-10-CM | POA: Diagnosis not present

## 2019-05-28 DIAGNOSIS — Z5111 Encounter for antineoplastic chemotherapy: Secondary | ICD-10-CM | POA: Diagnosis not present

## 2019-05-28 DIAGNOSIS — D6959 Other secondary thrombocytopenia: Secondary | ICD-10-CM | POA: Insufficient documentation

## 2019-05-28 DIAGNOSIS — Z95828 Presence of other vascular implants and grafts: Secondary | ICD-10-CM

## 2019-05-28 DIAGNOSIS — Z79899 Other long term (current) drug therapy: Secondary | ICD-10-CM | POA: Diagnosis not present

## 2019-05-28 LAB — BASIC METABOLIC PANEL - CANCER CENTER ONLY
Anion gap: 10 (ref 5–15)
BUN: 9 mg/dL (ref 8–23)
CO2: 26 mmol/L (ref 22–32)
Calcium: 9.9 mg/dL (ref 8.9–10.3)
Chloride: 103 mmol/L (ref 98–111)
Creatinine: 0.65 mg/dL (ref 0.44–1.00)
GFR, Est AFR Am: >60 mL/min (ref >60)
GFR, Estimated: >60 mL/min (ref >60)
Glucose, Bld: 107 mg/dL — ABNORMAL HIGH (ref 70–99)
Potassium: 3.9 mmol/L (ref 3.5–5.1)
Sodium: 139 mmol/L (ref 135–145)

## 2019-05-28 LAB — CBC WITH DIFFERENTIAL (CANCER CENTER ONLY)
Abs Immature Granulocytes: 0.01 10*3/uL (ref 0.00–0.07)
Basophils Absolute: 0.1 10*3/uL (ref 0.0–0.1)
Basophils Relative: 2 %
Eosinophils Absolute: 0 10*3/uL (ref 0.0–0.5)
Eosinophils Relative: 1 %
HCT: 36.7 % (ref 36.0–46.0)
Hemoglobin: 12.1 g/dL (ref 12.0–15.0)
Immature Granulocytes: 0 %
Lymphocytes Relative: 17 %
Lymphs Abs: 0.7 10*3/uL (ref 0.7–4.0)
MCH: 30.5 pg (ref 26.0–34.0)
MCHC: 33 g/dL (ref 30.0–36.0)
MCV: 92.4 fL (ref 80.0–100.0)
Monocytes Absolute: 0.6 10*3/uL (ref 0.1–1.0)
Monocytes Relative: 15 %
Neutro Abs: 2.7 10*3/uL (ref 1.7–7.7)
Neutrophils Relative %: 65 %
Platelet Count: 477 10*3/uL — ABNORMAL HIGH (ref 150–400)
RBC: 3.97 MIL/uL (ref 3.87–5.11)
RDW: 15.4 % (ref 11.5–15.5)
WBC Count: 4.1 10*3/uL (ref 4.0–10.5)
nRBC: 0 % (ref 0.0–0.2)

## 2019-05-28 MED ORDER — SODIUM CHLORIDE 0.9% FLUSH
10.0000 mL | Freq: Once | INTRAVENOUS | Status: AC
  Administered 2019-05-28: 10 mL
  Filled 2019-05-28: qty 10

## 2019-05-28 MED ORDER — POTASSIUM CHLORIDE 2 MEQ/ML IV SOLN
Freq: Once | INTRAVENOUS | Status: AC
  Administered 2019-05-28: 10:00:00 via INTRAVENOUS
  Filled 2019-05-28: qty 10

## 2019-05-28 MED ORDER — SODIUM CHLORIDE 0.9 % IV SOLN
20.0000 mg/m2 | Freq: Once | INTRAVENOUS | Status: AC
  Administered 2019-05-28: 33 mg via INTRAVENOUS
  Filled 2019-05-28: qty 33

## 2019-05-28 MED ORDER — SODIUM CHLORIDE 0.9 % IV SOLN
Freq: Once | INTRAVENOUS | Status: AC
  Administered 2019-05-28: 12:00:00 via INTRAVENOUS
  Filled 2019-05-28: qty 5

## 2019-05-28 MED ORDER — HEPARIN SOD (PORK) LOCK FLUSH 100 UNIT/ML IV SOLN
500.0000 [IU] | Freq: Once | INTRAVENOUS | Status: AC | PRN
  Administered 2019-05-28: 17:00:00 500 [IU]
  Filled 2019-05-28: qty 5

## 2019-05-28 MED ORDER — PALONOSETRON HCL INJECTION 0.25 MG/5ML
INTRAVENOUS | Status: AC
  Filled 2019-05-28: qty 5

## 2019-05-28 MED ORDER — PALONOSETRON HCL INJECTION 0.25 MG/5ML
0.2500 mg | Freq: Once | INTRAVENOUS | Status: AC
  Administered 2019-05-28: 0.25 mg via INTRAVENOUS

## 2019-05-28 MED ORDER — SODIUM CHLORIDE 0.9 % IV SOLN
500.0000 mg/m2 | Freq: Once | INTRAVENOUS | Status: AC
  Administered 2019-05-28: 836 mg via INTRAVENOUS
  Filled 2019-05-28: qty 21.99

## 2019-05-28 MED ORDER — SODIUM CHLORIDE 0.9 % IV SOLN
Freq: Once | INTRAVENOUS | Status: AC
  Administered 2019-05-28: 10:00:00 via INTRAVENOUS
  Filled 2019-05-28: qty 250

## 2019-05-28 MED ORDER — SODIUM CHLORIDE 0.9% FLUSH
10.0000 mL | INTRAVENOUS | Status: DC | PRN
  Administered 2019-05-28: 10 mL
  Filled 2019-05-28: qty 10

## 2019-05-28 NOTE — Addendum Note (Signed)
Addended by: Tania Ade on: 05/28/2019 09:09 AM   Modules accepted: Orders

## 2019-05-28 NOTE — Telephone Encounter (Signed)
Scheduled appt per 6/3 los. °

## 2019-05-28 NOTE — Progress Notes (Signed)
Laura [name] OFFICE PROGRESS NOTE   Diagnosis: Liver mass  INTERVAL HISTORY:   Laura [name] returns as scheduled.  She completed another cycle of gemcitabine/cisplatin on 05/07/2019.  She received Udenyca on 05/08/2019.  No fever, rash, or neuropathy symptoms.  No abdominal pain.  She has intermittent mild nausea in the mornings, relieved with antiemetics.  She is eating.    Objective:  Vital signs in last 24 hours:  Blood pressure 137/70, pulse 86, temperature 98.5 F (36.9 C), temperature source Temporal, resp. rate 18, height '5\' 5"'  (1.651 m), weight 133 lb 1.6 oz (60.4 kg), SpO2 98 %.    GI: Soft, nontender, no mass, no hepatosplenomegaly Vascular: No leg edema  Skin: Resolving ecchymosis of the left pretibial area  Portacath/PICC-without erythema  Lab Results:  Lab Results  Component Value Date   WBC 5.4 05/07/2019   HGB 12.2 05/07/2019   HCT 36.7 05/07/2019   MCV 89.5 05/07/2019   PLT 204 05/07/2019   NEUTROABS 4.0 05/07/2019    CMP  Lab Results  Component Value Date   NA 139 05/28/2019   K 3.9 05/28/2019   CL 103 05/28/2019   CO2 26 05/28/2019   GLUCOSE 107 (H) 05/28/2019   BUN 9 05/28/2019   CREATININE 0.65 05/28/2019   CALCIUM 9.9 05/28/2019   PROT 7.1 05/07/2019   ALBUMIN 3.9 05/07/2019   AST 22 05/07/2019   ALT 13 05/07/2019   ALKPHOS 141 (H) 05/07/2019   BILITOT 0.4 05/07/2019   GFRNONAA >60 05/28/2019   GFRAA >60 05/28/2019    Lab Results  Component Value Date   CEA1 <1.00 02/21/2019     Medications: I have reviewed the patient's current medications.   Assessment/Plan: 1. Right liver mass-biopsy 07/10/2016 confirmed adenocarcinoma, PDL 1 0%, MSS, tumor mutation burden 3, IDH1 and PIK3 CA alterations  CT abdomen/pelvis 06/17/2016 and MRI abdomen 06/21/2016 confirmed an isolated right liver mass and a cystic pancreas lesion  PET scan 07/28/2016-the hepatic lesion is not hypermetabolic, hypermetabolic lesion in the gastric  antrum suspicious for a primary neoplasm and no other evidence of metastatic disease  Upper endoscopy 08/03/2016-no mass found, biopsy from the gastric antrum-benign  CT-guided ablation of the right liver lesion 11/03/2016  MRI abdomen 03/08/2017-ablation defect in the right liver without residual/recurrent disease  MRI liver 06/11/2017-multiple new enhancing lesions in the right hepatic lobe surrounding the ablation defect consistent with progressive cholangiocarcinoma  Y-90 radioembolization of the right liver 07/19/2017  MRI abdomen 10/09/2017-suspected progression of intrahepatic larger carcinoma within segment 6, reviewed by interventional radiology most consistent with pseudo-progression  MRI abdomen 01/08/2018-slight decrease in the segment 6 mass, no change in enhancement at the ablation bed, new tiny arterial phase area of hyperenhancement in the lateral left liver-potentially a vascular phenomena  MRI abdomen 04/03/2018-stable segment 6 mass, no change in 10 mm flash filling segment 3 lesion-potentially a vascular shunt, a porta hepatis node measures 2.2 cm compared to 1.5 cm, no new lymphadenopathy  MRI abdomen 07/03/2018 stable segment 6 liver mass, stable segment 3 arterial phase enhancing lesion stable porta hepatis adenopathy, stable pancreas cystic lesion  MRI abdomen 11/20/2018- new 2.1 cm lateral left liver lesion, new 8 mm medial left liver lesion, no change in posterior right hepatic mass, no change in necrotic hepato-duodenal ligament lymph node no change in cystic pancreas lesion  Y 90 of the left liver 01/16/2019  Cycle 1 gemcitabine/cisplatin 03/12/2019  Cycle 2 gemcitabine/cisplatin 04/09/2019 (doses reduced due to neutropenia)  Cycle 3 gemcitabine/cisplatin 05/07/2019  Cycle 4 gemcitabine/cisplatin 05/28/2019 2. Left leg and right foot pain  Negative left leg Doppler 07/14/2016   3. Right abdominal pain- potentially related to the dominant right liver mass   4. Anorexia/weight loss  5. Benign appearing 16 mm cystic uncinatepancreas lesion noted on MRI 06/20/2016   Slightly larger on an MRI 03/08/2017, felt to most likely represent a side branch intraductal papillary mucinous neoplasm  Stable on MRI 10/09/2017  Stable on MRI 01/08/2018  Stable on MRI 04/03/2018  Stable on MRI 07/03/2018  Stable on MRI 11/20/2018  6. Anemia-iron deficiency  7.  Port-A-Cath placement interventional radiology 03/04/2019  8.  Neutropenia secondary to chemotherapy- gemcitabine and cisplatin dose reduced beginning with cycle 2; mild neutropenia and thrombocytopenia 04/22/2019.  Gemcitabine and cisplatin held.  Treatment changed to every 3 weeks beginning on 05/14/2019.   Disposition: Laura [name] appears to be tolerating the chemotherapy well.  Her clinical status is stable.  She will complete another cycle of chemotherapy today.  The plan is to complete 5 cycles of chemotherapy prior to a restaging evaluation.  We will most likely obtain a liver MRI.  [name], MD  05/28/2019  9:05 AM

## 2019-05-28 NOTE — Patient Instructions (Signed)
Coronavirus (COVID-19) Are you at risk?  Are you at risk for the Coronavirus (COVID-19)?  To be considered HIGH RISK for Coronavirus (COVID-19), you have to meet the following criteria:  . Traveled to [place], [place], [place], [place] or [place]; or in the [place] to [place], [place], [place], or [place]; and have fever, cough, and shortness of breath within the last 2 weeks of travel OR . Been in close contact with a person diagnosed with COVID-19 within the last 2 weeks and have fever, cough, and shortness of breath . IF YOU DO NOT MEET THESE CRITERIA, YOU ARE CONSIDERED LOW RISK FOR COVID-19.  What to do if you are HIGH RISK for COVID-19?  . If you are having a medical emergency, call 911. . Seek medical care right away. Before you go to a doctor's office, urgent care or emergency department, call ahead and tell them about your recent travel, contact with someone diagnosed with COVID-19, and your symptoms. You should receive instructions from your physician's office regarding next steps of care.  . When you arrive at healthcare provider, tell the healthcare staff immediately you have returned from visiting [place], [place], [place], [place] or [place]; or traveled in the [place] to [place], [place], [place], or [place]; in the last two weeks or you have been in close contact with a person diagnosed with COVID-19 in the last 2 weeks.   . Tell the health care staff about your symptoms: fever, cough and shortness of breath. . After you have been seen by a medical provider, you will be either: o Tested for (COVID-19) and discharged home on quarantine except to seek medical care if symptoms worsen, and asked to  - Stay home and avoid contact with others until you get your results (4-5 days)  - Avoid travel on public transportation if possible (such as bus, train, or airplane) or o Sent to the Emergency Department by EMS for evaluation, COVID-19 testing, and possible  admission depending on your condition and test results.  What to do if you are LOW RISK for COVID-19?  Reduce your risk of any infection by using the same precautions used for avoiding the common cold or flu:  . Wash your hands often with soap and warm water for at least 20 seconds.  If soap and water are not readily available, use an alcohol-based hand sanitizer with at least 60% alcohol.  . If coughing or sneezing, cover your mouth and nose by coughing or sneezing into the elbow areas of your shirt or coat, into a tissue or into your sleeve (not your hands). . Avoid shaking hands with others and consider head nods or verbal greetings only. . Avoid touching your eyes, nose, or mouth with unwashed hands.  . Avoid close contact with people who are sick. . Avoid places or events with large numbers of people in one location, like concerts or sporting events. . Carefully consider travel plans you have or are making. . If you are planning any travel outside or inside the [place], visit the CDC's Travelers' Health webpage for the latest health notices. . If you have some symptoms but not all symptoms, continue to monitor at home and seek medical attention if your symptoms worsen. . If you are having a medical emergency, call 911.   ADDITIONAL HEALTHCARE OPTIONS FOR PATIENTS  [facility] / e-Visit: [email or web address]         [facility]: [phone]  Onset   Urgent Care: [facility]: [place] Discharge Instructions for Patients Receiving Chemotherapy  Today you received the following chemotherapy agents: gemcitabine (Gemzar) and cisplatin (Platinol).  To help prevent nausea and vomiting after your treatment, we encourage you to take your nausea medication as prescribed by your physician.   If you develop nausea and vomiting that is not controlled  by your nausea medication, call the clinic.   BELOW ARE SYMPTOMS THAT SHOULD BE REPORTED IMMEDIATELY:  *FEVER GREATER THAN 100.5 F  *CHILLS WITH OR WITHOUT FEVER  NAUSEA AND VOMITING THAT IS NOT CONTROLLED WITH YOUR NAUSEA MEDICATION  *UNUSUAL SHORTNESS OF BREATH  *UNUSUAL BRUISING OR BLEEDING  TENDERNESS IN MOUTH AND THROAT WITH OR WITHOUT PRESENCE OF ULCERS  *URINARY PROBLEMS  *BOWEL PROBLEMS  UNUSUAL RASH Items with * indicate a potential emergency and should be followed up as soon as possible.  Feel free to call the clinic should you have any questions or concerns. The clinic phone number is [phone].  Please show the [place] at check-in to the Emergency Department and triage nurse.

## 2019-05-29 ENCOUNTER — Other Ambulatory Visit: Payer: Self-pay

## 2019-05-29 ENCOUNTER — Inpatient Hospital Stay: Payer: PPO

## 2019-06-06 ENCOUNTER — Telehealth: Payer: Self-pay | Admitting: *Deleted

## 2019-06-06 NOTE — Telephone Encounter (Signed)
Had chemo 05/28/19 and was supposed to get Udenyca on 6/4, but computers were down, so she left. Nurse said it would be rescheduled, but it was not. Can she have the injection now? Next tx is due 06/18/19. Per Dr. Benay Spice: No, too late for [facility] now. Encouraged her to eat high protein diet to help her body make blood cells. Provided her appointment time for 06/18/19.

## 2019-06-11 ENCOUNTER — Other Ambulatory Visit: Payer: Self-pay | Admitting: *Deleted

## 2019-06-11 DIAGNOSIS — C221 Intrahepatic bile duct carcinoma: Secondary | ICD-10-CM

## 2019-06-11 MED ORDER — CLONAZEPAM 0.5 MG PO TABS: 0.2500 mg | ORAL_TABLET | Freq: Three times a day (TID) | ORAL | 0 refills | Status: DC | PRN

## 2019-06-14 ENCOUNTER — Other Ambulatory Visit: Payer: Self-pay | Admitting: Oncology

## 2019-06-18 ENCOUNTER — Inpatient Hospital Stay: Payer: PPO

## 2019-06-18 ENCOUNTER — Other Ambulatory Visit: Payer: Self-pay

## 2019-06-18 ENCOUNTER — Inpatient Hospital Stay (HOSPITAL_BASED_OUTPATIENT_CLINIC_OR_DEPARTMENT_OTHER): Payer: PPO | Admitting: Oncology

## 2019-06-18 VITALS — BP 137/61 | HR 77 | Temp 98.2°F | Resp 18 | Ht 65.0 in | Wt 133.4 lb

## 2019-06-18 DIAGNOSIS — C221 Intrahepatic bile duct carcinoma: Secondary | ICD-10-CM

## 2019-06-18 DIAGNOSIS — D701 Agranulocytosis secondary to cancer chemotherapy: Secondary | ICD-10-CM | POA: Diagnosis not present

## 2019-06-18 DIAGNOSIS — Z5111 Encounter for antineoplastic chemotherapy: Secondary | ICD-10-CM | POA: Diagnosis not present

## 2019-06-18 DIAGNOSIS — Z95828 Presence of other vascular implants and grafts: Secondary | ICD-10-CM

## 2019-06-18 DIAGNOSIS — T451X5S Adverse effect of antineoplastic and immunosuppressive drugs, sequela: Secondary | ICD-10-CM

## 2019-06-18 DIAGNOSIS — Z79899 Other long term (current) drug therapy: Secondary | ICD-10-CM

## 2019-06-18 LAB — CBC WITH DIFFERENTIAL (CANCER CENTER ONLY)
Abs Immature Granulocytes: 0 10*3/uL (ref 0.00–0.07)
Basophils Absolute: 0 10*3/uL (ref 0.0–0.1)
Basophils Relative: 1 %
Eosinophils Absolute: 0.1 10*3/uL (ref 0.0–0.5)
Eosinophils Relative: 2 %
HCT: 34.5 % — ABNORMAL LOW (ref 36.0–46.0)
Hemoglobin: 11.4 g/dL — ABNORMAL LOW (ref 12.0–15.0)
Immature Granulocytes: 0 %
Lymphocytes Relative: 25 %
Lymphs Abs: 0.6 10*3/uL — ABNORMAL LOW (ref 0.7–4.0)
MCH: 31.1 pg (ref 26.0–34.0)
MCHC: 33 g/dL (ref 30.0–36.0)
MCV: 94.3 fL (ref 80.0–100.0)
Monocytes Absolute: 0.4 10*3/uL (ref 0.1–1.0)
Monocytes Relative: 17 %
Neutro Abs: 1.3 10*3/uL — ABNORMAL LOW (ref 1.7–7.7)
Neutrophils Relative %: 55 %
Platelet Count: 236 10*3/uL (ref 150–400)
RBC: 3.66 MIL/uL — ABNORMAL LOW (ref 3.87–5.11)
RDW: 14.6 % (ref 11.5–15.5)
WBC Count: 2.4 10*3/uL — ABNORMAL LOW (ref 4.0–10.5)
nRBC: 0 % (ref 0.0–0.2)

## 2019-06-18 LAB — CMP (CANCER CENTER ONLY)
ALT: 16 U/L (ref 0–44)
AST: 25 U/L (ref 15–41)
Albumin: 3.9 g/dL (ref 3.5–5.0)
Alkaline Phosphatase: 138 U/L — ABNORMAL HIGH (ref 38–126)
Anion gap: 7 (ref 5–15)
BUN: 14 mg/dL (ref 8–23)
CO2: 28 mmol/L (ref 22–32)
Calcium: 9.4 mg/dL (ref 8.9–10.3)
Chloride: 106 mmol/L (ref 98–111)
Creatinine: 0.75 mg/dL (ref 0.44–1.00)
GFR, Est AFR Am: >60 mL/min (ref >60)
GFR, Estimated: >60 mL/min (ref >60)
Glucose, Bld: 84 mg/dL (ref 70–99)
Potassium: 4 mmol/L (ref 3.5–5.1)
Sodium: 141 mmol/L (ref 135–145)
Total Bilirubin: 0.5 mg/dL (ref 0.3–1.2)
Total Protein: 6.9 g/dL (ref 6.5–8.1)

## 2019-06-18 LAB — MAGNESIUM: Magnesium: 2.1 mg/dL (ref 1.7–2.4)

## 2019-06-18 MED ORDER — SODIUM CHLORIDE 0.9 % IV SOLN
Freq: Once | INTRAVENOUS | Status: AC
  Administered 2019-06-18: 10:00:00 via INTRAVENOUS
  Filled 2019-06-18: qty 250

## 2019-06-18 MED ORDER — SODIUM CHLORIDE 0.9% FLUSH
10.0000 mL | Freq: Once | INTRAVENOUS | Status: AC
  Administered 2019-06-18: 10 mL
  Filled 2019-06-18: qty 10

## 2019-06-18 MED ORDER — SODIUM CHLORIDE 0.9 % IV SOLN
500.0000 mg/m2 | Freq: Once | INTRAVENOUS | Status: AC
  Administered 2019-06-18: 836 mg via INTRAVENOUS
  Filled 2019-06-18: qty 21.99

## 2019-06-18 MED ORDER — HEPARIN SOD (PORK) LOCK FLUSH 100 UNIT/ML IV SOLN
500.0000 [IU] | Freq: Once | INTRAVENOUS | Status: AC | PRN
  Administered 2019-06-18: 500 [IU]
  Filled 2019-06-18: qty 5

## 2019-06-18 MED ORDER — SODIUM CHLORIDE 0.9% FLUSH
10.0000 mL | INTRAVENOUS | Status: DC | PRN
  Administered 2019-06-18: 10 mL
  Filled 2019-06-18: qty 10

## 2019-06-18 MED ORDER — SODIUM CHLORIDE 0.9 % IV SOLN
20.0000 mg/m2 | Freq: Once | INTRAVENOUS | Status: AC
  Administered 2019-06-18: 33 mg via INTRAVENOUS
  Filled 2019-06-18: qty 33

## 2019-06-18 MED ORDER — POTASSIUM CHLORIDE 2 MEQ/ML IV SOLN
Freq: Once | INTRAVENOUS | Status: AC
  Administered 2019-06-18: 10:00:00 via INTRAVENOUS
  Filled 2019-06-18: qty 10

## 2019-06-18 MED ORDER — SODIUM CHLORIDE 0.9 % IV SOLN
Freq: Once | INTRAVENOUS | Status: AC
  Administered 2019-06-18: 12:00:00 via INTRAVENOUS
  Filled 2019-06-18: qty 5

## 2019-06-18 MED ORDER — PALONOSETRON HCL INJECTION 0.25 MG/5ML
INTRAVENOUS | Status: AC
  Filled 2019-06-18: qty 5

## 2019-06-18 MED ORDER — PALONOSETRON HCL INJECTION 0.25 MG/5ML
0.2500 mg | Freq: Once | INTRAVENOUS | Status: AC
  Administered 2019-06-18: 0.25 mg via INTRAVENOUS

## 2019-06-18 NOTE — Patient Instructions (Signed)
Coronavirus (COVID-19) Are you at risk?  Are you at risk for the Coronavirus (COVID-19)?  To be considered HIGH RISK for Coronavirus (COVID-19), you have to meet the following criteria:  . Traveled to [place], [place], [place], [place] or [place]; or in the [place] to [place], [place], [place], or [place]; and have fever, cough, and shortness of breath within the last 2 weeks of travel OR . Been in close contact with a person diagnosed with COVID-19 within the last 2 weeks and have fever, cough, and shortness of breath . IF YOU DO NOT MEET THESE CRITERIA, YOU ARE CONSIDERED LOW RISK FOR COVID-19.  What to do if you are HIGH RISK for COVID-19?  . If you are having a medical emergency, call 911. . Seek medical care right away. Before you go to a doctor's office, urgent care or emergency department, call ahead and tell them about your recent travel, contact with someone diagnosed with COVID-19, and your symptoms. You should receive instructions from your physician's office regarding next steps of care.  . When you arrive at healthcare provider, tell the healthcare staff immediately you have returned from visiting [place], [place], [place], [place] or [place]; or traveled in the [place] to [place], [place], [place], or [place]; in the last two weeks or you have been in close contact with a person diagnosed with COVID-19 in the last 2 weeks.   . Tell the health care staff about your symptoms: fever, cough and shortness of breath. . After you have been seen by a medical provider, you will be either: o Tested for (COVID-19) and discharged home on quarantine except to seek medical care if symptoms worsen, and asked to  - Stay home and avoid contact with others until you get your results (4-5 days)  - Avoid travel on public transportation if possible (such as bus, train, or airplane) or o Sent to the Emergency Department by EMS for evaluation, COVID-19 testing, and possible  admission depending on your condition and test results.  What to do if you are LOW RISK for COVID-19?  Reduce your risk of any infection by using the same precautions used for avoiding the common cold or flu:  . Wash your hands often with soap and warm water for at least 20 seconds.  If soap and water are not readily available, use an alcohol-based hand sanitizer with at least 60% alcohol.  . If coughing or sneezing, cover your mouth and nose by coughing or sneezing into the elbow areas of your shirt or coat, into a tissue or into your sleeve (not your hands). . Avoid shaking hands with others and consider head nods or verbal greetings only. . Avoid touching your eyes, nose, or mouth with unwashed hands.  . Avoid close contact with people who are sick. . Avoid places or events with large numbers of people in one location, like concerts or sporting events. . Carefully consider travel plans you have or are making. . If you are planning any travel outside or inside the [place], visit the CDC's Travelers' Health webpage for the latest health notices. . If you have some symptoms but not all symptoms, continue to monitor at home and seek medical attention if your symptoms worsen. . If you are having a medical emergency, call 911.   ADDITIONAL HEALTHCARE OPTIONS FOR PATIENTS  [facility] / e-Visit: [email or web address]         [facility]: [phone]  Onset   Urgent Care: [facility]: [place] Discharge Instructions for Patients Receiving Chemotherapy  Today you received the following chemotherapy agents: gemcitabine (Gemzar) and cisplatin (Platinol).  To help prevent nausea and vomiting after your treatment, we encourage you to take your nausea medication as prescribed by your physician.   If you develop nausea and vomiting that is not controlled  by your nausea medication, call the clinic.   BELOW ARE SYMPTOMS THAT SHOULD BE REPORTED IMMEDIATELY:  *FEVER GREATER THAN 100.5 F  *CHILLS WITH OR WITHOUT FEVER  NAUSEA AND VOMITING THAT IS NOT CONTROLLED WITH YOUR NAUSEA MEDICATION  *UNUSUAL SHORTNESS OF BREATH  *UNUSUAL BRUISING OR BLEEDING  TENDERNESS IN MOUTH AND THROAT WITH OR WITHOUT PRESENCE OF ULCERS  *URINARY PROBLEMS  *BOWEL PROBLEMS  UNUSUAL RASH Items with * indicate a potential emergency and should be followed up as soon as possible.  Feel free to call the clinic should you have any questions or concerns. The clinic phone number is [phone].  Please show the [place] at check-in to the Emergency Department and triage nurse.

## 2019-06-18 NOTE — Progress Notes (Signed)
Per Dr. Benay Spice: OK to treat today w/ANC 1.3--be sure she gets her Udenyca on 6/26

## 2019-06-18 NOTE — Patient Instructions (Signed)

## 2019-06-18 NOTE — Progress Notes (Signed)
[place] OFFICE PROGRESS NOTE   Diagnosis: Intrahepatic cholangiocarcinoma  INTERVAL HISTORY:   Laura [name] returns as scheduled.  She feels well.  She completed another cycle of gemcitabine/cisplatin on 05/28/2019.  No nausea, fever, rash, or neuropathy symptoms.  No abdominal pain. She did not receive Neulasta following this cycle.  Objective:  Vital signs in last 24 hours:  Blood pressure 137/61, pulse 77, temperature 98.2 F (36.8 C), temperature source Oral, resp. rate 18, height '5\' 5"'  (1.651 m), weight 133 lb 6.4 oz (60.5 kg), SpO2 98 %.    Limited physical examination secondary to distancing with the COVID pandemic GI: No hepatomegaly, no mass, mild tenderness in the right subcostal region Vascular: No leg edema  Skin: Mild erythema at the lower legs bilaterally (sun distribution, she reports sitting in the sun several days ago)  Portacath/PICC-without erythema  Lab Results:  Lab Results  Component Value Date   WBC 2.4 (L) 06/18/2019   HGB 11.4 (L) 06/18/2019   HCT 34.5 (L) 06/18/2019   MCV 94.3 06/18/2019   PLT 236 06/18/2019   NEUTROABS 1.3 (L) 06/18/2019    CMP  Lab Results  Component Value Date   NA 141 06/18/2019   K 4.0 06/18/2019   CL 106 06/18/2019   CO2 28 06/18/2019   GLUCOSE 84 06/18/2019   BUN 14 06/18/2019   CREATININE 0.75 06/18/2019   CALCIUM 9.4 06/18/2019   PROT 6.9 06/18/2019   ALBUMIN 3.9 06/18/2019   AST 25 06/18/2019   ALT 16 06/18/2019   ALKPHOS 138 (H) 06/18/2019   BILITOT 0.5 06/18/2019   GFRNONAA >60 06/18/2019   GFRAA >60 06/18/2019    Lab Results  Component Value Date   CEA1 <1.00 02/21/2019    Medications: I have reviewed the patient's current medications.   Assessment/Plan: 1. Right liver mass-biopsy 07/10/2016 confirmed adenocarcinoma, PDL 1 0%, MSS, tumor mutation burden 3, IDH1 and PIK3 CA alterations  CT abdomen/pelvis 06/17/2016 and MRI abdomen 06/21/2016 confirmed an isolated right liver  mass and a cystic pancreas lesion  PET scan 07/28/2016-the hepatic lesion is not hypermetabolic, hypermetabolic lesion in the gastric antrum suspicious for a primary neoplasm and no other evidence of metastatic disease  Upper endoscopy 08/03/2016-no mass found, biopsy from the gastric antrum-benign  CT-guided ablation of the right liver lesion 11/03/2016  MRI abdomen 03/08/2017-ablation defect in the right liver without residual/recurrent disease  MRI liver 06/11/2017-multiple new enhancing lesions in the right hepatic lobe surrounding the ablation defect consistent with progressive cholangiocarcinoma  Y-90 radioembolization of the right liver 07/19/2017  MRI abdomen 10/09/2017-suspected progression of intrahepatic larger carcinoma within segment 6, reviewed by interventional radiology most consistent with pseudo-progression  MRI abdomen 01/08/2018-slight decrease in the segment 6 mass, no change in enhancement at the ablation bed, new tiny arterial phase area of hyperenhancement in the lateral left liver-potentially a vascular phenomena  MRI abdomen 04/03/2018-stable segment 6 mass, no change in 10 mm flash filling segment 3 lesion-potentially a vascular shunt, a porta hepatis node measures 2.2 cm compared to 1.5 cm, no new lymphadenopathy  MRI abdomen 07/03/2018 stable segment 6 liver mass, stable segment 3 arterial phase enhancing lesion stable porta hepatis adenopathy, stable pancreas cystic lesion  MRI abdomen 11/20/2018- new 2.1 cm lateral left liver lesion, new 8 mm medial left liver lesion, no change in posterior right hepatic mass, no change in necrotic hepato-duodenal ligament lymph node no change in cystic pancreas lesion  Y 90 of the left liver 01/16/2019  Cycle 1  gemcitabine/cisplatin 03/12/2019  Cycle 2 gemcitabine/cisplatin 04/09/2019 (doses reduced due to neutropenia)  Cycle 3 gemcitabine/cisplatin 05/07/2019  Cycle 4 gemcitabine/cisplatin 05/28/2019-Neulasta not given   Cycle 5 gemcitabine/cisplatin 06/18/2019 2. Left leg and right foot pain  Negative left leg Doppler 07/14/2016   3. Right abdominal pain- potentially related to the dominant right liver mass  4. Anorexia/weight loss  5. Benign appearing 16 mm cystic uncinatepancreas lesion noted on MRI 06/20/2016   Slightly larger on an MRI 03/08/2017, felt to most likely represent a side branch intraductal papillary mucinous neoplasm  Stable on MRI 10/09/2017  Stable on MRI 01/08/2018  Stable on MRI 04/03/2018  Stable on MRI 07/03/2018  Stable on MRI 11/20/2018  6. Anemia-iron deficiency  7.  Port-A-Cath placement interventional radiology 03/04/2019  8.  Neutropenia secondary to chemotherapy- gemcitabine and cisplatin dose reduced beginning with cycle 2; mild neutropenia and thrombocytopenia 04/22/2019.  Gemcitabine and cisplatin held.  Treatment changed to every 3 weeks beginning on 05/14/2019.     Disposition: Laura [name] appears well.  She will complete another cycle of gemcitabine/cisplatin today.  She has mild neutropenia.  She will receive Neulasta with this cycle.  She will call for a fever or symptoms of an infection.  Ms. Meding will return for an office visit and chemotherapy in 3 weeks.  She will undergo a restaging MRI prior to the next office visit.  [name], MD  06/18/2019  9:23 AM

## 2019-06-19 ENCOUNTER — Telehealth: Payer: Self-pay | Admitting: Oncology

## 2019-06-19 NOTE — Telephone Encounter (Signed)
Called and spoke with patient. Confirmed appts  °

## 2019-06-20 ENCOUNTER — Inpatient Hospital Stay: Payer: PPO

## 2019-06-20 ENCOUNTER — Other Ambulatory Visit: Payer: Self-pay

## 2019-06-20 VITALS — BP 123/51 | HR 66 | Temp 99.4°F | Resp 18

## 2019-06-20 DIAGNOSIS — C221 Intrahepatic bile duct carcinoma: Secondary | ICD-10-CM

## 2019-06-20 DIAGNOSIS — Z5111 Encounter for antineoplastic chemotherapy: Secondary | ICD-10-CM | POA: Diagnosis not present

## 2019-06-20 MED ORDER — PEGFILGRASTIM-CBQV 6 MG/0.6ML ~~LOC~~ SOSY
6.0000 mg | PREFILLED_SYRINGE | Freq: Once | SUBCUTANEOUS | Status: AC
  Administered 2019-06-20: 6 mg via SUBCUTANEOUS

## 2019-06-20 MED ORDER — PEGFILGRASTIM-CBQV 6 MG/0.6ML ~~LOC~~ SOSY
PREFILLED_SYRINGE | SUBCUTANEOUS | Status: AC
  Filled 2019-06-20: qty 0.6

## 2019-06-25 ENCOUNTER — Other Ambulatory Visit: Payer: Self-pay | Admitting: Interventional Radiology

## 2019-06-25 DIAGNOSIS — C221 Intrahepatic bile duct carcinoma: Secondary | ICD-10-CM

## 2019-07-02 ENCOUNTER — Telehealth: Payer: Self-pay | Admitting: *Deleted

## 2019-07-02 DIAGNOSIS — C221 Intrahepatic bile duct carcinoma: Secondary | ICD-10-CM

## 2019-07-02 NOTE — Telephone Encounter (Signed)
Asking why she is scheduled for cycle #6 on 07/07/19? She thought MD was only giving her #5 cycles of chemo> Has MRI scheduled on 7/13 at 0730. Per Dr. Benay Spice: Plan was for 5 cycles and then rescan w/MRI. If scan is good, will continue treatments.  Reports that the Ensure bid is getting her blood sugars higher at 125-159. She purchased Glucerna and will start this bid. Instructed her to monitor blood sugars w/Glucerna and report at next visit. Can inquire of dietician what she should do. She also reports one episode on Sunday of burning w/urination and small amount of blood when she wiped. Asking for a UA when she is here on 7/14.

## 2019-07-06 ENCOUNTER — Other Ambulatory Visit: Payer: Self-pay | Admitting: Oncology

## 2019-07-07 ENCOUNTER — Ambulatory Visit (HOSPITAL_COMMUNITY): Admission: RE | Admit: 2019-07-07 | Payer: PPO | Source: Ambulatory Visit

## 2019-07-08 ENCOUNTER — Encounter: Payer: Self-pay | Admitting: Nurse Practitioner

## 2019-07-08 ENCOUNTER — Telehealth: Payer: Self-pay

## 2019-07-08 ENCOUNTER — Inpatient Hospital Stay: Payer: PPO | Attending: Oncology | Admitting: Nurse Practitioner

## 2019-07-08 ENCOUNTER — Inpatient Hospital Stay: Payer: PPO

## 2019-07-08 ENCOUNTER — Other Ambulatory Visit: Payer: Self-pay

## 2019-07-08 ENCOUNTER — Ambulatory Visit (HOSPITAL_COMMUNITY)
Admission: RE | Admit: 2019-07-08 | Discharge: 2019-07-08 | Disposition: A | Discharge status: Home / Self Care | Payer: PPO | Source: Ambulatory Visit | Attending: Oncology | Admitting: Oncology

## 2019-07-08 VITALS — BP 139/51 | HR 72 | Temp 99.1°F | Resp 18 | Ht 65.0 in | Wt 132.8 lb

## 2019-07-08 DIAGNOSIS — Z9221 Personal history of antineoplastic chemotherapy: Secondary | ICD-10-CM | POA: Diagnosis not present

## 2019-07-08 DIAGNOSIS — Z452 Encounter for adjustment and management of vascular access device: Secondary | ICD-10-CM | POA: Diagnosis not present

## 2019-07-08 DIAGNOSIS — K862 Cyst of pancreas: Secondary | ICD-10-CM

## 2019-07-08 DIAGNOSIS — Z95828 Presence of other vascular implants and grafts: Secondary | ICD-10-CM

## 2019-07-08 DIAGNOSIS — C221 Intrahepatic bile duct carcinoma: Secondary | ICD-10-CM | POA: Diagnosis not present

## 2019-07-08 LAB — CMP (CANCER CENTER ONLY)
ALT: 14 U/L (ref 0–44)
AST: 20 U/L (ref 15–41)
Albumin: 4 g/dL (ref 3.5–5.0)
Alkaline Phosphatase: 141 U/L — ABNORMAL HIGH (ref 38–126)
Anion gap: 7 (ref 5–15)
BUN: 15 mg/dL (ref 8–23)
CO2: 28 mmol/L (ref 22–32)
Calcium: 9.5 mg/dL (ref 8.9–10.3)
Chloride: 105 mmol/L (ref 98–111)
Creatinine: 0.62 mg/dL (ref 0.44–1.00)
GFR, Est AFR Am: >60 mL/min (ref >60)
GFR, Estimated: >60 mL/min (ref >60)
Glucose, Bld: 85 mg/dL (ref 70–99)
Potassium: 4 mmol/L (ref 3.5–5.1)
Sodium: 140 mmol/L (ref 135–145)
Total Bilirubin: 0.3 mg/dL (ref 0.3–1.2)
Total Protein: 6.9 g/dL (ref 6.5–8.1)

## 2019-07-08 LAB — CBC WITH DIFFERENTIAL (CANCER CENTER ONLY)
Abs Immature Granulocytes: 0.03 10*3/uL (ref 0.00–0.07)
Basophils Absolute: 0 10*3/uL (ref 0.0–0.1)
Basophils Relative: 1 %
Eosinophils Absolute: 0 10*3/uL (ref 0.0–0.5)
Eosinophils Relative: 0 %
HCT: 35.9 % — ABNORMAL LOW (ref 36.0–46.0)
Hemoglobin: 12 g/dL (ref 12.0–15.0)
Immature Granulocytes: 1 %
Lymphocytes Relative: 14 %
Lymphs Abs: 0.8 10*3/uL (ref 0.7–4.0)
MCH: 32.3 pg (ref 26.0–34.0)
MCHC: 33.4 g/dL (ref 30.0–36.0)
MCV: 96.5 fL (ref 80.0–100.0)
Monocytes Absolute: 0.6 10*3/uL (ref 0.1–1.0)
Monocytes Relative: 12 %
Neutro Abs: 3.9 10*3/uL (ref 1.7–7.7)
Neutrophils Relative %: 72 %
Platelet Count: 322 10*3/uL (ref 150–400)
RBC: 3.72 MIL/uL — ABNORMAL LOW (ref 3.87–5.11)
RDW: 14.2 % (ref 11.5–15.5)
WBC Count: 5.4 10*3/uL (ref 4.0–10.5)
nRBC: 0 % (ref 0.0–0.2)

## 2019-07-08 LAB — MAGNESIUM: Magnesium: 2.1 mg/dL (ref 1.7–2.4)

## 2019-07-08 LAB — URINALYSIS, COMPLETE (UACMP) WITH MICROSCOPIC
Bilirubin Urine: NEGATIVE
Glucose, UA: NEGATIVE mg/dL
Hgb urine dipstick: NEGATIVE
Ketones, ur: NEGATIVE mg/dL
Leukocytes,Ua: NEGATIVE
Nitrite: NEGATIVE
Protein, ur: NEGATIVE mg/dL
Specific Gravity, Urine: 1.01 (ref 1.005–1.030)
pH: 6 (ref 5.0–8.0)

## 2019-07-08 MED ORDER — SODIUM CHLORIDE 0.9% FLUSH
10.0000 mL | Freq: Once | INTRAVENOUS | Status: AC
  Administered 2019-07-08: 10 mL
  Filled 2019-07-08: qty 10

## 2019-07-08 MED ORDER — HEPARIN SOD (PORK) LOCK FLUSH 100 UNIT/ML IV SOLN
500.0000 [IU] | Freq: Once | INTRAVENOUS | Status: AC
  Administered 2019-07-08: 11:00:00 500 [IU]
  Filled 2019-07-08: qty 5

## 2019-07-08 MED ORDER — GADOBUTROL 1 MMOL/ML IV SOLN
5.0000 mL | Freq: Once | INTRAVENOUS | Status: AC | PRN
  Administered 2019-07-08: 5 mL via INTRAVENOUS

## 2019-07-08 NOTE — Telephone Encounter (Signed)
Called lab per lisa to get them to add ua to patients urine.

## 2019-07-08 NOTE — Progress Notes (Signed)
Per Lattie Haw faxed in prescription for patient to get Rolator walker with seat for ambulation from [facility] [phone], fax# [phone]. also faxed over patient insurance information. Patient made aware. Copy of fax on desk.

## 2019-07-08 NOTE — Progress Notes (Addendum)
[place] OFFICE PROGRESS NOTE   Diagnosis: Intrahepatic cholangiocarcinoma  INTERVAL HISTORY:   Laura [name] returns as scheduled.  She completed cycle 5 gemcitabine/cisplatin 06/18/2019.  She denies nausea/vomiting.  No mouth sores.  No diarrhea.  No numbness or tingling in the hands or feet.  No fever or rash following treatment.  Objective:  Vital signs in last 24 hours:  Blood pressure (!) 139/51, pulse 72, temperature 99.1 F (37.3 C), temperature source Oral, resp. rate 18, height '5\' 5"'  (1.651 m), weight 132 lb 12.8 oz (60.2 kg), SpO2 100 %.    HEENT: No thrush or ulcers. GI: Abdomen soft and nontender.  No hepatomegaly. Vascular: No leg edema. Port-A-Cath without erythema.  Lab Results:  Lab Results  Component Value Date   WBC 5.4 07/08/2019   HGB 12.0 07/08/2019   HCT 35.9 (L) 07/08/2019   MCV 96.5 07/08/2019   PLT 322 07/08/2019   NEUTROABS 3.9 07/08/2019    Imaging:  Mr Abdomen W Wo Contrast  Result Date: 07/08/2019 CLINICAL DATA:  Restaging cholangiocarcinoma, prior Y-90 EXAM: MRI ABDOMEN WITHOUT AND WITH CONTRAST TECHNIQUE: Multiplanar multisequence MR imaging of the abdomen was performed both before and after the administration of intravenous contrast. CONTRAST:  5 mL Gadovist IV COMPARISON:  MRI abdomen dated 11/20/2018 FINDINGS: Lower chest: Lung bases are clear. Hepatobiliary: Dominant 3.0 x 5.2 cm lesion in segment 6 (series 1002/image 62), previously 3.1 x 3.3 cm. Associated 2.1 cm enhancing focus along the left anteromedial margin of the lesion, previously 1.6 cm. Additional 1.8 x 3.0 cm enhancing lesion superior to the dominant lesion, previously 1.7 cm. Two enhancing lesions in segment 4, measuring up to 1.6 cm (series 1002/images 39 and 43). These were not conspicuous on the prior. 1.6 cm enhancing lesion in segment 2, previously 2.1 cm. Multiple additional scattered enhancing lesions predominantly in the subcapsular liver (series 1001),  new. Status post cholecystectomy. No intrahepatic or extrahepatic ductal dilatation. Pancreas: 2.6 cm complex cystic lesion in the pancreatic head (series 5/image 39), previously 2.1 cm. No pancreatic atrophy or ductal dilatation. Spleen:  Within normal limits. Adrenals/Urinary Tract:  Adrenal glands are within normal limits. Bilateral renal sinus cysts. 8 mm left upper pole cortical cyst. No hydronephrosis. Stomach/Bowel: Stomach is within normal limits. Visualized bowel is unremarkable. Vascular/Lymphatic:  No evidence of abdominal aortic aneurysm. 2.6 cm short axis node in the porta hepatis (series 5/image 25), previously 2.1 cm. Other:  No abdominal ascites. Musculoskeletal: No focal osseous lesions. IMPRESSION: Progressive enhancing hepatic metastases, as above. Dominant 5.2 cm lesion in segment 6, as above. Progressive nodal metastasis in the porta hepatis. Additional ancillary findings as above. Electronically Signed   By: Julian Hy M.D.   On: 07/08/2019 09:48    Medications: I have reviewed the patient's current medications.  Assessment/Plan: 1. Right liver mass-biopsy 07/10/2016 confirmed adenocarcinoma, PDL 1 0%, MSS, tumor mutation burden 3, IDH1 and PIK3 CA alterations  CT abdomen/pelvis 06/17/2016 and MRI abdomen 06/21/2016 confirmed an isolated right liver mass and a cystic pancreas lesion  PET scan 07/28/2016-the hepatic lesion is not hypermetabolic, hypermetabolic lesion in the gastric antrum suspicious for a primary neoplasm and no other evidence of metastatic disease  Upper endoscopy 08/03/2016-no mass found, biopsy from the gastric antrum-benign  CT-guided ablation of the right liver lesion 11/03/2016  MRI abdomen 03/08/2017-ablation defect in the right liver without residual/recurrent disease  MRI liver 06/11/2017-multiple new enhancing lesions in the right hepatic lobe surrounding the ablation defect consistent with progressive cholangiocarcinoma  Y-90  radioembolization of the right liver 07/19/2017  MRI abdomen 10/09/2017-suspected progression of intrahepatic larger carcinoma within segment 6, reviewed by interventional radiology most consistent with pseudo-progression  MRI abdomen 01/08/2018-slight decrease in the segment 6 mass, no change in enhancement at the ablation bed, new tiny arterial phase area of hyperenhancement in the lateral left liver-potentially a vascular phenomena  MRI abdomen 04/03/2018-stable segment 6 mass, no change in 10 mm flash filling segment 3 lesion-potentially a vascular shunt, a porta hepatis node measures 2.2 cm compared to 1.5 cm, no new lymphadenopathy  MRI abdomen 07/03/2018 stable segment 6 liver mass, stable segment 3 arterial phase enhancing lesion stable porta hepatis adenopathy, stable pancreas cystic lesion  MRI abdomen 11/20/2018- new 2.1 cm lateral left liver lesion, new 8 mm medial left liver lesion, no change in posterior right hepatic mass, no change in necrotic hepato-duodenal ligament lymph node no change in cystic pancreas lesion  Y 90 of the left liver 01/16/2019  Cycle 1 gemcitabine/cisplatin 03/12/2019  Cycle 2 gemcitabine/cisplatin 04/09/2019 (doses reduced due to neutropenia)  Cycle 3 gemcitabine/cisplatin 05/07/2019  Cycle 4 gemcitabine/cisplatin 05/28/2019-Neulasta not given  Cycle 5 gemcitabine/cisplatin 06/18/2019  MRI abdomen 07/08/2019- progressive liver metastases 2. Left leg and right foot pain  Negative left leg Doppler 07/14/2016   3. Right abdominal pain- potentially related to the dominant right liver mass  4. Anorexia/weight loss  5. Benign appearing 16 mm cystic uncinatepancreas lesion noted on MRI 06/20/2016   Slightly larger on an MRI 03/08/2017, felt to most likely represent a side branch intraductal papillary mucinous neoplasm  Stable on MRI 10/09/2017  Stable on MRI 01/08/2018  Stable on MRI 04/03/2018  Stable on MRI 07/03/2018  Stable on MRI  11/20/2018  6. Anemia-iron deficiency  7. Port-A-Cath placement interventional radiology 03/04/2019  8.Neutropenia secondary to chemotherapy- gemcitabineand cisplatindose reducedbeginning with cycle 2; mild neutropenia and thrombocytopenia 04/22/2019.  Gemcitabine and cisplatin held.  Treatment changed to every 3 weeks beginning on 05/14/2019.  Disposition: Laura [name] appears stable.  She has completed 5 cycles of gemcitabine/cisplatin.  Restaging MRI shows evidence of progression.  We reviewed the results with her at today's visit.  Dr. Benay Spice recommends discontinuation of further gemcitabine/cisplatin.  We will discuss options for treatment with the [place] pharmacist based on the IDH 1 mutation.  She will return for a follow-up visit in 4 weeks.  She will contact the office in the interim with any problems.  Patient seen with Dr. Benay Spice.  25 minutes were spent face-to-face at today's visit with the majority of that time involved in counseling/coordination of care.    Ned Card ANP/GNP-BC   07/08/2019  12:08 PM  This was a shared visit with Ned Card.  We reviewed the MRI findings with Laura [name].  There is disease progression in the liver.  There was a significant gap between the baseline MRI and initiation of systemic therapy.  Despite this off treatment interval, I feel she has not experienced a significant benefit from the gemcitabine/cisplatin.  Treatment has been complicated by neutropenia requiring growth factor support and treatment delay.  I recommend discontinuing gemcitabine/cisplatin.  We discussed treatment options.  Second line salvage chemotherapy with single agent Xeloda, FOLFOX, or FOLFIRI can be considered, but the expected response rate is low.  Her tumor has an IDH 1 mutation.  There is data suggesting a progression free survival benefit with ivosidenib.  We will check into drug availability and cost for this agent.  We asked her to have her  daughter contact us if she has questions.  Julieanne Manson, MD

## 2019-07-08 NOTE — Progress Notes (Signed)
Met with patient in treatment room to discuss concerns regarding bills.  Asked patient if she was familiar with where she was as far as ded/OOP for insurance. She states she is not but called and left a message and was waiting for them to return her call. Advised  Patient they would be able to tell her the specifics of her plan and where she stands and also send an EOB which will show her how much more she has to meet before they cover at 100%. She verbalized understanding.  Advised her of balance of her one-time $80 [place] and how to submit remaining personal bills. She verbalized understanding.  She asked about assistance through the [employer]. Referred her to contact them due to them not following up with how they help our patients. She verbalized understanding.  She has my card for any additional financial questions or concerns.  Called [employer]([place]) [id number] to obtain benefit details. She states there is no deductible and the max OOP is $3400 and so far patient has met $1084.14. Patient still has amounts pending in insurance therefore not able to provide accuracy of whether or not these amounts have been met until insurance has paid pending claims.

## 2019-07-09 ENCOUNTER — Telehealth: Payer: Self-pay

## 2019-07-09 ENCOUNTER — Telehealth: Payer: Self-pay | Admitting: Oncology

## 2019-07-09 ENCOUNTER — Telehealth: Payer: Self-pay | Admitting: Pharmacist

## 2019-07-09 ENCOUNTER — Inpatient Hospital Stay: Payer: PPO

## 2019-07-09 DIAGNOSIS — C221 Intrahepatic bile duct carcinoma: Secondary | ICD-10-CM

## 2019-07-09 MED ORDER — TIBSOVO 250 MG PO TABS: 500.0000 mg | ORAL_TABLET | Freq: Every day | ORAL | 0 refills | Status: DC

## 2019-07-09 NOTE — Telephone Encounter (Signed)
Oral Oncology Patient Advocate Encounter  Prior Authorization for Tibsovo has been approved.    PA# [id number] Effective dates: 07/09/19 through 07/08/20  Oral Oncology Clinic will continue to follow.   [place] Patient [place] Phone [phone] Fax [phone] 07/09/2019    2:16 PM

## 2019-07-09 NOTE — Telephone Encounter (Signed)
Oral Oncology Pharmacist Encounter  Received new prescription for Tibsovo (ivosidenib) for the treatment of metastatic intrahepatic cholangiocarcinoma, previously treated, IDH1 mutation, planned duration until disease progression or unacceptable toxicity.  Original diagnosis in 2017, ablation of lesion was performed at that time Progression and increase in lesions noted in June 2018, patient received Y90 radioembolization 07/19/2017 MRI in late 2019 again showed progression and new lesions, patient received  Y90 radioembolization 01/16/2019 Patient was then treated with ciplatin and gemcitabine x 5 cycles March-June 2020 MRI performed 07/08/2019 again shows progression  Patient is under evaluation to initiate treatment with ivosidenib at 560m once daily due to [facility]ation and positive phase 3 data noted in ClarIDHy trial published in Lancet Oncology in May 2020  Labs from 07/08/2019 assessed, OK for treatment initiation. 08/24/2018 EKG shows QTc at 435 msec  Current medication list in Epic reviewed, moderate DDIs with ivosidenib identified:  ivosidenib and doxazosin and amlodipine, category C interactions, ivosidenib is a major CYP3A4 substrate leading to possible decreased metabolism and increased systemic exposure to the doxazosin and amlodipine. HRs and BPs in Epic assessed, OK for concurrent administration, will continue to be monitored on treatment.  Prescription will be e-scribed to appropriate pharmacy for dispensing once insurance authorization is approved. Tibsovo is not available for dispensing from the [employer]  Oral Oncology Clinic will continue to follow for insurance authorization, copayment issues, initial counseling and start date.  JJohny Drilling PharmD, BCPS, BCOP  07/09/2019 12:03 PM Oral Oncology Clinic [phone]

## 2019-07-09 NOTE — Telephone Encounter (Signed)
Called and spoke with patient. Confirmed upcoming appt

## 2019-07-09 NOTE — Telephone Encounter (Signed)
Oral Oncology Patient Advocate Encounter  Received notification from [facility] that prior authorization for Tibsovo is required.  PA submitted on CoverMyMeds Key [id number] Status is pending  Oral Oncology Clinic will continue to follow.  [place] Patient [place] Phone [phone] Fax [phone] 07/09/2019    1:17 PM

## 2019-07-09 NOTE — Telephone Encounter (Signed)
Oral Oncology Patient Advocate Encounter  I called [facility] to make sure they have everything they need from [place] and they do.  They are working on getting the prescription processed now and will call us with a copay amount.   [place] Patient [place] Phone [phone] Fax [phone] 07/09/2019    3:53 PM

## 2019-07-09 NOTE — Telephone Encounter (Signed)
Called Laura [name] as she was not here for her 800 infusion.  She said that she was supposed to be cancelled because Dr. Benay Spice told her that her chemo was not working anymore and they would need to try other options.  I apologized that she did not get cancelled. Copying in provider and NP to make them aware. Gardiner Rhyme

## 2019-07-09 NOTE — Telephone Encounter (Signed)
Oral Oncology Pharmacist Encounter  Insurance authorization for Laura [name] has been approved. Prescription has been e-scribed to [facility] pharmacy as Laura [name] is not available for dispensing from the [facility]. Supporting information including demographics, insurance card, and medication list will be faxed to dispensing pharmacy.  We will plan to reach out to patient to provide update on medication acquisition and perform initial counseling once we confirm [facility] is able to process patient's prescription.  Johny Drilling, PharmD, BCPS, BCOP  07/09/2019 2:30 PM Oral Oncology Clinic [phone]

## 2019-07-10 ENCOUNTER — Telehealth: Payer: Self-pay

## 2019-07-10 NOTE — Telephone Encounter (Signed)
Oral Oncology Patient Advocate Encounter  Met patient in the lobby to complete application for [employer] in an effort to reduce patient's out of pocket expense for Tibsovo to $0.    Application completed and faxed to [phone].   [employer] patient assistance phone number for follow up is [phone].   This encounter will be updated until final determination.     [facility] Patient Advocate [facility] Phone [phone] Fax [phone] 07/10/2019    4:00 PM   

## 2019-07-11 ENCOUNTER — Ambulatory Visit: Payer: PPO

## 2019-07-11 NOTE — Telephone Encounter (Signed)
Oral Oncology Patient Advocate Encounter  [employer] called and stated that the patient had to apply for LIS and be denied before they would consider her for assistance.   The income amount on the application was slightly lower than the actual annual income based on her SSI award letter. The SSI award letter amount will put her over the income limit for LLS. I updated the application and faxed it to [facility] with her SSI award letter attached. I also called [employer] and made them aware of this.  This encounter will be updated until final determination.   [place] Patient [place] Phone [phone] Fax [phone] 07/11/2019   12:27 PM

## 2019-07-11 NOTE — Telephone Encounter (Signed)
Oral Oncology Patient Advocate Encounter  [employer] called this morning to request one of the pages with the providers signature, I faxed that again this morning.  They also requested the prior authorization approval letter and I faxed that this morning as well.  This encounter will be updated until final determination.  [name] Phone [phone] Fax [phone] 07/11/2019   8:40 AM

## 2019-07-14 NOTE — Telephone Encounter (Signed)
Oral Oncology Patient Advocate Encounter  I called [employer] to follow up on the status of the Tibsovo.   I spoke to [place] and she stated that the application is in the final stages of processing and should send a decision in 24-48 hours.  This encounter will be updated until final determination.  [place] Patient [place] Phone [phone] Fax [phone] 07/14/2019   2:10 PM

## 2019-07-16 ENCOUNTER — Telehealth: Payer: PPO

## 2019-07-16 NOTE — Telephone Encounter (Signed)
Oral Oncology Patient Advocate Encounter  I called [employer] to follow up on the Tibsovo application and it is still in processing.  This encounter will be updated until final determination.  [place] Patient [place] Phone [phone] Fax [phone] 07/16/2019   10:20 AM

## 2019-07-16 NOTE — Telephone Encounter (Signed)
Oral Oncology Patient Advocate Encounter  Hinton Dyer from [facility] called and stated that the patient would still need to apply for LIS since her income was close to the income requirement to qualify for LIS.   Hinton Dyer stated that I could send an appeal email to [email or web address] and I sent that today.  I called the patient and explained this, I told her that I could help her apply online but I would need to ask a lot of financial questions. The patient is currently out to eat with her family and will give me a call back later today.  I will continue to update.  [place] Patient [place] Phone [phone] Fax [phone] 07/16/2019   2:16 PM

## 2019-07-16 NOTE — Telephone Encounter (Signed)
Oral Oncology Patient Advocate Encounter  The patient called me back. I submitted the LIS application online with the patient, she was denied and a denial letter will be mailed to her home. Once she receives the denial letter she will call me and we will schedule a time for her to bring it in to me, I will then fax it to [employer].  I called [employer] and Hinton Dyer confirmed that they did receive my email and it will be forwarded to the financial team. I also informed Hinton Dyer that the LIS application was denied and I would send it to them as soon as the patient received the letter in the mail.  Hinton Dyer stated she would make a note of that for the financial team to see as well.  This encounter will be updated until final determination.   [place] Patient [place] Phone [phone] Fax [phone] 07/16/2019   3:26 PM

## 2019-07-16 NOTE — Telephone Encounter (Signed)
thanks

## 2019-07-17 NOTE — Telephone Encounter (Signed)
Oral Oncology Patient Advocate Encounter  Received notification from [facility] Patient Assistance program that patient has been successfully enrolled into their program to receive Tibsovo from the manufacturer at $0 out of pocket until 12/25/19.    I called and spoke with patient.  She knows we will have to re-apply.   The contracted pharmacy is [facility]. The patient knows to expect a call from them to schedule the first shipment, they will not ship it to her until they speak to her. I gave her [facility] phone number to call 10 days before her refill is due to schedule that refill. [facility] # [phone].  Patient knows to call the office with questions or concerns.   Oral Oncology Clinic will continue to follow.  [place] Patient [place] Phone [phone] Fax [phone] 07/17/2019    9:38 AM

## 2019-07-17 NOTE — Telephone Encounter (Signed)
Oral Chemotherapy Pharmacist Encounter   I spoke with patient for overview of new oral chemotherapy medication: Tibsovo (ivosidenib) for the treatment of metastatic intrahepatic cholangiocarcinoma, previously treated, IDH1 mutation, planned duration until disease progression or unacceptable toxicity.   Counseled patient on administration, dosing, side effects, monitoring, drug-food interactions, safe handling, storage, and disposal.  Patient will take Tibsovo 250 mg tablets, 2 tablets (500 mg) by mouth once daily, with or without food, at approximately the same time each day. Patient informed that Tibsovo should not be taken with a high fat meal due to risk of very increased absorption.  Patient has a fairly high fat diet.  She states she eats bacon and eggs for breakfast most mornings. She has some form of fast food or a hot dog for lunch and dinner. She does eat at least 1 serving of fruit each day, but does not have at least 1 serving of vegetables daily. We discussed increasing vegetable intake.  Patient knows to avoid grapefruit and grapefruit juice while on therapy with Tibsovo.  Tibsovo start date: TBD, pending medication acquisition Patient will call the office when she starts on the medication so appropriate follow up can be scheduled.    For cholangiocarcinoma: Side effects include but not limited to: GI upset (nausea, diarrhea, abdominal pain, decreased appetite, vomiting, constipation), fatigue, cough, lower extremity edema and headache.   Patient has anti-emetic on hand and knows to take it if nausea develops.   We extensively discussed antiemetic schedule for nausea prevention and treatment as patient stated she is managing increased nausea recently even off treatment. Patient will obtain anti diarrheal and alert the office of 4 or more loose stools above baseline.  Reviewed with patient importance of keeping a medication schedule and plan for any missed doses.  Medication  reconciliation performed and medication/allergy list updated.  Insurance authorization for Tibsovo has been obtained. Test claim at the pharmacy revealed copayment ~$3400 for 1st fill of Tibsovo. This is not affordable to patient.  Oral oncology patient advocate was successful in enrolling patient for manufacturer assistance program with [employer] to obtain Tibsovo at $0 out of pocket cost to patient until 12/25/2019. Patient will call 10 days prior to needing her next fill of Tibsovo to order the next month's supply.  Patient asked about visitor restriction policy. I instructed patient tha visitors are still restricted from attending appointments at this time, however, she can call one of her daughters or her sister during next office visit so they can hear details discussed.  Patient informed her next scheduled office vsiit is 8/12, however, this may change depending on when she actually starts Tibsovo.  All questions answered.  [name] voiced understanding and appreciation.   Patient knows to call the office with questions or concerns.  Johny Drilling, PharmD, BCPS, BCOP  07/17/2019  9:25 AM Oral Oncology Clinic [phone]

## 2019-07-17 NOTE — Telephone Encounter (Signed)
Did you see potential drug interactions sent to [place] by company?

## 2019-07-18 NOTE — Telephone Encounter (Signed)
Oral Oncology Pharmacist Encounter  Received call from patient with information that her Tibsovo had arrive early this afternoon. She will start tomorrow morning, 07/19/2019.  She will alert the office with any issues. Next office visit planned for 08/06/19, no lab appointment is scheduled. Patient notified that appointments may be moved up now that we know her Tibsovo start date.  Johny Drilling, PharmD, BCPS, BCOP  07/18/2019 1:55 PM Oral Oncology Clinic [phone]

## 2019-07-18 NOTE — Telephone Encounter (Signed)
I had found this previously:  ivosidenib and doxazosin and amlodipine, category C interactions, ivosidenib is a major CYP3A4 substrate leading to possible decreased metabolism and increased systemic exposure to the doxazosin and amlodipine. HRs and BPs in Epic assessed, OK for concurrent administration, will continue to be monitored on treatment.  As far as the ondansetron interaction, this is due to the possibility of QTc prolongation with both agents. Patient's current ondansetron dose is 4mg  every 6 hours as needed. She has not been taking any.  Her use may increase with Tibsovo initiation since there is ~30% risk nausea. This dose and frequency of ondansetron will not have significant effect on cardiac conduction, so this interaction is minimized.  We did talk through how she can alternate the ondansetron 4mg  and prochlorperazine 5mg  for increased nausea control as she expressed complaints of increased nausea the past few days.  Let me know if you all need anything else.  Johny Drilling, PharmD, BCPS, BCOP  07/18/2019  8:17 AM  Oral Oncology Clinic [phone]

## 2019-07-18 NOTE — Telephone Encounter (Signed)
Schedule CBC, CPK, and CMP with a EKG for 07/25/2019, 07/31/2019  Office visit with Malachy Mood or Lattie Haw to 07/31/2019

## 2019-07-21 ENCOUNTER — Other Ambulatory Visit: Payer: Self-pay | Admitting: *Deleted

## 2019-07-21 DIAGNOSIS — C221 Intrahepatic bile duct carcinoma: Secondary | ICD-10-CM

## 2019-07-21 NOTE — Progress Notes (Signed)
Staff message from MD requesting labs/EKG on 7/31 and 8/6 with OV on 8/6. Orders entered and scheduling message sent

## 2019-07-25 ENCOUNTER — Encounter: Payer: Self-pay | Admitting: Oncology

## 2019-07-25 ENCOUNTER — Inpatient Hospital Stay: Payer: PPO

## 2019-07-25 ENCOUNTER — Other Ambulatory Visit: Payer: Self-pay

## 2019-07-25 DIAGNOSIS — C221 Intrahepatic bile duct carcinoma: Secondary | ICD-10-CM

## 2019-07-25 LAB — CMP (CANCER CENTER ONLY)
ALT: 12 U/L (ref 0–44)
AST: 20 U/L (ref 15–41)
Albumin: 3.9 g/dL (ref 3.5–5.0)
Alkaline Phosphatase: 131 U/L — ABNORMAL HIGH (ref 38–126)
Anion gap: 8 (ref 5–15)
BUN: 11 mg/dL (ref 8–23)
CO2: 27 mmol/L (ref 22–32)
Calcium: 9.9 mg/dL (ref 8.9–10.3)
Chloride: 106 mmol/L (ref 98–111)
Creatinine: 0.72 mg/dL (ref 0.44–1.00)
GFR, Est AFR Am: >60 mL/min (ref >60)
GFR, Estimated: >60 mL/min (ref >60)
Glucose, Bld: 94 mg/dL (ref 70–99)
Potassium: 3.9 mmol/L (ref 3.5–5.1)
Sodium: 141 mmol/L (ref 135–145)
Total Bilirubin: 0.5 mg/dL (ref 0.3–1.2)
Total Protein: 6.8 g/dL (ref 6.5–8.1)

## 2019-07-25 LAB — CBC WITH DIFFERENTIAL (CANCER CENTER ONLY)
Abs Immature Granulocytes: 0.01 10*3/uL (ref 0.00–0.07)
Basophils Absolute: 0.1 10*3/uL (ref 0.0–0.1)
Basophils Relative: 2 %
Eosinophils Absolute: 0 10*3/uL (ref 0.0–0.5)
Eosinophils Relative: 1 %
HCT: 37 % (ref 36.0–46.0)
Hemoglobin: 12.4 g/dL (ref 12.0–15.0)
Immature Granulocytes: 0 %
Lymphocytes Relative: 23 %
Lymphs Abs: 0.7 10*3/uL (ref 0.7–4.0)
MCH: 32.4 pg (ref 26.0–34.0)
MCHC: 33.5 g/dL (ref 30.0–36.0)
MCV: 96.6 fL (ref 80.0–100.0)
Monocytes Absolute: 0.5 10*3/uL (ref 0.1–1.0)
Monocytes Relative: 15 %
Neutro Abs: 1.8 10*3/uL (ref 1.7–7.7)
Neutrophils Relative %: 59 %
Platelet Count: 193 10*3/uL (ref 150–400)
RBC: 3.83 MIL/uL — ABNORMAL LOW (ref 3.87–5.11)
RDW: 13.1 % (ref 11.5–15.5)
WBC Count: 3.1 10*3/uL — ABNORMAL LOW (ref 4.0–10.5)
nRBC: 0 % (ref 0.0–0.2)

## 2019-07-25 LAB — CK: Total CK: 56 U/L (ref 38–234)

## 2019-07-27 ENCOUNTER — Other Ambulatory Visit: Payer: Self-pay | Admitting: Oncology

## 2019-07-28 NOTE — Telephone Encounter (Signed)
Oral Oncology Patient Advocate Encounter  The patient brought in her denial letter for LIS from the [employer].  I faxed that letter to [facility] today, 07/28/19.  [place] Patient [place] Phone [phone] Fax [phone] 07/28/2019   2:13 PM

## 2019-07-31 ENCOUNTER — Inpatient Hospital Stay: Payer: PPO | Attending: Oncology | Admitting: Nurse Practitioner

## 2019-07-31 ENCOUNTER — Inpatient Hospital Stay: Payer: PPO

## 2019-07-31 ENCOUNTER — Encounter: Payer: Self-pay | Admitting: Nurse Practitioner

## 2019-07-31 ENCOUNTER — Other Ambulatory Visit: Payer: Self-pay

## 2019-07-31 VITALS — BP 128/63 | HR 75 | Temp 98.9°F | Resp 18 | Ht 65.0 in | Wt 130.5 lb

## 2019-07-31 DIAGNOSIS — R140 Abdominal distension (gaseous): Secondary | ICD-10-CM | POA: Insufficient documentation

## 2019-07-31 DIAGNOSIS — Z9221 Personal history of antineoplastic chemotherapy: Secondary | ICD-10-CM | POA: Insufficient documentation

## 2019-07-31 DIAGNOSIS — Z79899 Other long term (current) drug therapy: Secondary | ICD-10-CM | POA: Insufficient documentation

## 2019-07-31 DIAGNOSIS — Z452 Encounter for adjustment and management of vascular access device: Secondary | ICD-10-CM | POA: Insufficient documentation

## 2019-07-31 DIAGNOSIS — C221 Intrahepatic bile duct carcinoma: Secondary | ICD-10-CM | POA: Diagnosis not present

## 2019-07-31 DIAGNOSIS — R110 Nausea: Secondary | ICD-10-CM | POA: Insufficient documentation

## 2019-07-31 LAB — CMP (CANCER CENTER ONLY)
ALT: 12 U/L (ref 0–44)
AST: 18 U/L (ref 15–41)
Albumin: 3.9 g/dL (ref 3.5–5.0)
Alkaline Phosphatase: 129 U/L — ABNORMAL HIGH (ref 38–126)
Anion gap: 10 (ref 5–15)
BUN: 14 mg/dL (ref 8–23)
CO2: 25 mmol/L (ref 22–32)
Calcium: 9.7 mg/dL (ref 8.9–10.3)
Chloride: 106 mmol/L (ref 98–111)
Creatinine: 0.78 mg/dL (ref 0.44–1.00)
GFR, Est AFR Am: >60 mL/min (ref >60)
GFR, Estimated: >60 mL/min (ref >60)
Glucose, Bld: 114 mg/dL — ABNORMAL HIGH (ref 70–99)
Potassium: 3.4 mmol/L — ABNORMAL LOW (ref 3.5–5.1)
Sodium: 141 mmol/L (ref 135–145)
Total Bilirubin: 0.3 mg/dL (ref 0.3–1.2)
Total Protein: 6.7 g/dL (ref 6.5–8.1)

## 2019-07-31 LAB — CBC WITH DIFFERENTIAL (CANCER CENTER ONLY)
Abs Immature Granulocytes: 0.01 10*3/uL (ref 0.00–0.07)
Basophils Absolute: 0 10*3/uL (ref 0.0–0.1)
Basophils Relative: 1 %
Eosinophils Absolute: 0 10*3/uL (ref 0.0–0.5)
Eosinophils Relative: 1 %
HCT: 35.9 % — ABNORMAL LOW (ref 36.0–46.0)
Hemoglobin: 12.1 g/dL (ref 12.0–15.0)
Immature Granulocytes: 0 %
Lymphocytes Relative: 21 %
Lymphs Abs: 0.8 10*3/uL (ref 0.7–4.0)
MCH: 32.4 pg (ref 26.0–34.0)
MCHC: 33.7 g/dL (ref 30.0–36.0)
MCV: 96 fL (ref 80.0–100.0)
Monocytes Absolute: 0.5 10*3/uL (ref 0.1–1.0)
Monocytes Relative: 12 %
Neutro Abs: 2.5 10*3/uL (ref 1.7–7.7)
Neutrophils Relative %: 65 %
Platelet Count: 165 10*3/uL (ref 150–400)
RBC: 3.74 MIL/uL — ABNORMAL LOW (ref 3.87–5.11)
RDW: 12.7 % (ref 11.5–15.5)
WBC Count: 3.9 10*3/uL — ABNORMAL LOW (ref 4.0–10.5)
nRBC: 0 % (ref 0.0–0.2)

## 2019-07-31 LAB — CK: Total CK: 55 U/L (ref 38–234)

## 2019-07-31 MED ORDER — PROCHLORPERAZINE MALEATE 5 MG PO TABS: 5.0000 mg | ORAL_TABLET | Freq: Four times a day (QID) | ORAL | 1 refills | Status: DC | PRN

## 2019-07-31 NOTE — Progress Notes (Signed)
[place] OFFICE PROGRESS NOTE   Diagnosis:  Intrahepatic cholangiocarcinoma  INTERVAL HISTORY:   Laura [name] returns as scheduled. She began Ivosidenib 07/19/2019.  She has intermittent nausea, some days worse than others.  Compazine is effective.  No diarrhea.  No mouth sores.  No skin rash.  She denies fever, cough, shortness of breath.  No leg swelling.  No weight change.  She denies pain.  Appetite varies.  On 07/09/2019 and on 07/23/2019 she had an episode each day of blood with nose blowing.  No other bleeding.  Objective:  Vital signs in last 24 hours:  Blood pressure 128/63, pulse 75, temperature 98.9 F (37.2 C), temperature source Oral, resp. rate 18, height _0  (1.651 m), weight 130 lb 8 oz (59.2 kg), SpO2 98 %.    Resp: Lungs clear bilaterally. Cardio: Regular rate and rhythm. GI: Abdomen soft and nontender.  No hepatomegaly.  No mass. Vascular: No leg edema.  Calves soft and nontender. Neuro: Alert and oriented. Skin: No rash. Port-A-Cath without erythema.   Lab Results:  Lab Results  Component Value Date   WBC 3.9 (L) 07/31/2019   HGB 12.1 07/31/2019   HCT 35.9 (L) 07/31/2019   MCV 96.0 07/31/2019   PLT 165 07/31/2019   NEUTROABS 2.5 07/31/2019    Imaging:  No results found.  Medications: I have reviewed the patient's current medications.  Assessment/Plan: 1. Right liver mass-biopsy 07/10/2016 confirmed adenocarcinoma, PDL 1 0%, MSS, tumor mutation burden 3, IDH1 and PIK3 CA alterations  CT abdomen/pelvis 06/17/2016 and MRI abdomen 06/21/2016 confirmed an isolated right liver mass and a cystic pancreas lesion  PET scan 07/28/2016-the hepatic lesion is not hypermetabolic, hypermetabolic lesion in the gastric antrum suspicious for a primary neoplasm and no other evidence of metastatic disease  Upper endoscopy 08/03/2016-no mass found, biopsy from the gastric antrum-benign  CT-guided ablation of the right liver lesion 11/03/2016   MRI abdomen 03/08/2017-ablation defect in the right liver without residual/recurrent disease  MRI liver 06/11/2017-multiple new enhancing lesions in the right hepatic lobe surrounding the ablation defect consistent with progressive cholangiocarcinoma  Y-90 radioembolization of the right liver 07/19/2017  MRI abdomen 10/09/2017-suspected progression of intrahepatic larger carcinoma within segment 6, reviewed by interventional radiology most consistent with pseudo-progression  MRI abdomen 01/08/2018-slight decrease in the segment 6 mass, no change in enhancement at the ablation bed, new tiny arterial phase area of hyperenhancement in the lateral left liver-potentially a vascular phenomena  MRI abdomen 04/03/2018-stable segment 6 mass, no change in 10 mm flash filling segment 3 lesion-potentially a vascular shunt, a porta hepatis node measures 2.2 cm compared to 1.5 cm, no new lymphadenopathy  MRI abdomen 07/03/2018 stable segment 6 liver mass, stable segment 3 arterial phase enhancing lesion stable porta hepatis adenopathy, stable pancreas cystic lesion  MRI abdomen 11/20/2018-new 2.1 cm lateral left liver lesion, new 8 mm medial left liver lesion, no change in posterior right hepatic mass, no change in necrotic hepato-duodenal ligament lymph node no change in cystic pancreas lesion  Y 90 of the left liver 01/16/2019  Cycle 1 gemcitabine/cisplatin 03/12/2019  Cycle 2 gemcitabine/cisplatin 04/09/2019 (doses reduced due to neutropenia)  Cycle 3 gemcitabine/cisplatin 05/07/2019  Cycle 4 gemcitabine/cisplatin 05/28/2019-Neulasta not given  Cycle 5 gemcitabine/cisplatin 06/18/2019  MRI abdomen 07/08/2019- progressive liver metastases  Ivosidenib 07/19/2019 2. Left leg and right foot pain  Negative left leg Doppler 07/14/2016   3. Right abdominal pain-potentially related to the dominant right liver mass  4. Anorexia/weight loss  5. Benign  appearing 16 mm cystic uncinatepancreas  lesion noted on MRI 06/20/2016   Slightly larger on an MRI 03/08/2017, felt to most likely represent a side branch intraductal papillary mucinous neoplasm  Stable on MRI 10/09/2017  Stable on MRI 01/08/2018  Stable on MRI 04/03/2018  Stable on MRI 07/03/2018  Stable on MRI 11/20/2018  6. Anemia-iron deficiency  7. Port-A-Cath placement interventional radiology 03/04/2019  8.Neutropenia secondary to chemotherapy-gemcitabineand cisplatindose reducedbeginning with cycle 2; mild neutropenia and thrombocytopenia 04/22/2019. Gemcitabine and cisplatin held. Treatment changed to every 3 weeks beginning on 05/14/2019.  Disposition: Laura [name] appears stable.  She will continue Ivosidenib.  Overall she seems to be tolerating it well.  We reviewed the CBC and chemistry panel from today, adequate to continue with treatment.  We will follow-up on the CK.  QTc interval is in an acceptable range.  She will return for lab, Port-A-Cath flush, EKG and follow-up on 08/06/2019.  She will contact the office in the interim with any problems.  Plan reviewed with Dr. Benay Spice.    Ned Card ANP/GNP-BC   07/31/2019  3:22 PM

## 2019-08-01 ENCOUNTER — Telehealth: Payer: Self-pay | Admitting: Oncology

## 2019-08-01 NOTE — Telephone Encounter (Signed)
Called and left msg for patient  °

## 2019-08-06 ENCOUNTER — Other Ambulatory Visit: Payer: Self-pay

## 2019-08-06 ENCOUNTER — Inpatient Hospital Stay (HOSPITAL_BASED_OUTPATIENT_CLINIC_OR_DEPARTMENT_OTHER): Payer: PPO | Admitting: Oncology

## 2019-08-06 ENCOUNTER — Telehealth: Payer: Self-pay | Admitting: Oncology

## 2019-08-06 ENCOUNTER — Inpatient Hospital Stay: Payer: PPO

## 2019-08-06 ENCOUNTER — Other Ambulatory Visit: Payer: PPO

## 2019-08-06 VITALS — BP 139/62 | HR 71 | Temp 98.2°F | Resp 17 | Ht 65.0 in | Wt 131.3 lb

## 2019-08-06 DIAGNOSIS — C221 Intrahepatic bile duct carcinoma: Secondary | ICD-10-CM | POA: Diagnosis not present

## 2019-08-06 DIAGNOSIS — Z95828 Presence of other vascular implants and grafts: Secondary | ICD-10-CM

## 2019-08-06 LAB — CBC WITH DIFFERENTIAL (CANCER CENTER ONLY)
Abs Immature Granulocytes: 0.01 10*3/uL (ref 0.00–0.07)
Basophils Absolute: 0 10*3/uL (ref 0.0–0.1)
Basophils Relative: 1 %
Eosinophils Absolute: 0.1 10*3/uL (ref 0.0–0.5)
Eosinophils Relative: 2 %
HCT: 35.8 % — ABNORMAL LOW (ref 36.0–46.0)
Hemoglobin: 12 g/dL (ref 12.0–15.0)
Immature Granulocytes: 0 %
Lymphocytes Relative: 20 %
Lymphs Abs: 0.6 10*3/uL — ABNORMAL LOW (ref 0.7–4.0)
MCH: 32.3 pg (ref 26.0–34.0)
MCHC: 33.5 g/dL (ref 30.0–36.0)
MCV: 96.5 fL (ref 80.0–100.0)
Monocytes Absolute: 0.4 10*3/uL (ref 0.1–1.0)
Monocytes Relative: 12 %
Neutro Abs: 2.1 10*3/uL (ref 1.7–7.7)
Neutrophils Relative %: 65 %
Platelet Count: 143 10*3/uL — ABNORMAL LOW (ref 150–400)
RBC: 3.71 MIL/uL — ABNORMAL LOW (ref 3.87–5.11)
RDW: 12.4 % (ref 11.5–15.5)
WBC Count: 3.2 10*3/uL — ABNORMAL LOW (ref 4.0–10.5)
nRBC: 0 % (ref 0.0–0.2)

## 2019-08-06 LAB — CMP (CANCER CENTER ONLY)
ALT: 13 U/L (ref 0–44)
AST: 18 U/L (ref 15–41)
Albumin: 3.9 g/dL (ref 3.5–5.0)
Alkaline Phosphatase: 116 U/L (ref 38–126)
Anion gap: 8 (ref 5–15)
BUN: 11 mg/dL (ref 8–23)
CO2: 28 mmol/L (ref 22–32)
Calcium: 9.6 mg/dL (ref 8.9–10.3)
Chloride: 105 mmol/L (ref 98–111)
Creatinine: 0.78 mg/dL (ref 0.44–1.00)
GFR, Est AFR Am: >60 mL/min (ref >60)
GFR, Estimated: >60 mL/min (ref >60)
Glucose, Bld: 91 mg/dL (ref 70–99)
Potassium: 3.8 mmol/L (ref 3.5–5.1)
Sodium: 141 mmol/L (ref 135–145)
Total Bilirubin: 0.4 mg/dL (ref 0.3–1.2)
Total Protein: 6.6 g/dL (ref 6.5–8.1)

## 2019-08-06 LAB — CK: Total CK: 56 U/L (ref 38–234)

## 2019-08-06 MED ORDER — HEPARIN SOD (PORK) LOCK FLUSH 100 UNIT/ML IV SOLN
500.0000 [IU] | Freq: Once | INTRAVENOUS | Status: AC
  Administered 2019-08-06: 500 [IU]
  Filled 2019-08-06: qty 5

## 2019-08-06 MED ORDER — SODIUM CHLORIDE 0.9% FLUSH
10.0000 mL | Freq: Once | INTRAVENOUS | Status: AC
  Administered 2019-08-06: 10 mL
  Filled 2019-08-06: qty 10

## 2019-08-06 NOTE — Patient Instructions (Signed)

## 2019-08-06 NOTE — Progress Notes (Signed)
[place] OFFICE PROGRESS NOTE   Diagnosis: Cholangiocarcinoma  INTERVAL HISTORY:   Laura [name] began the ivosidenib on 07/19/2019.  No rash or diarrhea.  She has nausea in the mornings, relieved with Compazine.  She has noted a decrease in her energy level.  She has lower abdominal bloating in the evening.  Objective:  Vital signs in last 24 hours:  Blood pressure 139/62, pulse 71, temperature 98.2 F (36.8 C), temperature source Temporal, resp. rate 17, height _0  (1.651 m), weight 131 lb 5 oz (59.6 kg), SpO2 99 %.   Cardio: Regular rate and rhythm GI: No hepatomegaly, nontender, soft, nondistended Vascular: No leg edema   Lab Results:  Lab Results  Component Value Date   WBC 3.2 (L) 08/06/2019   HGB 12.0 08/06/2019   HCT 35.8 (L) 08/06/2019   MCV 96.5 08/06/2019   PLT 143 (L) 08/06/2019   NEUTROABS 2.1 08/06/2019    CMP  Lab Results  Component Value Date   NA 141 07/31/2019   K 3.4 (L) 07/31/2019   CL 106 07/31/2019   CO2 25 07/31/2019   GLUCOSE 114 (H) 07/31/2019   BUN 14 07/31/2019   CREATININE 0.78 07/31/2019   CALCIUM 9.7 07/31/2019   PROT 6.7 07/31/2019   ALBUMIN 3.9 07/31/2019   AST 18 07/31/2019   ALT 12 07/31/2019   ALKPHOS 129 (H) 07/31/2019   BILITOT 0.3 07/31/2019   GFRNONAA >60 07/31/2019   GFRAA >60 07/31/2019    Lab Results  Component Value Date   CEA1 <1.00 02/21/2019    EKG-normal sinus rhythm, QTC 456 Medications: I have reviewed the patient's current medications.   Assessment/Plan: 1. Right liver mass-biopsy 07/10/2016 confirmed adenocarcinoma, PDL 1 0%, MSS, tumor mutation burden 3, IDH1 and PIK3 CA alterations  CT abdomen/pelvis 06/17/2016 and MRI abdomen 06/21/2016 confirmed an isolated right liver mass and a cystic pancreas lesion  PET scan 07/28/2016-the hepatic lesion is not hypermetabolic, hypermetabolic lesion in the gastric antrum suspicious for a primary neoplasm and no other evidence of metastatic  disease  Upper endoscopy 08/03/2016-no mass found, biopsy from the gastric antrum-benign  CT-guided ablation of the right liver lesion 11/03/2016  MRI abdomen 03/08/2017-ablation defect in the right liver without residual/recurrent disease  MRI liver 06/11/2017-multiple new enhancing lesions in the right hepatic lobe surrounding the ablation defect consistent with progressive cholangiocarcinoma  Y-90 radioembolization of the right liver 07/19/2017  MRI abdomen 10/09/2017-suspected progression of intrahepatic larger carcinoma within segment 6, reviewed by interventional radiology most consistent with pseudo-progression  MRI abdomen 01/08/2018-slight decrease in the segment 6 mass, no change in enhancement at the ablation bed, new tiny arterial phase area of hyperenhancement in the lateral left liver-potentially a vascular phenomena  MRI abdomen 04/03/2018-stable segment 6 mass, no change in 10 mm flash filling segment 3 lesion-potentially a vascular shunt, a porta hepatis node measures 2.2 cm compared to 1.5 cm, no new lymphadenopathy  MRI abdomen 07/03/2018 stable segment 6 liver mass, stable segment 3 arterial phase enhancing lesion stable porta hepatis adenopathy, stable pancreas cystic lesion  MRI abdomen 11/20/2018-new 2.1 cm lateral left liver lesion, new 8 mm medial left liver lesion, no change in posterior right hepatic mass, no change in necrotic hepato-duodenal ligament lymph node no change in cystic pancreas lesion  Y 90 of the left liver 01/16/2019  Cycle 1 gemcitabine/cisplatin 03/12/2019  Cycle 2 gemcitabine/cisplatin 04/09/2019 (doses reduced due to neutropenia)  Cycle 3 gemcitabine/cisplatin 05/07/2019  Cycle 4 gemcitabine/cisplatin 05/28/2019-Neulasta not given  Cycle 5  gemcitabine/cisplatin 06/18/2019  MRI abdomen 07/08/2019- progressive liver metastases  Ivosidenib 07/19/2019 2. Left leg and right foot pain  Negative left leg Doppler 07/14/2016   3. Right  abdominal pain-potentially related to the dominant right liver mass  4. Anorexia/weight loss  5. Benign appearing 16 mm cystic uncinatepancreas lesion noted on MRI 06/20/2016   Slightly larger on an MRI 03/08/2017, felt to most likely represent a side branch intraductal papillary mucinous neoplasm  Stable on MRI 10/09/2017  Stable on MRI 01/08/2018  Stable on MRI 04/03/2018  Stable on MRI 07/03/2018  Stable on MRI 11/20/2018  6. Anemia-iron deficiency  7. Port-A-Cath placement interventional radiology 03/04/2019  8.Neutropenia secondary to chemotherapy-gemcitabineand cisplatindose reducedbeginning with cycle 2; mild neutropenia and thrombocytopenia 04/22/2019. Gemcitabine and cisplatin held. Treatment changed to every 3 weeks beginning on 05/14/2019.    Disposition: Laura [name] is tolerating the ivosidenib well.  She will continue the current treatment.  She will return for a lab visit and EKG in 1 week.  She will be scheduled for a 2-week office visit. We will plan for a restaging MRI of the liver in 3-4 months.  [name], MD  08/06/2019  11:31 AM

## 2019-08-06 NOTE — Telephone Encounter (Signed)
Scheduled appt per 8/12 los - gave patient AVS and calender per los.   

## 2019-08-07 ENCOUNTER — Other Ambulatory Visit: Payer: Self-pay | Admitting: Oncology

## 2019-08-07 DIAGNOSIS — C221 Intrahepatic bile duct carcinoma: Secondary | ICD-10-CM

## 2019-08-13 ENCOUNTER — Other Ambulatory Visit: Payer: Self-pay

## 2019-08-13 ENCOUNTER — Inpatient Hospital Stay: Payer: PPO

## 2019-08-13 DIAGNOSIS — C221 Intrahepatic bile duct carcinoma: Secondary | ICD-10-CM | POA: Diagnosis not present

## 2019-08-13 LAB — CBC WITH DIFFERENTIAL (CANCER CENTER ONLY)
Abs Immature Granulocytes: 0 10*3/uL (ref 0.00–0.07)
Basophils Absolute: 0 10*3/uL (ref 0.0–0.1)
Basophils Relative: 1 %
Eosinophils Absolute: 0.1 10*3/uL (ref 0.0–0.5)
Eosinophils Relative: 2 %
HCT: 36.7 % (ref 36.0–46.0)
Hemoglobin: 12.6 g/dL (ref 12.0–15.0)
Immature Granulocytes: 0 %
Lymphocytes Relative: 26 %
Lymphs Abs: 0.7 10*3/uL (ref 0.7–4.0)
MCH: 32.5 pg (ref 26.0–34.0)
MCHC: 34.3 g/dL (ref 30.0–36.0)
MCV: 94.6 fL (ref 80.0–100.0)
Monocytes Absolute: 0.3 10*3/uL (ref 0.1–1.0)
Monocytes Relative: 12 %
Neutro Abs: 1.6 10*3/uL — ABNORMAL LOW (ref 1.7–7.7)
Neutrophils Relative %: 59 %
Platelet Count: 160 10*3/uL (ref 150–400)
RBC: 3.88 MIL/uL (ref 3.87–5.11)
RDW: 12.1 % (ref 11.5–15.5)
WBC Count: 2.8 10*3/uL — ABNORMAL LOW (ref 4.0–10.5)
nRBC: 0 % (ref 0.0–0.2)

## 2019-08-13 LAB — CMP (CANCER CENTER ONLY)
ALT: 11 U/L (ref 0–44)
AST: 18 U/L (ref 15–41)
Albumin: 3.9 g/dL (ref 3.5–5.0)
Alkaline Phosphatase: 119 U/L (ref 38–126)
Anion gap: 8 (ref 5–15)
BUN: 9 mg/dL (ref 8–23)
CO2: 26 mmol/L (ref 22–32)
Calcium: 9.8 mg/dL (ref 8.9–10.3)
Chloride: 106 mmol/L (ref 98–111)
Creatinine: 0.72 mg/dL (ref 0.44–1.00)
GFR, Est AFR Am: >60 mL/min (ref >60)
GFR, Estimated: >60 mL/min (ref >60)
Glucose, Bld: 115 mg/dL — ABNORMAL HIGH (ref 70–99)
Potassium: 3.6 mmol/L (ref 3.5–5.1)
Sodium: 140 mmol/L (ref 135–145)
Total Bilirubin: 0.4 mg/dL (ref 0.3–1.2)
Total Protein: 6.8 g/dL (ref 6.5–8.1)

## 2019-08-13 LAB — PHOSPHORUS: Phosphorus: 3.7 mg/dL (ref 2.5–4.6)

## 2019-08-13 LAB — CK: Total CK: 49 U/L (ref 38–234)

## 2019-08-19 ENCOUNTER — Other Ambulatory Visit: Payer: Self-pay | Admitting: *Deleted

## 2019-08-19 ENCOUNTER — Other Ambulatory Visit: Payer: Self-pay | Admitting: Nurse Practitioner

## 2019-08-19 DIAGNOSIS — C221 Intrahepatic bile duct carcinoma: Secondary | ICD-10-CM

## 2019-08-20 ENCOUNTER — Telehealth: Payer: Self-pay | Admitting: Nurse Practitioner

## 2019-08-20 ENCOUNTER — Other Ambulatory Visit: Payer: Self-pay

## 2019-08-20 ENCOUNTER — Inpatient Hospital Stay (HOSPITAL_BASED_OUTPATIENT_CLINIC_OR_DEPARTMENT_OTHER): Payer: PPO | Admitting: Nurse Practitioner

## 2019-08-20 ENCOUNTER — Inpatient Hospital Stay: Payer: PPO

## 2019-08-20 ENCOUNTER — Encounter: Payer: Self-pay | Admitting: Nurse Practitioner

## 2019-08-20 VITALS — BP 129/61 | HR 77 | Temp 98.2°F | Resp 17 | Ht 65.0 in | Wt 131.7 lb

## 2019-08-20 DIAGNOSIS — C221 Intrahepatic bile duct carcinoma: Secondary | ICD-10-CM

## 2019-08-20 LAB — CBC WITH DIFFERENTIAL (CANCER CENTER ONLY)
Abs Immature Granulocytes: 0 10*3/uL (ref 0.00–0.07)
Basophils Absolute: 0 10*3/uL (ref 0.0–0.1)
Basophils Relative: 1 %
Eosinophils Absolute: 0 10*3/uL (ref 0.0–0.5)
Eosinophils Relative: 1 %
HCT: 37.6 % (ref 36.0–46.0)
Hemoglobin: 12.7 g/dL (ref 12.0–15.0)
Immature Granulocytes: 0 %
Lymphocytes Relative: 24 %
Lymphs Abs: 0.6 10*3/uL — ABNORMAL LOW (ref 0.7–4.0)
MCH: 32.3 pg (ref 26.0–34.0)
MCHC: 33.8 g/dL (ref 30.0–36.0)
MCV: 95.7 fL (ref 80.0–100.0)
Monocytes Absolute: 0.3 10*3/uL (ref 0.1–1.0)
Monocytes Relative: 13 %
Neutro Abs: 1.5 10*3/uL — ABNORMAL LOW (ref 1.7–7.7)
Neutrophils Relative %: 61 %
Platelet Count: 166 10*3/uL (ref 150–400)
RBC: 3.93 MIL/uL (ref 3.87–5.11)
RDW: 11.8 % (ref 11.5–15.5)
WBC Count: 2.4 10*3/uL — ABNORMAL LOW (ref 4.0–10.5)
nRBC: 0 % (ref 0.0–0.2)

## 2019-08-20 LAB — CMP (CANCER CENTER ONLY)
ALT: 10 U/L (ref 0–44)
AST: 17 U/L (ref 15–41)
Albumin: 3.9 g/dL (ref 3.5–5.0)
Alkaline Phosphatase: 119 U/L (ref 38–126)
Anion gap: 10 (ref 5–15)
BUN: 13 mg/dL (ref 8–23)
CO2: 27 mmol/L (ref 22–32)
Calcium: 9.6 mg/dL (ref 8.9–10.3)
Chloride: 104 mmol/L (ref 98–111)
Creatinine: 0.69 mg/dL (ref 0.44–1.00)
GFR, Est AFR Am: >60 mL/min (ref >60)
GFR, Estimated: >60 mL/min (ref >60)
Glucose, Bld: 105 mg/dL — ABNORMAL HIGH (ref 70–99)
Potassium: 3.6 mmol/L (ref 3.5–5.1)
Sodium: 141 mmol/L (ref 135–145)
Total Bilirubin: 0.4 mg/dL (ref 0.3–1.2)
Total Protein: 6.7 g/dL (ref 6.5–8.1)

## 2019-08-20 LAB — PHOSPHORUS: Phosphorus: 3.6 mg/dL (ref 2.5–4.6)

## 2019-08-20 LAB — CK: Total CK: 48 U/L (ref 38–234)

## 2019-08-20 NOTE — Progress Notes (Signed)
[place] OFFICE PROGRESS NOTE   Diagnosis: Cholangiocarcinoma  INTERVAL HISTORY:   Ms. Folds returns as scheduled.  She continues Ivosidenib.  She overall feels well.  She notes a slight decrease in energy.  Appetite varies.  Weight is stable.  No nausea or vomiting.  No diarrhea or constipation.  No abdominal pain.  No rash.  She denies fever, cough, shortness of breath.  No leg swelling.  No dizziness.  No unusual headaches.  No numbness or tingling in the hands or feet.  Objective:  Vital signs in last 24 hours:  Blood pressure 129/61, pulse 77, temperature 98.2 F (36.8 C), temperature source Oral, resp. rate 17, height '5\' 5"'  (1.651 m), weight 131 lb 11.2 oz (59.7 kg), SpO2 98 %.    HEENT: No thrush or ulcers. Resp: Lungs clear bilaterally. Cardio: Regular rate and rhythm. GI: Abdomen soft and nontender.  No hepatomegaly. Vascular: No leg edema. Neuro: Alert and oriented. Skin: No rash. Port-A-Cath without erythema.   Lab Results:  Lab Results  Component Value Date   WBC 2.4 (L) 08/20/2019   HGB 12.7 08/20/2019   HCT 37.6 08/20/2019   MCV 95.7 08/20/2019   PLT 166 08/20/2019   NEUTROABS 1.5 (L) 08/20/2019    Imaging:  No results found.  Medications: I have reviewed the patient's current medications.  Assessment/Plan: 1. Right liver mass-biopsy 07/10/2016 confirmed adenocarcinoma, PDL 1 0%, MSS, tumor mutation burden 3, IDH1 and PIK3 CA alterations  CT abdomen/pelvis 06/17/2016 and MRI abdomen 06/21/2016 confirmed an isolated right liver mass and a cystic pancreas lesion  PET scan 07/28/2016-the hepatic lesion is not hypermetabolic, hypermetabolic lesion in the gastric antrum suspicious for a primary neoplasm and no other evidence of metastatic disease  Upper endoscopy 08/03/2016-no mass found, biopsy from the gastric antrum-benign  CT-guided ablation of the right liver lesion 11/03/2016  MRI abdomen 03/08/2017-ablation defect in the  right liver without residual/recurrent disease  MRI liver 06/11/2017-multiple new enhancing lesions in the right hepatic lobe surrounding the ablation defect consistent with progressive cholangiocarcinoma  Y-90 radioembolization of the right liver 07/19/2017  MRI abdomen 10/09/2017-suspected progression of intrahepatic larger carcinoma within segment 6, reviewed by interventional radiology most consistent with pseudo-progression  MRI abdomen 01/08/2018-slight decrease in the segment 6 mass, no change in enhancement at the ablation bed, new tiny arterial phase area of hyperenhancement in the lateral left liver-potentially a vascular phenomena  MRI abdomen 04/03/2018-stable segment 6 mass, no change in 10 mm flash filling segment 3 lesion-potentially a vascular shunt, a porta hepatis node measures 2.2 cm compared to 1.5 cm, no new lymphadenopathy  MRI abdomen 07/03/2018 stable segment 6 liver mass, stable segment 3 arterial phase enhancing lesion stable porta hepatis adenopathy, stable pancreas cystic lesion  MRI abdomen 11/20/2018-new 2.1 cm lateral left liver lesion, new 8 mm medial left liver lesion, no change in posterior right hepatic mass, no change in necrotic hepato-duodenal ligament lymph node no change in cystic pancreas lesion  Y 90 of the left liver 01/16/2019  Cycle 1 gemcitabine/cisplatin 03/12/2019  Cycle 2 gemcitabine/cisplatin 04/09/2019 (doses reduced due to neutropenia)  Cycle 3 gemcitabine/cisplatin 05/07/2019  Cycle 4 gemcitabine/cisplatin 05/28/2019-Neulasta not given  Cycle 5 gemcitabine/cisplatin 06/18/2019  MRI abdomen 07/08/2019-progressive liver metastases  Ivosidenib 07/19/2019 2. Left leg and right foot pain  Negative left leg Doppler 07/14/2016   3. Right abdominal pain-potentially related to the dominant right liver mass  4. Anorexia/weight loss  5. Benign appearing 16 mm cystic uncinatepancreas lesion noted on MRI  06/20/2016   Slightly  larger on an MRI 03/08/2017, felt to most likely represent a side branch intraductal papillary mucinous neoplasm  Stable on MRI 10/09/2017  Stable on MRI 01/08/2018  Stable on MRI 04/03/2018  Stable on MRI 07/03/2018  Stable on MRI 11/20/2018  6. Anemia-iron deficiency  7. Port-A-Cath placement interventional radiology 03/04/2019  8.Neutropenia secondary to chemotherapy-gemcitabineand cisplatindose reducedbeginning with cycle 2; mild neutropenia and thrombocytopenia 04/22/2019. Gemcitabine and cisplatin held. Treatment changed to every 3 weeks beginning on 05/14/2019.   Disposition: Laura [name] appears stable.  She is tolerating Ivosidenib well.  Plan to continue the same.  We reviewed the CBC and chemistry panel from today, adequate to continue with treatment as above.  We will follow-up on the phosphorus and CK level.  She will return for lab, EKG and follow-up in 2 weeks.  She will contact the office in the interim with any problems.  Plan reviewed with Dr. Benay Spice.  Ned Card ANP/GNP-BC   08/20/2019  9:16 AM

## 2019-08-20 NOTE — Telephone Encounter (Signed)
Scheduled per 08/26 los, patient received avs and calender.  °

## 2019-08-26 ENCOUNTER — Other Ambulatory Visit: Payer: Self-pay | Admitting: Pharmacist

## 2019-08-26 ENCOUNTER — Telehealth: Payer: Self-pay | Admitting: *Deleted

## 2019-08-26 NOTE — Telephone Encounter (Signed)
Called to report her blood sugars are dropping to 40's-80's and 113. Will feel shaky and head feels "fuzzy" when low. Asking what to do? States her diet is unchanged--still eats fast food/carbs. Encouraged her to keep protein/sweet snack with her always for when she feels that way. Most readings have been around 104-128. Per Dr. Benay Spice: Ivosidenib has no know adverse effect of hypoglycemia. Will continue to monitor. Instructed her to write down her readings with time of day to see if a pattern develops.

## 2019-09-03 ENCOUNTER — Other Ambulatory Visit: Payer: Self-pay

## 2019-09-03 ENCOUNTER — Inpatient Hospital Stay (HOSPITAL_BASED_OUTPATIENT_CLINIC_OR_DEPARTMENT_OTHER): Payer: PPO | Admitting: Nurse Practitioner

## 2019-09-03 ENCOUNTER — Encounter: Payer: Self-pay | Admitting: Nurse Practitioner

## 2019-09-03 ENCOUNTER — Telehealth: Payer: Self-pay | Admitting: Nurse Practitioner

## 2019-09-03 ENCOUNTER — Inpatient Hospital Stay: Payer: PPO | Attending: Oncology

## 2019-09-03 ENCOUNTER — Inpatient Hospital Stay: Payer: PPO

## 2019-09-03 VITALS — BP 122/58 | HR 79 | Temp 98.2°F | Resp 18 | Ht 65.0 in | Wt 131.8 lb

## 2019-09-03 DIAGNOSIS — C221 Intrahepatic bile duct carcinoma: Secondary | ICD-10-CM | POA: Diagnosis not present

## 2019-09-03 DIAGNOSIS — Z9221 Personal history of antineoplastic chemotherapy: Secondary | ICD-10-CM | POA: Insufficient documentation

## 2019-09-03 DIAGNOSIS — R630 Anorexia: Secondary | ICD-10-CM | POA: Insufficient documentation

## 2019-09-03 DIAGNOSIS — Z452 Encounter for adjustment and management of vascular access device: Secondary | ICD-10-CM | POA: Insufficient documentation

## 2019-09-03 DIAGNOSIS — Z95828 Presence of other vascular implants and grafts: Secondary | ICD-10-CM

## 2019-09-03 LAB — CMP (CANCER CENTER ONLY)
ALT: 10 U/L (ref 0–44)
AST: 20 U/L (ref 15–41)
Albumin: 4.2 g/dL (ref 3.5–5.0)
Alkaline Phosphatase: 124 U/L (ref 38–126)
Anion gap: 8 (ref 5–15)
BUN: 13 mg/dL (ref 8–23)
CO2: 29 mmol/L (ref 22–32)
Calcium: 10 mg/dL (ref 8.9–10.3)
Chloride: 103 mmol/L (ref 98–111)
Creatinine: 0.73 mg/dL (ref 0.44–1.00)
GFR, Est AFR Am: >60 mL/min (ref >60)
GFR, Estimated: >60 mL/min (ref >60)
Glucose, Bld: 90 mg/dL (ref 70–99)
Potassium: 3.8 mmol/L (ref 3.5–5.1)
Sodium: 140 mmol/L (ref 135–145)
Total Bilirubin: 0.5 mg/dL (ref 0.3–1.2)
Total Protein: 6.9 g/dL (ref 6.5–8.1)

## 2019-09-03 LAB — CBC WITH DIFFERENTIAL (CANCER CENTER ONLY)
Abs Immature Granulocytes: 0 10*3/uL (ref 0.00–0.07)
Basophils Absolute: 0 10*3/uL (ref 0.0–0.1)
Basophils Relative: 1 %
Eosinophils Absolute: 0 10*3/uL (ref 0.0–0.5)
Eosinophils Relative: 1 %
HCT: 38.3 % (ref 36.0–46.0)
Hemoglobin: 12.8 g/dL (ref 12.0–15.0)
Immature Granulocytes: 0 %
Lymphocytes Relative: 25 %
Lymphs Abs: 0.7 10*3/uL (ref 0.7–4.0)
MCH: 32.2 pg (ref 26.0–34.0)
MCHC: 33.4 g/dL (ref 30.0–36.0)
MCV: 96.5 fL (ref 80.0–100.0)
Monocytes Absolute: 0.4 10*3/uL (ref 0.1–1.0)
Monocytes Relative: 14 %
Neutro Abs: 1.6 10*3/uL — ABNORMAL LOW (ref 1.7–7.7)
Neutrophils Relative %: 59 %
Platelet Count: 181 10*3/uL (ref 150–400)
RBC: 3.97 MIL/uL (ref 3.87–5.11)
RDW: 11.8 % (ref 11.5–15.5)
WBC Count: 2.7 10*3/uL — ABNORMAL LOW (ref 4.0–10.5)
nRBC: 0 % (ref 0.0–0.2)

## 2019-09-03 LAB — CK: Total CK: 57 U/L (ref 38–234)

## 2019-09-03 LAB — PHOSPHORUS: Phosphorus: 4.5 mg/dL (ref 2.5–4.6)

## 2019-09-03 MED ORDER — HEPARIN SOD (PORK) LOCK FLUSH 100 UNIT/ML IV SOLN
500.0000 [IU] | Freq: Once | INTRAVENOUS | Status: AC
  Administered 2019-09-03: 500 [IU]
  Filled 2019-09-03: qty 5

## 2019-09-03 MED ORDER — SODIUM CHLORIDE 0.9% FLUSH
10.0000 mL | Freq: Once | INTRAVENOUS | Status: AC
  Administered 2019-09-03: 10 mL
  Filled 2019-09-03: qty 10

## 2019-09-03 NOTE — Progress Notes (Addendum)
[place] OFFICE PROGRESS NOTE   Diagnosis: Cholangiocarcinoma  INTERVAL HISTORY:   Laura [name] returns as scheduled.  Laura [name] continues Ivosidenib.  Laura [name] denies fever, cough, shortness of breath.  No rash.  No nausea or vomiting.  No mouth sores.  No diarrhea.  Laura [name] denies pain.  Laura [name] has a good appetite.  Laura [name] denies swelling.  No numbness or tingling in the hands or feet.  No dizziness.  No unusual headaches.  Energy level improves as the day progresses.  Objective:  Vital signs in last 24 hours:  Blood pressure (!) 122/58, pulse 79, temperature 98.2 F (36.8 C), temperature source Temporal, resp. rate 18, height '5\' 5"'  (1.651 m), weight 131 lb 12.8 oz (59.8 kg), SpO2 98 %.    HEENT: No thrush or ulcers. Resp: Lungs clear bilaterally. Cardio: Regular rate and rhythm. GI: Abdomen soft and nontender. Vascular: No leg edema.  Calves soft and nontender. Neuro: Alert and oriented. Skin: No rash. Port-A-Cath without erythema.  Lab Results:  Lab Results  Component Value Date   WBC 2.7 (L) 09/03/2019   HGB 12.8 09/03/2019   HCT 38.3 09/03/2019   MCV 96.5 09/03/2019   PLT 181 09/03/2019   NEUTROABS 1.6 (L) 09/03/2019    Imaging:  No results found.  Medications: I have reviewed the patient's current medications.  Assessment/Plan: 1. Right liver mass-biopsy 07/10/2016 confirmed adenocarcinoma, PDL 1 0%, MSS, tumor mutation burden 3, IDH1 and PIK3 CA alterations  CT abdomen/pelvis 06/17/2016 and MRI abdomen 06/21/2016 confirmed an isolated right liver mass and a cystic pancreas lesion  PET scan 07/28/2016-the hepatic lesion is not hypermetabolic, hypermetabolic lesion in the gastric antrum suspicious for a primary neoplasm and no other evidence of metastatic disease  Upper endoscopy 08/03/2016-no mass found, biopsy from the gastric antrum-benign  CT-guided ablation of the right liver lesion 11/03/2016  MRI abdomen 03/08/2017-ablation defect in the right liver  without residual/recurrent disease  MRI liver 06/11/2017-multiple new enhancing lesions in the right hepatic lobe surrounding the ablation defect consistent with progressive cholangiocarcinoma  Y-90 radioembolization of the right liver 07/19/2017  MRI abdomen 10/09/2017-suspected progression of intrahepatic larger carcinoma within segment 6, reviewed by interventional radiology most consistent with pseudo-progression  MRI abdomen 01/08/2018-slight decrease in the segment 6 mass, no change in enhancement at the ablation bed, new tiny arterial phase area of hyperenhancement in the lateral left liver-potentially a vascular phenomena  MRI abdomen 04/03/2018-stable segment 6 mass, no change in 10 mm flash filling segment 3 lesion-potentially a vascular shunt, a porta hepatis node measures 2.2 cm compared to 1.5 cm, no new lymphadenopathy  MRI abdomen 07/03/2018 stable segment 6 liver mass, stable segment 3 arterial phase enhancing lesion stable porta hepatis adenopathy, stable pancreas cystic lesion  MRI abdomen 11/20/2018-new 2.1 cm lateral left liver lesion, new 8 mm medial left liver lesion, no change in posterior right hepatic mass, no change in necrotic hepato-duodenal ligament lymph node no change in cystic pancreas lesion  Y 90 of the left liver 01/16/2019  Cycle 1 gemcitabine/cisplatin 03/12/2019  Cycle 2 gemcitabine/cisplatin 04/09/2019 (doses reduced due to neutropenia)  Cycle 3 gemcitabine/cisplatin 05/07/2019  Cycle 4 gemcitabine/cisplatin 05/28/2019-Neulasta not given  Cycle 5 gemcitabine/cisplatin 06/18/2019  MRI abdomen 07/08/2019-progressive liver metastases  Ivosidenib 07/19/2019 2. Left leg and right foot pain  Negative left leg Doppler 07/14/2016   3. Right abdominal pain-potentially related to the dominant right liver mass  4. Anorexia/weight loss  5. Benign appearing 16 mm cystic uncinatepancreas lesion noted on MRI 06/20/2016  Slightly larger on an  MRI 03/08/2017, felt to most likely represent a side branch intraductal papillary mucinous neoplasm  Stable on MRI 10/09/2017  Stable on MRI 01/08/2018  Stable on MRI 04/03/2018  Stable on MRI 07/03/2018  Stable on MRI 11/20/2018  6. Anemia-iron deficiency  7. Port-A-Cath placement interventional radiology 03/04/2019  8.Neutropenia secondary to chemotherapy-gemcitabineand cisplatindose reducedbeginning with cycle 2; mild neutropenia and thrombocytopenia 04/22/2019. Gemcitabine and cisplatin held. Treatment changed to every 3 weeks beginning on 05/14/2019.   Disposition: Laura [name] appears stable.  Laura [name] continues Ivosidenib and appears to be tolerating well.  We reviewed the CBC and chemistry panel from today, adequate to continue with treatment.  We will follow-up on the phosphorus and CK level.  EKG QTc in acceptable range.  Laura [name] will return for lab, EKG and follow-up in 4 weeks.  Laura [name] will contact the office in the interim with any problems.  Patient seen with Dr. Benay Spice.     Ned Card ANP/GNP-BC   09/03/2019  11:08 AM This was a shared visit with Ned Card.  Laura [name] appears stable and Laura [name] is tolerating the Ivosidenib well.  The plan is to continue the current treatment.  Laura [name] will return for an office visit in 4 weeks.  Laura Manson, MD

## 2019-09-03 NOTE — Telephone Encounter (Signed)
Scheduled appt per 9/9 los - gave patient AVS and calender per los.   

## 2019-09-05 ENCOUNTER — Other Ambulatory Visit: Payer: Self-pay | Admitting: Oncology

## 2019-09-05 DIAGNOSIS — C221 Intrahepatic bile duct carcinoma: Secondary | ICD-10-CM

## 2019-09-09 NOTE — Telephone Encounter (Signed)
Refill request

## 2019-09-16 DIAGNOSIS — L570 Actinic keratosis: Secondary | ICD-10-CM | POA: Diagnosis not present

## 2019-09-16 DIAGNOSIS — X32XXXD Exposure to sunlight, subsequent encounter: Secondary | ICD-10-CM | POA: Diagnosis not present

## 2019-09-16 DIAGNOSIS — L821 Other seborrheic keratosis: Secondary | ICD-10-CM | POA: Diagnosis not present

## 2019-09-16 DIAGNOSIS — D225 Melanocytic nevi of trunk: Secondary | ICD-10-CM | POA: Diagnosis not present

## 2019-09-16 DIAGNOSIS — L708 Other acne: Secondary | ICD-10-CM | POA: Diagnosis not present

## 2019-10-01 ENCOUNTER — Inpatient Hospital Stay: Payer: PPO

## 2019-10-01 ENCOUNTER — Inpatient Hospital Stay: Payer: PPO | Attending: Oncology | Admitting: Oncology

## 2019-10-01 ENCOUNTER — Other Ambulatory Visit: Payer: Self-pay

## 2019-10-01 VITALS — BP 134/59 | HR 70 | Temp 98.3°F | Resp 17 | Ht 65.0 in | Wt 134.1 lb

## 2019-10-01 DIAGNOSIS — Z9221 Personal history of antineoplastic chemotherapy: Secondary | ICD-10-CM | POA: Diagnosis not present

## 2019-10-01 DIAGNOSIS — D509 Iron deficiency anemia, unspecified: Secondary | ICD-10-CM | POA: Diagnosis not present

## 2019-10-01 DIAGNOSIS — R110 Nausea: Secondary | ICD-10-CM | POA: Insufficient documentation

## 2019-10-01 DIAGNOSIS — Z79899 Other long term (current) drug therapy: Secondary | ICD-10-CM | POA: Insufficient documentation

## 2019-10-01 DIAGNOSIS — R630 Anorexia: Secondary | ICD-10-CM | POA: Diagnosis not present

## 2019-10-01 DIAGNOSIS — C221 Intrahepatic bile duct carcinoma: Secondary | ICD-10-CM

## 2019-10-01 DIAGNOSIS — Z23 Encounter for immunization: Secondary | ICD-10-CM

## 2019-10-01 DIAGNOSIS — Z95828 Presence of other vascular implants and grafts: Secondary | ICD-10-CM

## 2019-10-01 LAB — CMP (CANCER CENTER ONLY)
ALT: 8 U/L (ref 0–44)
AST: 16 U/L (ref 15–41)
Albumin: 3.9 g/dL (ref 3.5–5.0)
Alkaline Phosphatase: 123 U/L (ref 38–126)
Anion gap: 8 (ref 5–15)
BUN: 11 mg/dL (ref 8–23)
CO2: 27 mmol/L (ref 22–32)
Calcium: 9.7 mg/dL (ref 8.9–10.3)
Chloride: 104 mmol/L (ref 98–111)
Creatinine: 0.65 mg/dL (ref 0.44–1.00)
GFR, Est AFR Am: >60 mL/min (ref >60)
GFR, Estimated: >60 mL/min (ref >60)
Glucose, Bld: 88 mg/dL (ref 70–99)
Potassium: 3.6 mmol/L (ref 3.5–5.1)
Sodium: 139 mmol/L (ref 135–145)
Total Bilirubin: 0.5 mg/dL (ref 0.3–1.2)
Total Protein: 6.7 g/dL (ref 6.5–8.1)

## 2019-10-01 LAB — CK: Total CK: 57 U/L (ref 38–234)

## 2019-10-01 LAB — CBC WITH DIFFERENTIAL (CANCER CENTER ONLY)
Abs Immature Granulocytes: 0.01 10*3/uL (ref 0.00–0.07)
Basophils Absolute: 0 10*3/uL (ref 0.0–0.1)
Basophils Relative: 1 %
Eosinophils Absolute: 0 10*3/uL (ref 0.0–0.5)
Eosinophils Relative: 1 %
HCT: 36.3 % (ref 36.0–46.0)
Hemoglobin: 12.4 g/dL (ref 12.0–15.0)
Immature Granulocytes: 0 %
Lymphocytes Relative: 30 %
Lymphs Abs: 0.8 10*3/uL (ref 0.7–4.0)
MCH: 32 pg (ref 26.0–34.0)
MCHC: 34.2 g/dL (ref 30.0–36.0)
MCV: 93.6 fL (ref 80.0–100.0)
Monocytes Absolute: 0.4 10*3/uL (ref 0.1–1.0)
Monocytes Relative: 14 %
Neutro Abs: 1.5 10*3/uL — ABNORMAL LOW (ref 1.7–7.7)
Neutrophils Relative %: 54 %
Platelet Count: 163 10*3/uL (ref 150–400)
RBC: 3.88 MIL/uL (ref 3.87–5.11)
RDW: 12.2 % (ref 11.5–15.5)
WBC Count: 2.8 10*3/uL — ABNORMAL LOW (ref 4.0–10.5)
nRBC: 0 % (ref 0.0–0.2)

## 2019-10-01 LAB — PHOSPHORUS: Phosphorus: 3.7 mg/dL (ref 2.5–4.6)

## 2019-10-01 MED ORDER — HEPARIN SOD (PORK) LOCK FLUSH 100 UNIT/ML IV SOLN
500.0000 [IU] | Freq: Once | INTRAVENOUS | Status: AC
  Administered 2019-10-01: 500 [IU]
  Filled 2019-10-01: qty 5

## 2019-10-01 MED ORDER — SODIUM CHLORIDE 0.9% FLUSH
10.0000 mL | Freq: Once | INTRAVENOUS | Status: AC
  Administered 2019-10-01: 10 mL
  Filled 2019-10-01: qty 10

## 2019-10-01 MED ORDER — INFLUENZA VAC A&B SA ADJ QUAD 0.5 ML IM PRSY
PREFILLED_SYRINGE | INTRAMUSCULAR | Status: AC
  Filled 2019-10-01: qty 0.5

## 2019-10-01 MED ORDER — INFLUENZA VAC A&B SA ADJ QUAD 0.5 ML IM PRSY
0.5000 mL | PREFILLED_SYRINGE | Freq: Once | INTRAMUSCULAR | Status: AC
  Administered 2019-10-01: 0.5 mL via INTRAMUSCULAR

## 2019-10-01 NOTE — Progress Notes (Signed)
[name] OFFICE PROGRESS NOTE   Diagnosis: Cholangiocarcinoma  INTERVAL HISTORY:   [name] returns as scheduled.  She reports mild abdominal discomfort.  Mild nausea relieved with Compazine.  She feels stable as compared to before starting the Ivosidenib.  No rash or diarrhea.  No muscle pain.  She has noted recent "sinus cold symptoms.  Objective:  Vital signs in last 24 hours:  Blood pressure (!) 134/59, pulse 70, temperature 98.3 F (36.8 C), temperature source Temporal, resp. rate 17, height '5\' 5"'  (1.651 m), weight 134 lb 1.6 oz (60.8 kg), SpO2 97 %.     Resp: Scattered end inspiratory rhonchi and wheezes, no respiratory distress Cardio: Regular rate and rhythm GI: No hepatomegaly, nontender Vascular: No leg edema  Portacath/PICC-without erythema  Lab Results:  Lab Results  Component Value Date   WBC 2.8 (L) 10/01/2019   HGB 12.4 10/01/2019   HCT 36.3 10/01/2019   MCV 93.6 10/01/2019   PLT 163 10/01/2019   NEUTROABS 1.5 (L) 10/01/2019    CMP  Lab Results  Component Value Date   NA 139 10/01/2019   K 3.6 10/01/2019   CL 104 10/01/2019   CO2 27 10/01/2019   GLUCOSE 88 10/01/2019   BUN 11 10/01/2019   CREATININE 0.65 10/01/2019   CALCIUM 9.7 10/01/2019   PROT 6.7 10/01/2019   ALBUMIN 3.9 10/01/2019   AST 16 10/01/2019   ALT 8 10/01/2019   ALKPHOS 123 10/01/2019   BILITOT 0.5 10/01/2019   GFRNONAA >60 10/01/2019   GFRAA >60 10/01/2019    Lab Results  Component Value Date   CEA1 <1.00 02/21/2019   EKG: Normal sinus rhythm, QTC 460  Medications: I have reviewed the patient's current medications.   Assessment/Plan:  1. Right liver mass-biopsy 07/10/2016 confirmed adenocarcinoma, PDL 1 0%, MSS, tumor mutation burden 3, IDH1 and PIK3 CA alterations  CT abdomen/pelvis 06/17/2016 and MRI abdomen 06/21/2016 confirmed an isolated right liver mass and a cystic pancreas lesion  PET scan 07/28/2016-the hepatic lesion is not  hypermetabolic, hypermetabolic lesion in the gastric antrum suspicious for a primary neoplasm and no other evidence of metastatic disease  Upper endoscopy 08/03/2016-no mass found, biopsy from the gastric antrum-benign  CT-guided ablation of the right liver lesion 11/03/2016  MRI abdomen 03/08/2017-ablation defect in the right liver without residual/recurrent disease  MRI liver 06/11/2017-multiple new enhancing lesions in the right hepatic lobe surrounding the ablation defect consistent with progressive cholangiocarcinoma  Y-90 radioembolization of the right liver 07/19/2017  MRI abdomen 10/09/2017-suspected progression of intrahepatic larger carcinoma within segment 6, reviewed by interventional radiology most consistent with pseudo-progression  MRI abdomen 01/08/2018-slight decrease in the segment 6 mass, no change in enhancement at the ablation bed, new tiny arterial phase area of hyperenhancement in the lateral left liver-potentially a vascular phenomena  MRI abdomen 04/03/2018-stable segment 6 mass, no change in 10 mm flash filling segment 3 lesion-potentially a vascular shunt, a porta hepatis node measures 2.2 cm compared to 1.5 cm, no new lymphadenopathy  MRI abdomen 07/03/2018 stable segment 6 liver mass, stable segment 3 arterial phase enhancing lesion stable porta hepatis adenopathy, stable pancreas cystic lesion  MRI abdomen 11/20/2018-new 2.1 cm lateral left liver lesion, new 8 mm medial left liver lesion, no change in posterior right hepatic mass, no change in necrotic hepato-duodenal ligament lymph node no change in cystic pancreas lesion  Y 90 of the left liver 01/16/2019  Cycle 1 gemcitabine/cisplatin 03/12/2019  Cycle 2 gemcitabine/cisplatin 04/09/2019 (doses reduced due to neutropenia)  Cycle 3 gemcitabine/cisplatin 05/07/2019  Cycle 4 gemcitabine/cisplatin 05/28/2019-Neulasta not given  Cycle 5 gemcitabine/cisplatin 06/18/2019  MRI abdomen 07/08/2019-progressive liver  metastases  Ivosidenib 07/19/2019 2. Left leg and right foot pain  Negative left leg Doppler 07/14/2016   3. Right abdominal pain-potentially related to the dominant right liver mass  4. Anorexia/weight loss  5. Benign appearing 16 mm cystic uncinatepancreas lesion noted on MRI 06/20/2016   Slightly larger on an MRI 03/08/2017, felt to most likely represent a side branch intraductal papillary mucinous neoplasm  Stable on MRI 10/09/2017  Stable on MRI 01/08/2018  Stable on MRI 04/03/2018  Stable on MRI 07/03/2018  Stable on MRI 11/20/2018  6. Anemia-iron deficiency  7. Port-A-Cath placement interventional radiology 03/04/2019  8.Neutropenia secondary to chemotherapy-gemcitabineand cisplatindose reducedbeginning with cycle 2; mild neutropenia and thrombocytopenia 04/22/2019. Gemcitabine and cisplatin held. Treatment changed to every 3 weeks beginning on 05/14/2019.    Disposition: [name] appears stable.  She is tolerating the Ivosidenib well.  She will continue the current therapy.  She will undergo a restaging MRI for an office visit in 1 month.  [name] received an influenza vaccine today.  Laura Coder, MD  10/01/2019  9:37 AM

## 2019-10-02 ENCOUNTER — Telehealth: Payer: Self-pay | Admitting: Oncology

## 2019-10-02 NOTE — Telephone Encounter (Signed)
Scheduled per los. Called and spoke with patient. Confirmed appt 

## 2019-10-03 ENCOUNTER — Other Ambulatory Visit: Payer: Self-pay | Admitting: *Deleted

## 2019-10-03 DIAGNOSIS — C221 Intrahepatic bile duct carcinoma: Secondary | ICD-10-CM

## 2019-10-03 MED ORDER — CLONAZEPAM 0.5 MG PO TABS: 0.7500 mg | ORAL_TABLET | Freq: Two times a day (BID) | ORAL | 0 refills | Status: DC | PRN

## 2019-10-08 ENCOUNTER — Other Ambulatory Visit: Payer: Self-pay | Admitting: Nurse Practitioner

## 2019-10-08 DIAGNOSIS — C221 Intrahepatic bile duct carcinoma: Secondary | ICD-10-CM

## 2019-10-10 ENCOUNTER — Other Ambulatory Visit: Payer: Self-pay | Admitting: *Deleted

## 2019-10-10 DIAGNOSIS — C221 Intrahepatic bile duct carcinoma: Secondary | ICD-10-CM

## 2019-10-10 MED ORDER — TIBSOVO 250 MG PO TABS: 2.0000 | ORAL_TABLET | Freq: Every day | ORAL | 0 refills | Status: DC

## 2019-10-16 ENCOUNTER — Telehealth: Payer: Self-pay

## 2019-10-16 ENCOUNTER — Other Ambulatory Visit: Payer: Self-pay | Admitting: Pharmacist

## 2019-10-16 DIAGNOSIS — C221 Intrahepatic bile duct carcinoma: Secondary | ICD-10-CM

## 2019-10-16 MED ORDER — TIBSOVO 250 MG PO TABS: 2.0000 | ORAL_TABLET | Freq: Every day | ORAL | 0 refills | Status: DC

## 2019-10-16 NOTE — Telephone Encounter (Signed)
TC from Pt inquiring about refill on Tibsovo Pt stated that she had not received her medication yet and she has enough pills until Saturday. Upon looking in Pt's chart Evelena Peat from Pharmacy already sent Pt's prescription to her pharmacy to be refilled. Pt informed, verbalized understanding.

## 2019-10-16 NOTE — Telephone Encounter (Signed)
Oral Oncology Patient Advocate Encounter  I received a call from [facility] stating they needed a new script for Tibsovo.  [facility] has switched pharmacies from [facility] to [facility]. I sent a message to the nurse and pharmacist requesting a new rx be sent to [facility].  I called the patient and gave her this information. She was very appreciative as she will be running out of medicine on Saturday.  The patient knows to call the office with questions or concerns.  [place] Patient [place] Phone [phone] Fax [phone] 10/16/2019   9:19 AM

## 2019-10-27 ENCOUNTER — Ambulatory Visit (HOSPITAL_COMMUNITY)
Admission: RE | Admit: 2019-10-27 | Discharge: 2019-10-27 | Disposition: A | Discharge status: Home / Self Care | Payer: PPO | Source: Ambulatory Visit | Attending: Oncology | Admitting: Oncology

## 2019-10-27 ENCOUNTER — Other Ambulatory Visit: Payer: Self-pay

## 2019-10-27 DIAGNOSIS — C221 Intrahepatic bile duct carcinoma: Secondary | ICD-10-CM | POA: Diagnosis not present

## 2019-10-27 DIAGNOSIS — Z23 Encounter for immunization: Secondary | ICD-10-CM | POA: Diagnosis not present

## 2019-10-27 MED ORDER — GADOBUTROL 1 MMOL/ML IV SOLN
6.5000 mL | Freq: Once | INTRAVENOUS | Status: AC | PRN
  Administered 2019-10-27: 08:00:00 6.5 mL via INTRAVENOUS

## 2019-10-29 ENCOUNTER — Inpatient Hospital Stay: Payer: PPO | Attending: Oncology

## 2019-10-29 ENCOUNTER — Telehealth: Payer: Self-pay

## 2019-10-29 ENCOUNTER — Inpatient Hospital Stay (HOSPITAL_BASED_OUTPATIENT_CLINIC_OR_DEPARTMENT_OTHER): Payer: PPO | Admitting: Oncology

## 2019-10-29 ENCOUNTER — Other Ambulatory Visit: Payer: Self-pay

## 2019-10-29 ENCOUNTER — Other Ambulatory Visit: Payer: PPO

## 2019-10-29 ENCOUNTER — Ambulatory Visit: Payer: PPO | Admitting: Nurse Practitioner

## 2019-10-29 VITALS — BP 121/61 | HR 75 | Temp 98.8°F | Resp 17 | Ht 65.0 in | Wt 133.6 lb

## 2019-10-29 DIAGNOSIS — D509 Iron deficiency anemia, unspecified: Secondary | ICD-10-CM | POA: Insufficient documentation

## 2019-10-29 DIAGNOSIS — R630 Anorexia: Secondary | ICD-10-CM | POA: Diagnosis not present

## 2019-10-29 DIAGNOSIS — C221 Intrahepatic bile duct carcinoma: Secondary | ICD-10-CM

## 2019-10-29 DIAGNOSIS — Z79899 Other long term (current) drug therapy: Secondary | ICD-10-CM | POA: Insufficient documentation

## 2019-10-29 DIAGNOSIS — R110 Nausea: Secondary | ICD-10-CM | POA: Insufficient documentation

## 2019-10-29 DIAGNOSIS — Z23 Encounter for immunization: Secondary | ICD-10-CM

## 2019-10-29 LAB — CBC WITH DIFFERENTIAL (CANCER CENTER ONLY)
Abs Immature Granulocytes: 0.01 10*3/uL (ref 0.00–0.07)
Basophils Absolute: 0 10*3/uL (ref 0.0–0.1)
Basophils Relative: 1 %
Eosinophils Absolute: 0 10*3/uL (ref 0.0–0.5)
Eosinophils Relative: 1 %
HCT: 37.7 % (ref 36.0–46.0)
Hemoglobin: 12.8 g/dL (ref 12.0–15.0)
Immature Granulocytes: 0 %
Lymphocytes Relative: 24 %
Lymphs Abs: 0.6 10*3/uL — ABNORMAL LOW (ref 0.7–4.0)
MCH: 32.2 pg (ref 26.0–34.0)
MCHC: 34 g/dL (ref 30.0–36.0)
MCV: 95 fL (ref 80.0–100.0)
Monocytes Absolute: 0.3 10*3/uL (ref 0.1–1.0)
Monocytes Relative: 12 %
Neutro Abs: 1.6 10*3/uL — ABNORMAL LOW (ref 1.7–7.7)
Neutrophils Relative %: 62 %
Platelet Count: 172 10*3/uL (ref 150–400)
RBC: 3.97 MIL/uL (ref 3.87–5.11)
RDW: 12.9 % (ref 11.5–15.5)
WBC Count: 2.6 10*3/uL — ABNORMAL LOW (ref 4.0–10.5)
nRBC: 0 % (ref 0.0–0.2)

## 2019-10-29 LAB — PHOSPHORUS: Phosphorus: 3.9 mg/dL (ref 2.5–4.6)

## 2019-10-29 LAB — CK: Total CK: 73 U/L (ref 38–234)

## 2019-10-29 LAB — CMP (CANCER CENTER ONLY)
ALT: 10 U/L (ref 0–44)
AST: 17 U/L (ref 15–41)
Albumin: 4.2 g/dL (ref 3.5–5.0)
Alkaline Phosphatase: 115 U/L (ref 38–126)
Anion gap: 9 (ref 5–15)
BUN: 12 mg/dL (ref 8–23)
CO2: 29 mmol/L (ref 22–32)
Calcium: 9.8 mg/dL (ref 8.9–10.3)
Chloride: 103 mmol/L (ref 98–111)
Creatinine: 0.76 mg/dL (ref 0.44–1.00)
GFR, Est AFR Am: >60 mL/min (ref >60)
GFR, Estimated: >60 mL/min (ref >60)
Glucose, Bld: 96 mg/dL (ref 70–99)
Potassium: 3.6 mmol/L (ref 3.5–5.1)
Sodium: 141 mmol/L (ref 135–145)
Total Bilirubin: 0.7 mg/dL (ref 0.3–1.2)
Total Protein: 7.1 g/dL (ref 6.5–8.1)

## 2019-10-29 NOTE — Telephone Encounter (Signed)
Oral Oncology Patient Advocate Encounter  Tibsovo patient assistance will expire 12/25/19.  I have completed a renewal application with the patients signature and income document.  The application will be submitted once the renewal period starts.  [facility] Patient [place] Phone [phone] Fax [phone] 10/29/2019   12:56 PM

## 2019-10-29 NOTE — Progress Notes (Signed)
[facility] OFFICE PROGRESS NOTE   Diagnosis: Cholangiocarcinoma  INTERVAL HISTORY:   [name] returns as scheduled.  She continues ivosidenib.  She has nausea in the morning, relieved with Compazine.  She reports poor appetite.  No rash or diarrhea.    Objective:  Vital signs in last 24 hours:  Blood pressure 121/61, pulse 75, temperature 98.8 F (37.1 C), temperature source Oral, resp. rate 17, height '5\' 5"'  (1.651 m), weight 133 lb 9.6 oz (60.6 kg), SpO2 99 %.     Lymphatics: No cervical or supraclavicular nodes Resp: Scattered end inspiratory wheeze, no respiratory distress Cardio: Regular rate and rhythm GI: No hepatosplenomegaly, no mass, mild tenderness throughout the right abdomen Vascular: No leg edema  Skin: No rash  Portacath/PICC-without erythema  Lab Results:  Lab Results  Component Value Date   WBC 2.6 (L) 10/29/2019   HGB 12.8 10/29/2019   HCT 37.7 10/29/2019   MCV 95.0 10/29/2019   PLT 172 10/29/2019   NEUTROABS 1.6 (L) 10/29/2019    CMP  Lab Results  Component Value Date   NA 141 10/29/2019   K 3.6 10/29/2019   CL 103 10/29/2019   CO2 29 10/29/2019   GLUCOSE 96 10/29/2019   BUN 12 10/29/2019   CREATININE 0.76 10/29/2019   CALCIUM 9.8 10/29/2019   PROT 7.1 10/29/2019   ALBUMIN 4.2 10/29/2019   AST 17 10/29/2019   ALT 10 10/29/2019   ALKPHOS 115 10/29/2019   BILITOT 0.7 10/29/2019   GFRNONAA >60 10/29/2019   GFRAA >60 10/29/2019    Lab Results  Component Value Date   CEA1 <1.00 02/21/2019    Lab Results  Component Value Date   INR 0.9 03/04/2019    Imaging:  Mr Abdomen W Wo Contrast  Result Date: 10/27/2019 CLINICAL DATA:  Restaging cholangiocarcinoma, prior Y-90 EXAM: MRI ABDOMEN WITHOUT AND WITH CONTRAST TECHNIQUE: Multiplanar multisequence MR imaging of the abdomen was performed both before and after the administration of intravenous contrast. CONTRAST:  6.36m GADAVIST GADOBUTROL 1 MMOL/ML IV SOLN  COMPARISON:  07/08/2019 FINDINGS: Motion degraded images. Lower chest: Lung bases are clear. Hepatobiliary: Dominant 1.9 x 4.9 cm lesion along the posterior aspect of segment 6 (series 1003/image 41), previously 3.0 x 5.2 cm. Associated 1.9 cm enhancing focus along the left medial aspect of the lesion, previously 2.1 cm. Medially superiorly is a 2.2 x 3.0 cm enhancing lesion in segment 6 (series 1003/image 37), previously 1.8 x 3.0 cm. Two enhancing lesions in the central aspect of segment 4, both measuring 1.6 cm (series 1003/images 15 and 18), previously 1.1 and 1.6 cm. Adjacent 10 mm lesion medially (series 1003/image 22) may have been present on the prior but is certainly increased in size/more conspicuous. 1.4 cm rim enhancing lesion in segment 2 (series 1003/image 9), previously 1.6 cm. Additional scattered subcapsular lesions in the right liver. Overall, particularly when accounting for various phases of enhancement as well as motion degradation, the appearance is generally unchanged from the prior. Status post cholecystectomy. No intrahepatic or extrahepatic duct dilatation. Pancreas: 2.5 cm cystic lesion along the anterior aspect of the pancreatic head (series 3/image 38), previously 2.6 cm, grossly unchanged. No pancreatic atrophy or ductal dilatation. Spleen: Within normal limits. Small splenules in the left upper abdomen anterior to the spleen. Adrenals/Urinary Tract:  Adrenal glands are within normal limits. Left renal sinus cysts and subcentimeter upper pole cyst. Right kidney is within normal limits. No hydronephrosis. Stomach/Bowel: Stomach is within normal limits. Visualized bowel is unremarkable.  Vascular/Lymphatic:  No evidence of abdominal aortic aneurysm. 2.3 cm short axis node in the porta hepatis (series 3/image 24), previously 2.6 cm, possibly mildly improved. Other:  No abdominal ascites. Musculoskeletal: No focal osseous lesions. IMPRESSION: Motion degraded images. Multifocal hepatic  lesions related to known cholangiocarcinoma, overall grossly unchanged when compared to the prior. 2.3 cm short axis nodal metastasis in the porta hepatis, possibly mildly improved. Additional ancillary findings as above. Electronically Signed   By: Julian Hy M.D.   On: 10/27/2019 08:24    Medications: I have reviewed the patient's current medications.   Assessment/Plan: 1. Right liver mass-biopsy 07/10/2016 confirmed adenocarcinoma, PDL 1 0%, MSS, tumor mutation burden 3, IDH1 and PIK3 CA alterations  CT abdomen/pelvis 06/17/2016 and MRI abdomen 06/21/2016 confirmed an isolated right liver mass and a cystic pancreas lesion  PET scan 07/28/2016-the hepatic lesion is not hypermetabolic, hypermetabolic lesion in the gastric antrum suspicious for a primary neoplasm and no other evidence of metastatic disease  Upper endoscopy 08/03/2016-no mass found, biopsy from the gastric antrum-benign  CT-guided ablation of the right liver lesion 11/03/2016  MRI abdomen 03/08/2017-ablation defect in the right liver without residual/recurrent disease  MRI liver 06/11/2017-multiple new enhancing lesions in the right hepatic lobe surrounding the ablation defect consistent with progressive cholangiocarcinoma  Y-90 radioembolization of the right liver 07/19/2017  MRI abdomen 10/09/2017-suspected progression of intrahepatic larger carcinoma within segment 6, reviewed by interventional radiology most consistent with pseudo-progression  MRI abdomen 01/08/2018-slight decrease in the segment 6 mass, no change in enhancement at the ablation bed, new tiny arterial phase area of hyperenhancement in the lateral left liver-potentially a vascular phenomena  MRI abdomen 04/03/2018-stable segment 6 mass, no change in 10 mm flash filling segment 3 lesion-potentially a vascular shunt, a porta hepatis node measures 2.2 cm compared to 1.5 cm, no new lymphadenopathy  MRI abdomen 07/03/2018 stable segment 6 liver mass,  stable segment 3 arterial phase enhancing lesion stable porta hepatis adenopathy, stable pancreas cystic lesion  MRI abdomen 11/20/2018-new 2.1 cm lateral left liver lesion, new 8 mm medial left liver lesion, no change in posterior right hepatic mass, no change in necrotic hepato-duodenal ligament lymph node no change in cystic pancreas lesion  Y 90 of the left liver 01/16/2019  Cycle 1 gemcitabine/cisplatin 03/12/2019  Cycle 2 gemcitabine/cisplatin 04/09/2019 (doses reduced due to neutropenia)  Cycle 3 gemcitabine/cisplatin 05/07/2019  Cycle 4 gemcitabine/cisplatin 05/28/2019-Neulasta not given  Cycle 5 gemcitabine/cisplatin 06/18/2019  MRI abdomen 07/08/2019-progressive liver metastases  Ivosidenib 07/19/2019  MRI abdomen 10/27/2019-multiple hepatic lesions-grossly unchanged, stable to mildly improved porta hepatis node 2. Left leg and right foot pain  Negative left leg Doppler 07/14/2016   3. Right abdominal pain-potentially related to the dominant right liver mass  4. Anorexia/weight loss  5. Benign appearing 16 mm cystic uncinatepancreas lesion noted on MRI 06/20/2016   Slightly larger on an MRI 03/08/2017, felt to most likely represent a side branch intraductal papillary mucinous neoplasm  Stable on MRI 10/09/2017  Stable on MRI 01/08/2018  Stable on MRI 04/03/2018  Stable on MRI 07/03/2018  Stable on MRI 11/20/2018  Stable on MRI 01/2019  6. Anemia-iron deficiency  7. Port-A-Cath placement interventional radiology 03/04/2019  8.Neutropenia secondary to chemotherapy-gemcitabineand cisplatindose reducedbeginning with cycle 2; mild neutropenia and thrombocytopenia 04/22/2019. Gemcitabine and cisplatin held. Treatment changed to every 3 weeks beginning on 05/14/2019.  Disposition: Laura [name] appears unchanged.  She is tolerating the ivosidenib well.  The restaging MRI is consistent with stable disease.  She reports a poor  appetite, predating the  current treatment.  Her weight is stable.  I reviewed the MRI images with Ms. Deyarmin.  The plan is to continue the current therapy.  She will return for an office and lab visit in 1 month.  We will check an EKG when she is here next month.  I will follow-up on the chemistry panel from today.  [name], MD  10/29/2019  10:02 AM

## 2019-10-31 ENCOUNTER — Other Ambulatory Visit: Payer: Self-pay | Admitting: Oncology

## 2019-10-31 DIAGNOSIS — C221 Intrahepatic bile duct carcinoma: Secondary | ICD-10-CM

## 2019-11-10 ENCOUNTER — Other Ambulatory Visit: Payer: Self-pay | Admitting: Nurse Practitioner

## 2019-11-10 DIAGNOSIS — Z1231 Encounter for screening mammogram for malignant neoplasm of breast: Secondary | ICD-10-CM | POA: Diagnosis not present

## 2019-11-10 DIAGNOSIS — C221 Intrahepatic bile duct carcinoma: Secondary | ICD-10-CM

## 2019-11-11 ENCOUNTER — Telehealth: Payer: Self-pay | Admitting: *Deleted

## 2019-11-11 ENCOUNTER — Telehealth: Payer: Self-pay | Admitting: Oncology

## 2019-11-11 NOTE — Telephone Encounter (Signed)
Returned patient's phone call regarding voicemail that was left, per patient's request 12/01 appointments have moved to 12/04.

## 2019-11-11 NOTE — Telephone Encounter (Signed)
Called to request refill on her Tibsovo. Called pharmacy and confirmed they have the refill on file that was sent on 10/31/19 with one refill. She is on list to be called today to arrange shipment. Called patient back and informed her.

## 2019-11-25 ENCOUNTER — Ambulatory Visit: Payer: PPO | Admitting: Nurse Practitioner

## 2019-11-25 ENCOUNTER — Other Ambulatory Visit: Payer: PPO

## 2019-11-28 ENCOUNTER — Telehealth: Payer: Self-pay | Admitting: Oncology

## 2019-11-28 ENCOUNTER — Inpatient Hospital Stay (HOSPITAL_BASED_OUTPATIENT_CLINIC_OR_DEPARTMENT_OTHER): Payer: PPO | Admitting: Nurse Practitioner

## 2019-11-28 ENCOUNTER — Encounter: Payer: Self-pay | Admitting: Nurse Practitioner

## 2019-11-28 ENCOUNTER — Other Ambulatory Visit: Payer: Self-pay

## 2019-11-28 ENCOUNTER — Inpatient Hospital Stay: Payer: PPO | Attending: Oncology

## 2019-11-28 ENCOUNTER — Inpatient Hospital Stay: Payer: PPO

## 2019-11-28 VITALS — BP 157/66 | HR 83 | Temp 97.8°F | Resp 16 | Ht 65.0 in | Wt 135.6 lb

## 2019-11-28 DIAGNOSIS — E876 Hypokalemia: Secondary | ICD-10-CM | POA: Diagnosis not present

## 2019-11-28 DIAGNOSIS — Z9221 Personal history of antineoplastic chemotherapy: Secondary | ICD-10-CM | POA: Diagnosis not present

## 2019-11-28 DIAGNOSIS — Z452 Encounter for adjustment and management of vascular access device: Secondary | ICD-10-CM | POA: Insufficient documentation

## 2019-11-28 DIAGNOSIS — C221 Intrahepatic bile duct carcinoma: Secondary | ICD-10-CM | POA: Insufficient documentation

## 2019-11-28 DIAGNOSIS — D509 Iron deficiency anemia, unspecified: Secondary | ICD-10-CM | POA: Diagnosis not present

## 2019-11-28 DIAGNOSIS — R9431 Abnormal electrocardiogram [ECG] [EKG]: Secondary | ICD-10-CM | POA: Diagnosis not present

## 2019-11-28 DIAGNOSIS — Z79899 Other long term (current) drug therapy: Secondary | ICD-10-CM | POA: Insufficient documentation

## 2019-11-28 DIAGNOSIS — Z95828 Presence of other vascular implants and grafts: Secondary | ICD-10-CM

## 2019-11-28 LAB — CMP (CANCER CENTER ONLY)
ALT: 11 U/L (ref 0–44)
AST: 17 U/L (ref 15–41)
Albumin: 4 g/dL (ref 3.5–5.0)
Alkaline Phosphatase: 118 U/L (ref 38–126)
Anion gap: 8 (ref 5–15)
BUN: 10 mg/dL (ref 8–23)
CO2: 29 mmol/L (ref 22–32)
Calcium: 9.7 mg/dL (ref 8.9–10.3)
Chloride: 104 mmol/L (ref 98–111)
Creatinine: 0.72 mg/dL (ref 0.44–1.00)
GFR, Est AFR Am: >60 mL/min (ref >60)
GFR, Estimated: >60 mL/min (ref >60)
Glucose, Bld: 98 mg/dL (ref 70–99)
Potassium: 3.6 mmol/L (ref 3.5–5.1)
Sodium: 141 mmol/L (ref 135–145)
Total Bilirubin: 0.5 mg/dL (ref 0.3–1.2)
Total Protein: 6.7 g/dL (ref 6.5–8.1)

## 2019-11-28 LAB — CBC WITH DIFFERENTIAL (CANCER CENTER ONLY)
Abs Immature Granulocytes: 0.01 10*3/uL (ref 0.00–0.07)
Basophils Absolute: 0 10*3/uL (ref 0.0–0.1)
Basophils Relative: 1 %
Eosinophils Absolute: 0 10*3/uL (ref 0.0–0.5)
Eosinophils Relative: 1 %
HCT: 36.7 % (ref 36.0–46.0)
Hemoglobin: 12.6 g/dL (ref 12.0–15.0)
Immature Granulocytes: 0 %
Lymphocytes Relative: 21 %
Lymphs Abs: 0.7 10*3/uL (ref 0.7–4.0)
MCH: 33.3 pg (ref 26.0–34.0)
MCHC: 34.3 g/dL (ref 30.0–36.0)
MCV: 97.1 fL (ref 80.0–100.0)
Monocytes Absolute: 0.4 10*3/uL (ref 0.1–1.0)
Monocytes Relative: 13 %
Neutro Abs: 2 10*3/uL (ref 1.7–7.7)
Neutrophils Relative %: 64 %
Platelet Count: 178 10*3/uL (ref 150–400)
RBC: 3.78 MIL/uL — ABNORMAL LOW (ref 3.87–5.11)
RDW: 12.6 % (ref 11.5–15.5)
WBC Count: 3.2 10*3/uL — ABNORMAL LOW (ref 4.0–10.5)
nRBC: 0 % (ref 0.0–0.2)

## 2019-11-28 LAB — MAGNESIUM: Magnesium: 2.1 mg/dL (ref 1.7–2.4)

## 2019-11-28 LAB — CK: Total CK: 67 U/L (ref 38–234)

## 2019-11-28 MED ORDER — SODIUM CHLORIDE 0.9% FLUSH
10.0000 mL | Freq: Once | INTRAVENOUS | Status: AC
  Administered 2019-11-28: 10 mL
  Filled 2019-11-28: qty 10

## 2019-11-28 MED ORDER — HEPARIN SOD (PORK) LOCK FLUSH 100 UNIT/ML IV SOLN
500.0000 [IU] | Freq: Once | INTRAVENOUS | Status: AC
  Administered 2019-11-28: 500 [IU]
  Filled 2019-11-28: qty 5

## 2019-11-28 NOTE — Telephone Encounter (Signed)
Confirmed December appointments with patient.

## 2019-11-28 NOTE — Telephone Encounter (Signed)
Gave avs and calendar ° °

## 2019-11-28 NOTE — Progress Notes (Signed)
[facility] OFFICE PROGRESS NOTE   Diagnosis: Cholangiocarcinoma  INTERVAL HISTORY:   Laura [name] returns as scheduled.  She continues Ivosidenib.  She denies nausea/vomiting.  No mouth sores.  No diarrhea.  No rash.  No fever, cough, shortness of breath.  No chest pain.  Appetite varies.  Weight is stable.  She notes that she cries periodically.  She is not interested in an antidepressant.  Objective:  Vital signs in last 24 hours:  Blood pressure (!) 157/66, pulse 83, temperature 97.8 F (36.6 C), temperature source Temporal, resp. rate 16, height 5' 5" (1.651 m), weight 135 lb 9.6 oz (61.5 kg), SpO2 99 %.    HEENT: No thrush or ulcers. GI: Abdomen soft; mild tenderness right abdomen.  No hepatomegaly.  No mass. Vascular: No leg edema.  Calves soft and nontender. Neuro: Alert and oriented. Skin: No rash. Port-A-Cath without erythema.   Lab Results:  Lab Results  Component Value Date   WBC 3.2 (L) 11/28/2019   HGB 12.6 11/28/2019   HCT 36.7 11/28/2019   MCV 97.1 11/28/2019   PLT 178 11/28/2019   NEUTROABS 2.0 11/28/2019    Imaging:  No results found.  Medications: I have reviewed the patient's current medications.  Assessment/Plan: 1. Right liver mass-biopsy 07/10/2016 confirmed adenocarcinoma, PDL 1 0%, MSS, tumor mutation burden 3, IDH1 and PIK3 CA alterations  CT abdomen/pelvis 06/17/2016 and MRI abdomen 06/21/2016 confirmed an isolated right liver mass and a cystic pancreas lesion  PET scan 07/28/2016-the hepatic lesion is not hypermetabolic, hypermetabolic lesion in the gastric antrum suspicious for a primary neoplasm and no other evidence of metastatic disease  Upper endoscopy 08/03/2016-no mass found, biopsy from the gastric antrum-benign  CT-guided ablation of the right liver lesion 11/03/2016  MRI abdomen 03/08/2017-ablation defect in the right liver without residual/recurrent disease  MRI liver 06/11/2017-multiple new enhancing  lesions in the right hepatic lobe surrounding the ablation defect consistent with progressive cholangiocarcinoma  Y-90 radioembolization of the right liver 07/19/2017  MRI abdomen 10/09/2017-suspected progression of intrahepatic larger carcinoma within segment 6, reviewed by interventional radiology most consistent with pseudo-progression  MRI abdomen 01/08/2018-slight decrease in the segment 6 mass, no change in enhancement at the ablation bed, new tiny arterial phase area of hyperenhancement in the lateral left liver-potentially a vascular phenomena  MRI abdomen 04/03/2018-stable segment 6 mass, no change in 10 mm flash filling segment 3 lesion-potentially a vascular shunt, a porta hepatis node measures 2.2 cm compared to 1.5 cm, no new lymphadenopathy  MRI abdomen 07/03/2018 stable segment 6 liver mass, stable segment 3 arterial phase enhancing lesion stable porta hepatis adenopathy, stable pancreas cystic lesion  MRI abdomen 11/20/2018-new 2.1 cm lateral left liver lesion, new 8 mm medial left liver lesion, no change in posterior right hepatic mass, no change in necrotic hepato-duodenal ligament lymph node no change in cystic pancreas lesion  Y 90 of the left liver 01/16/2019  Cycle 1 gemcitabine/cisplatin 03/12/2019  Cycle 2 gemcitabine/cisplatin 04/09/2019 (doses reduced due to neutropenia)  Cycle 3 gemcitabine/cisplatin 05/07/2019  Cycle 4 gemcitabine/cisplatin 05/28/2019-Neulasta not given  Cycle 5 gemcitabine/cisplatin 06/18/2019  MRI abdomen 07/08/2019-progressive liver metastases  Ivosidenib 07/19/2019  MRI abdomen 10/27/2019-multiple hepatic lesions-grossly unchanged, stable to mildly improved porta hepatis node  Ivosidenib placed on hold 11/28/2019 due to QTc interval 488 2. Left leg and right foot pain  Negative left leg Doppler 07/14/2016   3. Right abdominal pain-potentially related to the dominant right liver mass  4. Anorexia/weight loss  5. Benign  appearing  16 mm cystic uncinatepancreas lesion noted on MRI 06/20/2016   Slightly larger on an MRI 03/08/2017, felt to most likely represent a side branch intraductal papillary mucinous neoplasm  Stable on MRI 10/09/2017  Stable on MRI 01/08/2018  Stable on MRI 04/03/2018  Stable on MRI 07/03/2018  Stable on MRI 11/20/2018  Stable on MRI 01/2019  6. Anemia-iron deficiency  7. Port-A-Cath placement interventional radiology 03/04/2019  8.Neutropenia secondary to chemotherapy-gemcitabineand cisplatindose reducedbeginning with cycle 2; mild neutropenia and thrombocytopenia 04/22/2019. Gemcitabine and cisplatin held. Treatment changed to every 3 weeks beginning on 05/14/2019.   Disposition: Laura [name] appears stable.  She continues Ivosidenib.  Overall she seems to be tolerating well.  We reviewed the CBC and chemistry panel from today, adequate to continue as above.  We will follow-up on the CK value.  She will have an EKG today.  She will return for lab, follow-up, EKG in 1 month.  She will contact the office in the interim with any problems.    Ned Card ANP/GNP-BC   11/28/2019  10:49 AM  Addendum: EKG showed QTc interval 488.  Laura [name] was instructed to hold Ivosidenib and return for a follow-up EKG in 1 week.

## 2019-12-04 ENCOUNTER — Telehealth: Payer: Self-pay

## 2019-12-04 NOTE — Telephone Encounter (Signed)
Faxed renewal application for Tibsovo to [employer] fax [phone]  [facility] Patient [place] Phone [phone]  Fax [phone]  12/04/2019 11:32 AM

## 2019-12-05 ENCOUNTER — Encounter: Payer: Self-pay | Admitting: *Deleted

## 2019-12-05 ENCOUNTER — Other Ambulatory Visit: Payer: Self-pay

## 2019-12-05 ENCOUNTER — Inpatient Hospital Stay: Payer: PPO | Admitting: *Deleted

## 2019-12-05 DIAGNOSIS — C221 Intrahepatic bile duct carcinoma: Secondary | ICD-10-CM | POA: Diagnosis not present

## 2019-12-05 DIAGNOSIS — C9000 Multiple myeloma not having achieved remission: Secondary | ICD-10-CM

## 2019-12-05 NOTE — Progress Notes (Signed)
EKG performed in office today with improvement in Qtc to 460. OK to resume full dose 500 mg daily of Ivosidenib. F/U with next EKG in 1 week. Patient notified.

## 2019-12-12 ENCOUNTER — Telehealth: Payer: Self-pay | Admitting: *Deleted

## 2019-12-12 ENCOUNTER — Inpatient Hospital Stay: Payer: PPO

## 2019-12-12 ENCOUNTER — Other Ambulatory Visit: Payer: Self-pay

## 2019-12-12 DIAGNOSIS — C221 Intrahepatic bile duct carcinoma: Secondary | ICD-10-CM | POA: Diagnosis not present

## 2019-12-12 DIAGNOSIS — C9000 Multiple myeloma not having achieved remission: Secondary | ICD-10-CM

## 2019-12-12 NOTE — Telephone Encounter (Addendum)
Notified patient that her ECG QTc of 467 is OK to continue taking her Ivosidenib at current dose per Ned Card, NP. Will need another ECG next week. Scheduling message sent.

## 2019-12-15 ENCOUNTER — Telehealth: Payer: Self-pay | Admitting: Oncology

## 2019-12-15 NOTE — Telephone Encounter (Signed)
Scheduled appt per 12/18 sch message - pt aware of appt date and time

## 2019-12-18 ENCOUNTER — Other Ambulatory Visit: Payer: Self-pay

## 2019-12-18 ENCOUNTER — Encounter: Payer: Self-pay | Admitting: *Deleted

## 2019-12-18 ENCOUNTER — Inpatient Hospital Stay: Payer: PPO

## 2019-12-18 DIAGNOSIS — C221 Intrahepatic bile duct carcinoma: Secondary | ICD-10-CM | POA: Diagnosis not present

## 2019-12-18 NOTE — Progress Notes (Signed)
EKG completed with QTc 461/463. OK to continue with ivosidenib as current dose. F/U on 12/28 as scheduled. Patient is aware.

## 2019-12-21 IMAGING — NM NM SPECIAL TREATMENT PROCEDURE
1 series · 6 of 6 positions shown · non-contrast
Comparison: MR 11/20/2018.

CLINICAL DATA: Unresectable cholangiocarcinoma. Second treatment
overall. First treatment to left lobe of liver.

EXAM:
NUCLEAR MEDICINE SPECIAL MED RAD PHYSICS CONS; NUCLEAR MEDICINE
RADIO PHARM THERAPY INTRA ARTERIAL; NUCLEAR MEDICINE TREATMENT
PROCEDURE; NUCLEAR MEDICINE LIVER SCAN
TECHNIQUE: In conjunction with the interventional radiologist a Y-90
Microsphere dose was calculated utilizing body surface area
formulation. Calculated dose equal 14.3 mCi. Pre therapy MAA liver
SPECT scan and CTA were evaluated. Utilizing a microcatheter system,
the hepatic artery was selected and Y-90 microspheres were delivered
in fractionated aliquots. Radiopharmaceutical was delivered by the
interventional radiologist and nuclear radiologist.
The patient tolerated procedure well. No adverse effects were noted.
Bremsstrahlung planar and SPECT imaging of the abdomen following
intrahepatic arterial delivery of Y-90 microsphere was performed.
RADIOPHARMACEUTICALS:  12.2 millicuriesQ-5R Microsphere dose was
delivered to the patient.

[Series 2: y-90 spect · 4.14mm/px · 6 of 64 frames shown]
[frame 6/64  full-range]
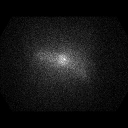
[frame 16/64  full-range]
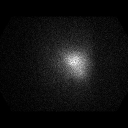
[frame 27/64  full-range]
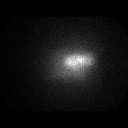
[frame 38/64  full-range]
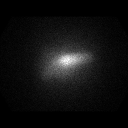
[frame 48/64  full-range]
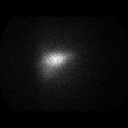
[frame 59/64  full-range]
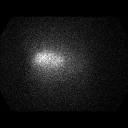

[6 of 6 positions shown; findings below may reference images not displayed]

FINDINGS: [AGE] microspheres therapy as above. First therapy the left hepatic
lobe.

Bremsstrahlung planar and SPECT imaging of the abdomen following
intrahepatic arterial delivery of 8-KUmicrosphere demonstrates
radioactivity localized to the left hepatic lobe. No evidence of
extrahepatic activity.
IMPRESSION: Successful [AGE] microsphere delivery for treatment of unresectable
liver metastasis. First therapy to the left lobe.

Bremssstrahlung scan demonstrates activity localized to left hepatic
lobe with no extrahepatic activity identified.

## 2019-12-21 IMAGING — XA IR ANGIO/VISCERAL SELECTIVE EA VESSEL WO/W FLUSH
8 series · 13 of 24 positions shown · IV contrast (IODINE)
Comparison: none

INDICATION: MULTIFOCAL CHOLANGIOCARCINOMA

[Series 1: care body 4 · 2 of 22 frames shown (1 of 6)]
[frame 4/22]
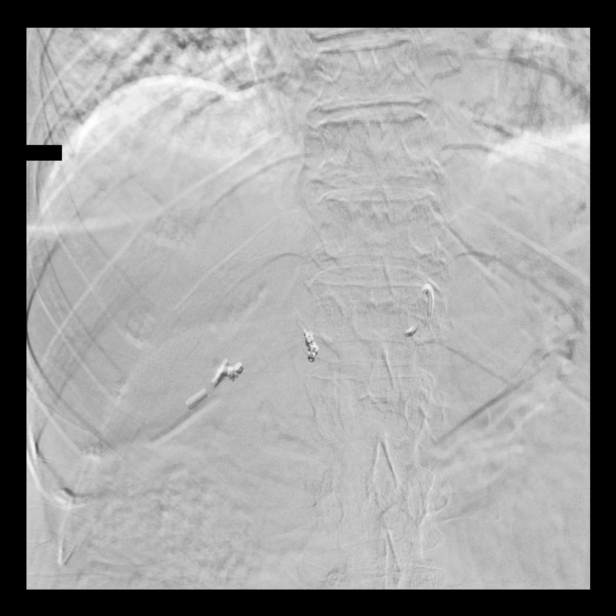
[frame 19/22]
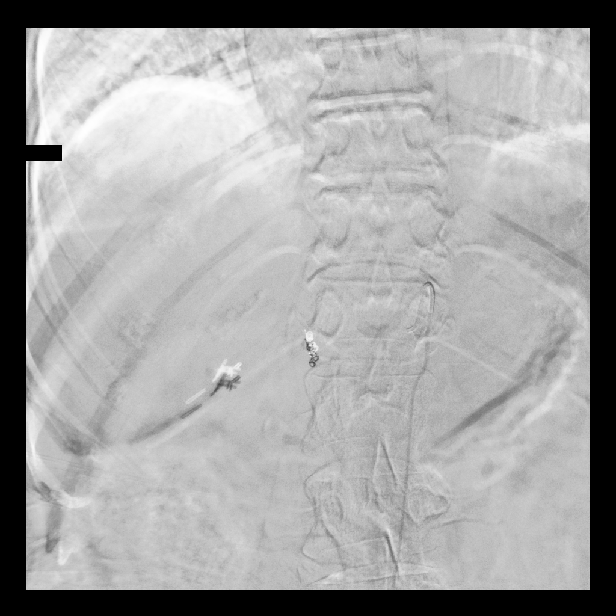

[Series 3: care body 4 · 1 of 27 frames shown (2 of 6)]
[frame 14/27]
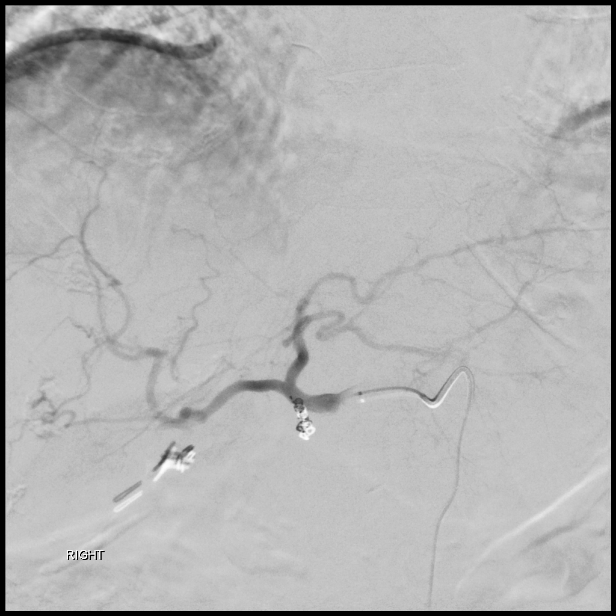

[Series 4: care body 4 · 2 of 37 frames shown (3 of 6)]
[frame 19/37]
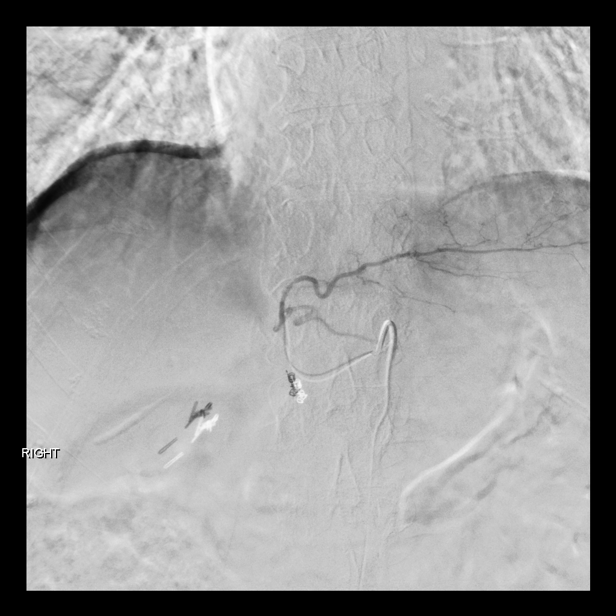
[frame 37/37]
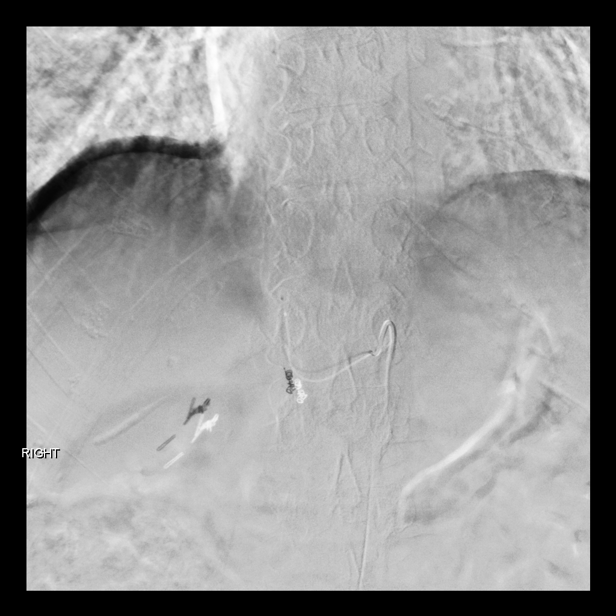

[Series 5: care body 4 · 1 of 39 frames shown (4 of 6)]
[frame 34/39]
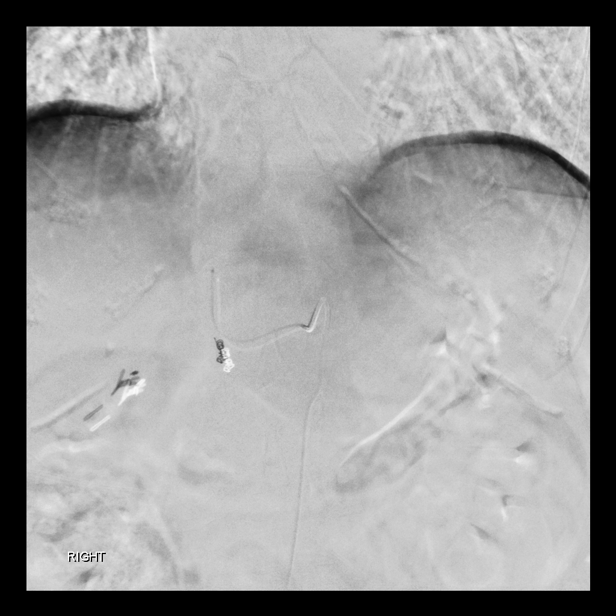

[Series 6: fl - angio · 2 of 44 frames shown]
[frame 23/44]
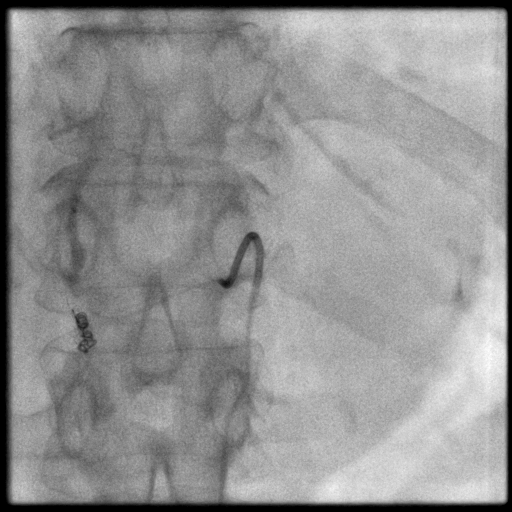
[frame 38/44]
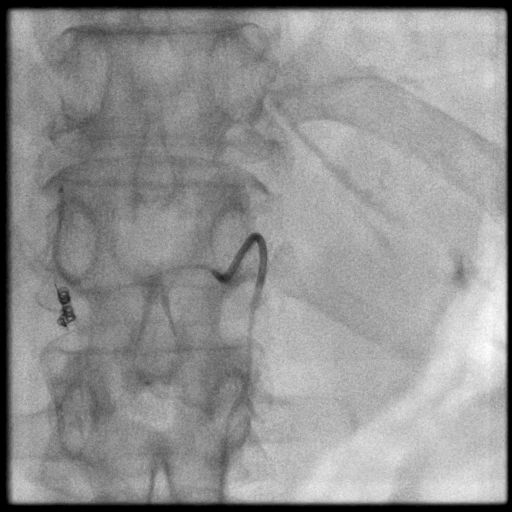

[Series 7: care body 4 · 1 of 18 frames shown (5 of 6)]
[frame 16/18]
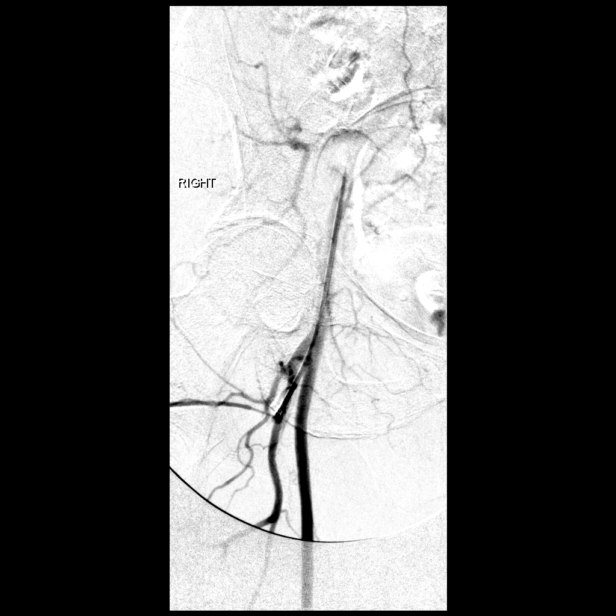

[Series 8: care body 4 · 1 of 19 frames shown (6 of 6)]
[frame 10/19]
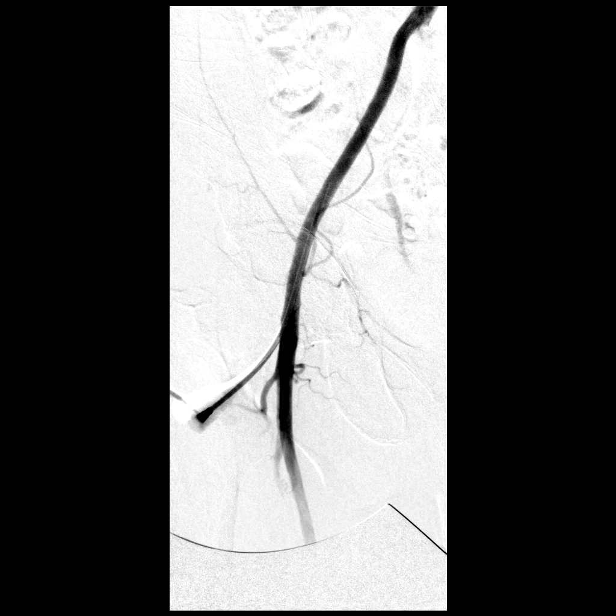

[Series 300: ld dsa body · 3 of 6 slices shown]
[im 1/6]
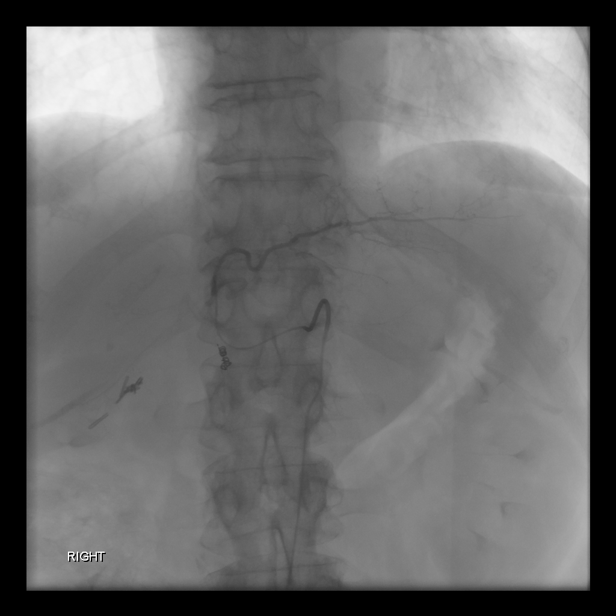
[im 3/6]
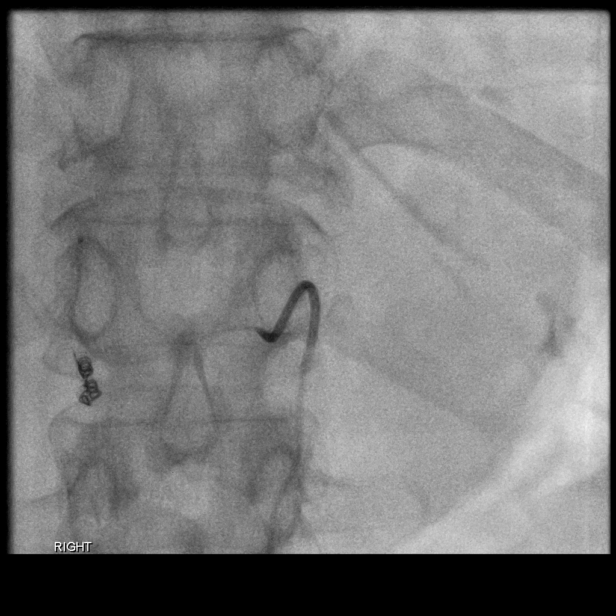
[im 6/6]
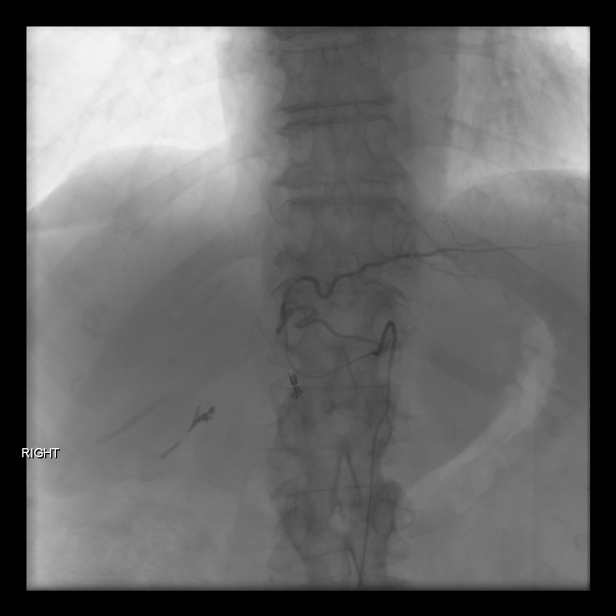

[13 of 24 positions shown; findings below may reference images not displayed]

EXAM:
Ultrasound guidance for vascular access

Celiac, common hepatic, and left hepatic catheterizations and
angiograms

Peripheral left hepatic [AGE] radio embolization

MEDICATIONS:
3.375 g Zosyn. The antibiotic was administered within 1 hour of the
procedure

40 mg Protonix, 8 mg Zofran

ANESTHESIA/SEDATION:
Moderate (conscious) sedation was employed during this procedure. A
total of Versed 5.0 mg and Fentanyl 200 mcg was administered
intravenously.

Moderate Sedation Time: 41 minutes. The patient's level of
consciousness and vital signs were monitored continuously by
radiology nursing throughout the procedure under my direct
supervision.

CONTRAST:  50 cc Isovue 300

FLUOROSCOPY TIME:  Fluoroscopy Time: 5 minutes 12 seconds (130 mGy).

COMPLICATIONS:
None immediate.



Under sterile conditions and local anesthesia, ultrasound
micropuncture access performed of the patent right common femoral
artery. Ultrasound images obtained for documentation. Five French
sheath inserted. Mickelson catheter reformed in the aorta and
utilized to select the celiac artery.

Celiac angiogram: The celiac origin including its branches remains
widely patent. The splenic, left gastric, hepatic vasculature
patent. Previous coil embolization of the right gastric artery.
Retrograde flow again noted in the gastroduodenal artery.

Renegade STC microcatheter advanced to the common hepatic artery.
Common hepatic angiogram performed.

Common hepatic: The common, proper, left and right hepatic arteries
all remain patent. With delayed imaging, there is tumor blushing in
the left hepatic lobe laterally as before.

Microcatheter advanced over a double angled glidewire into the left
hepatic artery. Left hepatic angiogram performed.

Left hepatic: The left hepatic artery including its branches remain
patent. With delayed imaging, there is tumor blushing within the
left hepatic vasculature which correlates with the lesion by MRI.

Left hepatic [AGE] radio embolization: From this location, the entire
dose of [AGE] was instilled slowly with intermittent fluoroscopy into
the left hepatic lobe. Postprocedure imaging demonstrates preserved
antegrade flow in the left hepatic vasculature.

Access removed. Hemostasis obtained with the ExoSeal device. No
immediate complication. Patient tolerated the procedure well.
IMPRESSION: Successful left hepatic [AGE] radio embolization for multifocal
cholangiocarcinoma. (First treatment to the left lobe)

## 2019-12-22 ENCOUNTER — Other Ambulatory Visit: Payer: Self-pay

## 2019-12-22 ENCOUNTER — Telehealth: Payer: Self-pay | Admitting: Oncology

## 2019-12-22 ENCOUNTER — Inpatient Hospital Stay: Payer: PPO

## 2019-12-22 ENCOUNTER — Inpatient Hospital Stay (HOSPITAL_BASED_OUTPATIENT_CLINIC_OR_DEPARTMENT_OTHER): Payer: PPO | Admitting: Nurse Practitioner

## 2019-12-22 ENCOUNTER — Encounter: Payer: Self-pay | Admitting: Nurse Practitioner

## 2019-12-22 VITALS — BP 140/62 | HR 68 | Temp 98.2°F | Resp 16 | Ht 65.0 in | Wt 137.0 lb

## 2019-12-22 DIAGNOSIS — C221 Intrahepatic bile duct carcinoma: Secondary | ICD-10-CM

## 2019-12-22 DIAGNOSIS — Z95828 Presence of other vascular implants and grafts: Secondary | ICD-10-CM

## 2019-12-22 LAB — CBC WITH DIFFERENTIAL (CANCER CENTER ONLY)
Abs Immature Granulocytes: 0 10*3/uL (ref 0.00–0.07)
Basophils Absolute: 0 10*3/uL (ref 0.0–0.1)
Basophils Relative: 1 %
Eosinophils Absolute: 0 10*3/uL (ref 0.0–0.5)
Eosinophils Relative: 1 %
HCT: 35.3 % — ABNORMAL LOW (ref 36.0–46.0)
Hemoglobin: 12 g/dL (ref 12.0–15.0)
Immature Granulocytes: 0 %
Lymphocytes Relative: 25 %
Lymphs Abs: 0.7 10*3/uL (ref 0.7–4.0)
MCH: 32.6 pg (ref 26.0–34.0)
MCHC: 34 g/dL (ref 30.0–36.0)
MCV: 95.9 fL (ref 80.0–100.0)
Monocytes Absolute: 0.4 10*3/uL (ref 0.1–1.0)
Monocytes Relative: 13 %
Neutro Abs: 1.7 10*3/uL (ref 1.7–7.7)
Neutrophils Relative %: 60 %
Platelet Count: 150 10*3/uL (ref 150–400)
RBC: 3.68 MIL/uL — ABNORMAL LOW (ref 3.87–5.11)
RDW: 12 % (ref 11.5–15.5)
WBC Count: 2.8 10*3/uL — ABNORMAL LOW (ref 4.0–10.5)
nRBC: 0 % (ref 0.0–0.2)

## 2019-12-22 LAB — CMP (CANCER CENTER ONLY)
ALT: 11 U/L (ref 0–44)
AST: 16 U/L (ref 15–41)
Albumin: 3.9 g/dL (ref 3.5–5.0)
Alkaline Phosphatase: 112 U/L (ref 38–126)
Anion gap: 9 (ref 5–15)
BUN: 12 mg/dL (ref 8–23)
CO2: 27 mmol/L (ref 22–32)
Calcium: 9.2 mg/dL (ref 8.9–10.3)
Chloride: 105 mmol/L (ref 98–111)
Creatinine: 0.7 mg/dL (ref 0.44–1.00)
GFR, Est AFR Am: >60 mL/min (ref >60)
GFR, Estimated: >60 mL/min (ref >60)
Glucose, Bld: 107 mg/dL — ABNORMAL HIGH (ref 70–99)
Potassium: 3.4 mmol/L — ABNORMAL LOW (ref 3.5–5.1)
Sodium: 141 mmol/L (ref 135–145)
Total Bilirubin: 0.4 mg/dL (ref 0.3–1.2)
Total Protein: 6.5 g/dL (ref 6.5–8.1)

## 2019-12-22 LAB — CK: Total CK: 70 U/L (ref 38–234)

## 2019-12-22 LAB — MAGNESIUM: Magnesium: 2 mg/dL (ref 1.7–2.4)

## 2019-12-22 MED ORDER — SODIUM CHLORIDE 0.9% FLUSH
10.0000 mL | Freq: Once | INTRAVENOUS | Status: AC
  Administered 2019-12-22: 10 mL
  Filled 2019-12-22: qty 10

## 2019-12-22 MED ORDER — POTASSIUM CHLORIDE CRYS ER 20 MEQ PO TBCR: 20.0000 meq | EXTENDED_RELEASE_TABLET | Freq: Every day | ORAL | 1 refills | Status: DC

## 2019-12-22 MED ORDER — HEPARIN SOD (PORK) LOCK FLUSH 100 UNIT/ML IV SOLN
500.0000 [IU] | Freq: Once | INTRAVENOUS | Status: AC
  Administered 2019-12-22: 09:00:00 500 [IU]
  Filled 2019-12-22: qty 5

## 2019-12-22 MED ORDER — TIBSOVO 250 MG PO TABS: 2.0000 | ORAL_TABLET | Freq: Every day | ORAL | 1 refills | Status: DC

## 2019-12-22 NOTE — Progress Notes (Signed)
Sent in Refill for TIBSOVO to  [place] [place] [facility] [place]

## 2019-12-22 NOTE — Telephone Encounter (Signed)
Scheduled per 12/28 los, left a voicemail to notify patient.

## 2019-12-22 NOTE — Progress Notes (Signed)
[place] OFFICE PROGRESS NOTE   Diagnosis: Cholangiocarcinoma  INTERVAL HISTORY:   Laura [name] returns as scheduled.  She continues Ivosidenib.  She denies nausea/vomiting.  No mouth sores.  No diarrhea.  No rash.  No fever, cough, shortness of breath.  No chest pain.  Objective:  Vital signs in last 24 hours:  Blood pressure 140/62, pulse 68, temperature 98.2 F (36.8 C), temperature source Temporal, resp. rate 16, height _0  (1.651 m), weight 137 lb (62.1 kg), SpO2 97 %.    HEENT: No thrush or ulcers. Resp: Lungs clear bilaterally. Cardio: Regular rate and rhythm. GI: Abdomen soft and nontender.  No hepatomegaly.  No mass. Vascular: No leg edema. Neuro: Alert and oriented. Skin: No rash. Port-A-Cath without erythema.  Lab Results:  Lab Results  Component Value Date   WBC 2.8 (L) 12/22/2019   HGB 12.0 12/22/2019   HCT 35.3 (L) 12/22/2019   MCV 95.9 12/22/2019   PLT 150 12/22/2019   NEUTROABS 1.7 12/22/2019    Imaging:  No results found.  Medications: I have reviewed the patient's current medications.  Assessment/Plan: 1. Right liver mass-biopsy 07/10/2016 confirmed adenocarcinoma, PDL 1 0%, MSS, tumor mutation burden 3, IDH1 and PIK3 CA alterations  CT abdomen/pelvis 06/17/2016 and MRI abdomen 06/21/2016 confirmed an isolated right liver mass and a cystic pancreas lesion  PET scan 07/28/2016-the hepatic lesion is not hypermetabolic, hypermetabolic lesion in the gastric antrum suspicious for a primary neoplasm and no other evidence of metastatic disease  Upper endoscopy 08/03/2016-no mass found, biopsy from the gastric antrum-benign  CT-guided ablation of the right liver lesion 11/03/2016  MRI abdomen 03/08/2017-ablation defect in the right liver without residual/recurrent disease  MRI liver 06/11/2017-multiple new enhancing lesions in the right hepatic lobe surrounding the ablation defect consistent with progressive  cholangiocarcinoma  Y-90 radioembolization of the right liver 07/19/2017  MRI abdomen 10/09/2017-suspected progression of intrahepatic larger carcinoma within segment 6, reviewed by interventional radiology most consistent with pseudo-progression  MRI abdomen 01/08/2018-slight decrease in the segment 6 mass, no change in enhancement at the ablation bed, new tiny arterial phase area of hyperenhancement in the lateral left liver-potentially a vascular phenomena  MRI abdomen 04/03/2018-stable segment 6 mass, no change in 10 mm flash filling segment 3 lesion-potentially a vascular shunt, a porta hepatis node measures 2.2 cm compared to 1.5 cm, no new lymphadenopathy  MRI abdomen 07/03/2018 stable segment 6 liver mass, stable segment 3 arterial phase enhancing lesion stable porta hepatis adenopathy, stable pancreas cystic lesion  MRI abdomen 11/20/2018-new 2.1 cm lateral left liver lesion, new 8 mm medial left liver lesion, no change in posterior right hepatic mass, no change in necrotic hepato-duodenal ligament lymph node no change in cystic pancreas lesion  Y 90 of the left liver 01/16/2019  Cycle 1 gemcitabine/cisplatin 03/12/2019  Cycle 2 gemcitabine/cisplatin 04/09/2019 (doses reduced due to neutropenia)  Cycle 3 gemcitabine/cisplatin 05/07/2019  Cycle 4 gemcitabine/cisplatin 05/28/2019-Neulasta not given  Cycle 5 gemcitabine/cisplatin 06/18/2019  MRI abdomen 07/08/2019-progressive liver metastases  Ivosidenib 07/19/2019  MRI abdomen 10/27/2019-multiple hepatic lesions-grossly unchanged, stable to mildly improved porta hepatis node  Ivosidenib placed on hold 11/28/2019 due to QTc interval 488  Ivosidenib resumed 12/05/2019 2. Left leg and right foot pain  Negative left leg Doppler 07/14/2016   3. Right abdominal pain-potentially related to the dominant right liver mass  4. Anorexia/weight loss  5. 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 Anemia-iron deficiency  7. Port-A-Cath placement interventional radiology 03/04/2019  8.Neutropenia secondary to chemotherapy-gemcitabineand cisplatindose reducedbeginning with cycle 2; mild neutropenia and thrombocytopenia 04/22/2019. Gemcitabine and cisplatin held. Treatment changed to every 3 weeks beginning on 05/14/2019.  Disposition: Laura [name] appears stable.  She continues Ivosidenib.  There is no clinical evidence of disease progression.  We reviewed the QTc interval from today, 474.  She will continue Ivosidenib at the current dose with next EKG in 1 month.  We reviewed the CBC and chemistry panel from today.  She has mild hypokalemia.  She will begin K-Dur 20 mEq daily.  She will return for lab, EKG, follow-up in 1 month.  She will contact the office in the interim with any problems.  Plan reviewed with Dr. Benay Spice.    Ned Card ANP/GNP-BC   12/22/2019  10:15 AM

## 2019-12-30 ENCOUNTER — Telehealth: Payer: Self-pay | Admitting: *Deleted

## 2019-12-30 NOTE — Telephone Encounter (Addendum)
Left VM that she has been out of medication since 12/27/19. Called pharmacy and confirmed they have script on file, but her patient assistance ran out on 12/25/19. She needs to call [employer] patient assistance [phone] option 1. Pharmacist reports they attempted to reach her X 3 recently without success. Left this information on her mobile voice mail (no answer at home #) and requested return call to confirm she got message. Patient notified and will call today. Left message for [name] as well.

## 2020-01-02 ENCOUNTER — Telehealth: Payer: Self-pay | Admitting: *Deleted

## 2020-01-02 NOTE — Telephone Encounter (Signed)
Patient wanted MD aware that she has not taken her Ibosidenib since she has been out since 12/26/19. Pharmacy awaiting decision from insurance company and it may not be until next week till she hears or gets medication. Dr. Benay Spice notified and said she will be OK. Patient will keep office posted.

## 2020-01-06 NOTE — Telephone Encounter (Signed)
Received a call from [employer] to inform us that the patient was approved for assistance until 12/24/20.   Tibsovo is scheduled to be delivered to patient tomorrow(01/07/20).  [facility] [place] Phone [phone] Fax [phone] 01/06/2020 11:39 AM

## 2020-01-09 ENCOUNTER — Other Ambulatory Visit: Payer: Self-pay | Admitting: Oncology

## 2020-01-09 DIAGNOSIS — C221 Intrahepatic bile duct carcinoma: Secondary | ICD-10-CM

## 2020-01-19 ENCOUNTER — Other Ambulatory Visit: Payer: Self-pay

## 2020-01-19 ENCOUNTER — Inpatient Hospital Stay: Payer: PPO | Attending: Oncology

## 2020-01-19 ENCOUNTER — Inpatient Hospital Stay (HOSPITAL_BASED_OUTPATIENT_CLINIC_OR_DEPARTMENT_OTHER): Payer: PPO | Admitting: Oncology

## 2020-01-19 ENCOUNTER — Inpatient Hospital Stay: Payer: PPO

## 2020-01-19 VITALS — BP 131/59 | HR 73 | Temp 98.5°F | Resp 20 | Wt 134.7 lb

## 2020-01-19 DIAGNOSIS — C221 Intrahepatic bile duct carcinoma: Secondary | ICD-10-CM | POA: Insufficient documentation

## 2020-01-19 DIAGNOSIS — Z95828 Presence of other vascular implants and grafts: Secondary | ICD-10-CM

## 2020-01-19 DIAGNOSIS — D509 Iron deficiency anemia, unspecified: Secondary | ICD-10-CM

## 2020-01-19 LAB — CMP (CANCER CENTER ONLY)
ALT: 7 U/L (ref 0–44)
AST: 14 U/L — ABNORMAL LOW (ref 15–41)
Albumin: 4 g/dL (ref 3.5–5.0)
Alkaline Phosphatase: 94 U/L (ref 38–126)
Anion gap: 7 (ref 5–15)
BUN: 11 mg/dL (ref 8–23)
CO2: 28 mmol/L (ref 22–32)
Calcium: 9.7 mg/dL (ref 8.9–10.3)
Chloride: 105 mmol/L (ref 98–111)
Creatinine: 0.72 mg/dL (ref 0.44–1.00)
GFR, Est AFR Am: >60 mL/min (ref >60)
GFR, Estimated: >60 mL/min (ref >60)
Glucose, Bld: 92 mg/dL (ref 70–99)
Potassium: 3.7 mmol/L (ref 3.5–5.1)
Sodium: 140 mmol/L (ref 135–145)
Total Bilirubin: 0.5 mg/dL (ref 0.3–1.2)
Total Protein: 6.5 g/dL (ref 6.5–8.1)

## 2020-01-19 LAB — CBC WITH DIFFERENTIAL (CANCER CENTER ONLY)
Abs Immature Granulocytes: 0 10*3/uL (ref 0.00–0.07)
Basophils Absolute: 0 10*3/uL (ref 0.0–0.1)
Basophils Relative: 1 %
Eosinophils Absolute: 0 10*3/uL (ref 0.0–0.5)
Eosinophils Relative: 1 %
HCT: 36.9 % (ref 36.0–46.0)
Hemoglobin: 12.6 g/dL (ref 12.0–15.0)
Immature Granulocytes: 0 %
Lymphocytes Relative: 26 %
Lymphs Abs: 0.8 10*3/uL (ref 0.7–4.0)
MCH: 32.7 pg (ref 26.0–34.0)
MCHC: 34.1 g/dL (ref 30.0–36.0)
MCV: 95.8 fL (ref 80.0–100.0)
Monocytes Absolute: 0.4 10*3/uL (ref 0.1–1.0)
Monocytes Relative: 12 %
Neutro Abs: 1.8 10*3/uL (ref 1.7–7.7)
Neutrophils Relative %: 60 %
Platelet Count: 170 10*3/uL (ref 150–400)
RBC: 3.85 MIL/uL — ABNORMAL LOW (ref 3.87–5.11)
RDW: 11.7 % (ref 11.5–15.5)
WBC Count: 2.9 10*3/uL — ABNORMAL LOW (ref 4.0–10.5)
nRBC: 0 % (ref 0.0–0.2)

## 2020-01-19 LAB — CK: Total CK: 50 U/L (ref 38–234)

## 2020-01-19 LAB — MAGNESIUM: Magnesium: 2 mg/dL (ref 1.7–2.4)

## 2020-01-19 MED ORDER — SODIUM CHLORIDE 0.9% FLUSH
10.0000 mL | Freq: Once | INTRAVENOUS | Status: AC
  Administered 2020-01-19: 10 mL
  Filled 2020-01-19: qty 10

## 2020-01-19 MED ORDER — HEPARIN SOD (PORK) LOCK FLUSH 100 UNIT/ML IV SOLN
500.0000 [IU] | Freq: Once | INTRAVENOUS | Status: AC
  Administered 2020-01-19: 500 [IU]
  Filled 2020-01-19: qty 5

## 2020-01-19 NOTE — Progress Notes (Signed)
[place] OFFICE VISIT PROGRESS NOTE  I connected with Laura [name] on 01/19/20 at  8:45 AM EST by video and verified that I am speaking with the correct person using two identifiers.   I discussed the limitations, risks, security and privacy concerns of performing an evaluation and management service by telemedicine and the availability of in-person appointments. I also discussed with the patient that there may be a patient responsible charge related to this service. The patient expressed understanding and agreed to proceed.    Patient's location: Office Provider's location: Home    Diagnosis: Cholangiocarcinoma  INTERVAL HISTORY:   Laura [name] returns as scheduled.  She takes Compazine each morning and does not have nausea.  No new complaint.  She continues ivosidenib.  Lab Results:  Lab Results  Component Value Date   WBC 2.9 (L) 01/19/2020   HGB 12.6 01/19/2020   HCT 36.9 01/19/2020   MCV 95.8 01/19/2020   PLT 170 01/19/2020   NEUTROABS 1.8 01/19/2020    Imaging:  No results found.  Medications: I have reviewed the patient's current medications.  Assessment/Plan: 1. Right liver mass-biopsy 07/10/2016 confirmed adenocarcinoma, PDL 1 0%, MSS, tumor mutation burden 3, IDH1 and PIK3 CA alterations  CT abdomen/pelvis 06/17/2016 and MRI abdomen 06/21/2016 confirmed an isolated right liver mass and a cystic pancreas lesion  PET scan 07/28/2016-the hepatic lesion is not hypermetabolic, hypermetabolic lesion in the gastric antrum suspicious for a primary neoplasm and no other evidence of metastatic disease  Upper endoscopy 08/03/2016-no mass found, biopsy from the gastric antrum-benign  CT-guided ablation of the right liver lesion 11/03/2016  MRI abdomen 03/08/2017-ablation defect in the right liver without residual/recurrent disease  MRI liver 06/11/2017-multiple new enhancing lesions in the right hepatic lobe  surrounding the ablation defect consistent with progressive cholangiocarcinoma  Y-90 radioembolization of the right liver 07/19/2017  MRI abdomen 10/09/2017-suspected progression of intrahepatic larger carcinoma within segment 6, reviewed by interventional radiology most consistent with pseudo-progression  MRI abdomen 01/08/2018-slight decrease in the segment 6 mass, no change in enhancement at the ablation bed, new tiny arterial phase area of hyperenhancement in the lateral left liver-potentially a vascular phenomena  MRI abdomen 04/03/2018-stable segment 6 mass, no change in 10 mm flash filling segment 3 lesion-potentially a vascular shunt, a porta hepatis node measures 2.2 cm compared to 1.5 cm, no new lymphadenopathy  MRI abdomen 07/03/2018 stable segment 6 liver mass, stable segment 3 arterial phase enhancing lesion stable porta hepatis adenopathy, stable pancreas cystic lesion  MRI abdomen 11/20/2018-new 2.1 cm lateral left liver lesion, new 8 mm medial left liver lesion, no change in posterior right hepatic mass, no change in necrotic hepato-duodenal ligament lymph node no change in cystic pancreas lesion  Y 90 of the left liver 01/16/2019  Cycle 1 gemcitabine/cisplatin 03/12/2019  Cycle 2 gemcitabine/cisplatin 04/09/2019 (doses reduced due to neutropenia)  Cycle 3 gemcitabine/cisplatin 05/07/2019  Cycle 4 gemcitabine/cisplatin 05/28/2019-Neulasta not given  Cycle 5 gemcitabine/cisplatin 06/18/2019  MRI abdomen 07/08/2019-progressive liver metastases  Ivosidenib 07/19/2019  MRI abdomen 10/27/2019-multiple hepatic lesions-grossly unchanged, stable to mildly improved porta hepatis node  Ivosidenib placed on hold 11/28/2019 due to QTc interval 488  Ivosidenib resumed 12/05/2019 2. Left leg and right foot pain  Negative left leg Doppler 07/14/2016   3. Right abdominal pain-potentially related to the dominant right liver mass  4. Anorexia/weight loss  5. 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 Anemia-iron deficiency  7. Port-A-Cath placement interventional radiology 03/04/2019  8.Neutropenia secondary to chemotherapy-gemcitabineand cisplatindose reducedbeginning with cycle 2; mild neutropenia and thrombocytopenia 04/22/2019. Gemcitabine and cisplatin held. Treatment changed to every 3 weeks beginning on 05/14/2019.  Disposition: Laura [name] appears stable.  She is tolerating the ivosidenib well.  Her laboratory parameters and EKG are within goal range.  She will return for an office and lab visit in 1 month.  The plan is to schedule a restaging MRI of the liver in 2 months.   I discussed the assessment and treatment plan with the patient. The patient was provided an opportunity to ask questions and all were answered. The patient agreed with the plan and demonstrated an understanding of the instructions.   The patient was advised to call back or seek an in-person evaluation if the symptoms worsen or if the condition fails to improve as anticipated.    [name] ANP/GNP-BC   01/19/2020 9:25 AM

## 2020-02-02 ENCOUNTER — Other Ambulatory Visit: Payer: Self-pay | Admitting: *Deleted

## 2020-02-02 DIAGNOSIS — C221 Intrahepatic bile duct carcinoma: Secondary | ICD-10-CM

## 2020-02-02 MED ORDER — CLONAZEPAM 0.5 MG PO TABS: 0.7500 mg | ORAL_TABLET | Freq: Two times a day (BID) | ORAL | 0 refills | Status: DC | PRN

## 2020-02-09 ENCOUNTER — Other Ambulatory Visit: Payer: Self-pay | Admitting: Oncology

## 2020-02-09 DIAGNOSIS — C221 Intrahepatic bile duct carcinoma: Secondary | ICD-10-CM

## 2020-02-16 ENCOUNTER — Inpatient Hospital Stay: Payer: PPO

## 2020-02-16 ENCOUNTER — Other Ambulatory Visit: Payer: Self-pay

## 2020-02-16 ENCOUNTER — Telehealth: Payer: Self-pay | Admitting: Oncology

## 2020-02-16 ENCOUNTER — Encounter: Payer: Self-pay | Admitting: Nurse Practitioner

## 2020-02-16 ENCOUNTER — Inpatient Hospital Stay: Payer: PPO | Attending: Oncology | Admitting: Nurse Practitioner

## 2020-02-16 VITALS — BP 154/70 | HR 80 | Temp 98.2°F | Resp 17 | Ht 65.0 in | Wt 133.5 lb

## 2020-02-16 DIAGNOSIS — Z9221 Personal history of antineoplastic chemotherapy: Secondary | ICD-10-CM | POA: Diagnosis not present

## 2020-02-16 DIAGNOSIS — C221 Intrahepatic bile duct carcinoma: Secondary | ICD-10-CM | POA: Insufficient documentation

## 2020-02-16 DIAGNOSIS — D509 Iron deficiency anemia, unspecified: Secondary | ICD-10-CM | POA: Diagnosis not present

## 2020-02-16 DIAGNOSIS — Z95828 Presence of other vascular implants and grafts: Secondary | ICD-10-CM | POA: Diagnosis not present

## 2020-02-16 LAB — CBC WITH DIFFERENTIAL (CANCER CENTER ONLY)
Abs Immature Granulocytes: 0.01 10*3/uL (ref 0.00–0.07)
Basophils Absolute: 0 10*3/uL (ref 0.0–0.1)
Basophils Relative: 1 %
Eosinophils Absolute: 0 10*3/uL (ref 0.0–0.5)
Eosinophils Relative: 0 %
HCT: 35.9 % — ABNORMAL LOW (ref 36.0–46.0)
Hemoglobin: 12.3 g/dL (ref 12.0–15.0)
Immature Granulocytes: 0 %
Lymphocytes Relative: 20 %
Lymphs Abs: 0.7 10*3/uL (ref 0.7–4.0)
MCH: 33.1 pg (ref 26.0–34.0)
MCHC: 34.3 g/dL (ref 30.0–36.0)
MCV: 96.5 fL (ref 80.0–100.0)
Monocytes Absolute: 0.4 10*3/uL (ref 0.1–1.0)
Monocytes Relative: 13 %
Neutro Abs: 2.1 10*3/uL (ref 1.7–7.7)
Neutrophils Relative %: 66 %
Platelet Count: 177 10*3/uL (ref 150–400)
RBC: 3.72 MIL/uL — ABNORMAL LOW (ref 3.87–5.11)
RDW: 12.1 % (ref 11.5–15.5)
WBC Count: 3.3 10*3/uL — ABNORMAL LOW (ref 4.0–10.5)
nRBC: 0 % (ref 0.0–0.2)

## 2020-02-16 LAB — CMP (CANCER CENTER ONLY)
ALT: 12 U/L (ref 0–44)
AST: 16 U/L (ref 15–41)
Albumin: 3.8 g/dL (ref 3.5–5.0)
Alkaline Phosphatase: 101 U/L (ref 38–126)
Anion gap: 9 (ref 5–15)
BUN: 12 mg/dL (ref 8–23)
CO2: 28 mmol/L (ref 22–32)
Calcium: 9.4 mg/dL (ref 8.9–10.3)
Chloride: 105 mmol/L (ref 98–111)
Creatinine: 0.69 mg/dL (ref 0.44–1.00)
GFR, Est AFR Am: >60 mL/min (ref >60)
GFR, Estimated: >60 mL/min (ref >60)
Glucose, Bld: 112 mg/dL — ABNORMAL HIGH (ref 70–99)
Potassium: 3.7 mmol/L (ref 3.5–5.1)
Sodium: 142 mmol/L (ref 135–145)
Total Bilirubin: 0.4 mg/dL (ref 0.3–1.2)
Total Protein: 6.6 g/dL (ref 6.5–8.1)

## 2020-02-16 LAB — MAGNESIUM: Magnesium: 2 mg/dL (ref 1.7–2.4)

## 2020-02-16 MED ORDER — SODIUM CHLORIDE 0.9% FLUSH
10.0000 mL | Freq: Once | INTRAVENOUS | Status: AC
  Administered 2020-02-16: 12:00:00 10 mL
  Filled 2020-02-16: qty 10

## 2020-02-16 MED ORDER — HEPARIN SOD (PORK) LOCK FLUSH 100 UNIT/ML IV SOLN
500.0000 [IU] | Freq: Once | INTRAVENOUS | Status: AC
  Administered 2020-02-16: 500 [IU]
  Filled 2020-02-16: qty 5

## 2020-02-16 NOTE — Progress Notes (Signed)
[place] OFFICE PROGRESS NOTE   Diagnosis: Cholangiocarcinoma  INTERVAL HISTORY:   Laura [name] returns as scheduled.  She continues Ivosidenib.  She feels well.  Her appetite has improved.  No nausea or vomiting.  No mouth sores.  No diarrhea.  No rash.  She denies fever, cough, shortness of breath.  No pain.  Objective:  Vital signs in last 24 hours:  Blood pressure (!) 154/70, pulse 80, temperature 98.2 F (36.8 C), temperature source Temporal, resp. rate 17, height '5\' 5"'  (1.651 m), weight 133 lb 8 oz (60.6 kg), SpO2 97 %.    HEENT: No thrush or ulcers. Resp: Lungs clear bilaterally. Cardio: Regular rate and rhythm. GI: Abdomen soft and nontender.  No hepatomegaly. Vascular: No leg edema. Neuro: Alert and oriented. Skin: No rash. Port-A-Cath without erythema.   Lab Results:  Lab Results  Component Value Date   WBC 2.9 (L) 01/19/2020   HGB 12.6 01/19/2020   HCT 36.9 01/19/2020   MCV 95.8 01/19/2020   PLT 170 01/19/2020   NEUTROABS 1.8 01/19/2020    Imaging:  No results found.  Medications: I have reviewed the patient's current medications.  Assessment/Plan: 1. Right liver mass-biopsy 07/10/2016 confirmed adenocarcinoma, PDL 1 0%, MSS, tumor mutation burden 3, IDH1 and PIK3 CA alterations  CT abdomen/pelvis 06/17/2016 and MRI abdomen 06/21/2016 confirmed an isolated right liver mass and a cystic pancreas lesion  PET scan 07/28/2016-the hepatic lesion is not hypermetabolic, hypermetabolic lesion in the gastric antrum suspicious for a primary neoplasm and no other evidence of metastatic disease  Upper endoscopy 08/03/2016-no mass found, biopsy from the gastric antrum-benign  CT-guided ablation of the right liver lesion 11/03/2016  MRI abdomen 03/08/2017-ablation defect in the right liver without residual/recurrent disease  MRI liver 06/11/2017-multiple new enhancing lesions in the right hepatic lobe surrounding the ablation defect  consistent with progressive cholangiocarcinoma  Y-90 radioembolization of the right liver 07/19/2017  MRI abdomen 10/09/2017-suspected progression of intrahepatic larger carcinoma within segment 6, reviewed by interventional radiology most consistent with pseudo-progression  MRI abdomen 01/08/2018-slight decrease in the segment 6 mass, no change in enhancement at the ablation bed, new tiny arterial phase area of hyperenhancement in the lateral left liver-potentially a vascular phenomena  MRI abdomen 04/03/2018-stable segment 6 mass, no change in 10 mm flash filling segment 3 lesion-potentially a vascular shunt, a porta hepatis node measures 2.2 cm compared to 1.5 cm, no new lymphadenopathy  MRI abdomen 07/03/2018 stable segment 6 liver mass, stable segment 3 arterial phase enhancing lesion stable porta hepatis adenopathy, stable pancreas cystic lesion  MRI abdomen 11/20/2018-new 2.1 cm lateral left liver lesion, new 8 mm medial left liver lesion, no change in posterior right hepatic mass, no change in necrotic hepato-duodenal ligament lymph node no change in cystic pancreas lesion  Y 90 of the left liver 01/16/2019  Cycle 1 gemcitabine/cisplatin 03/12/2019  Cycle 2 gemcitabine/cisplatin 04/09/2019 (doses reduced due to neutropenia)  Cycle 3 gemcitabine/cisplatin 05/07/2019  Cycle 4 gemcitabine/cisplatin 05/28/2019-Neulasta not given  Cycle 5 gemcitabine/cisplatin 06/18/2019  MRI abdomen 07/08/2019-progressive liver metastases  Ivosidenib 07/19/2019  MRI abdomen 10/27/2019-multiple hepatic lesions-grossly unchanged, stable to mildly improved porta hepatis node  Ivosidenib placed on hold 11/28/2019 due to QTc interval 488  Ivosidenib resumed 12/05/2019 2. Left leg and right foot pain  Negative left leg Doppler 07/14/2016   3. Right abdominal pain-potentially related to the dominant right liver mass  4. Anorexia/weight loss  5. 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 Anemia-iron deficiency  7. Port-A-Cath placement interventional radiology 03/04/2019  8.Neutropenia secondary to chemotherapy-gemcitabineand cisplatindose reducedbeginning with cycle 2; mild neutropenia and thrombocytopenia 04/22/2019. Gemcitabine and cisplatin held. Treatment changed to every 3 weeks beginning on 05/14/2019.   Disposition: Laura [name] appears stable.  There is no clinical evidence of disease progression.  She will continue Ivosidenib.  She continues to tolerate well.  We are referring her for a restaging abdominal MRI in approximately 1 month.  We reviewed the EKG from today.  QTc interval in goal range.   We reviewed the CBC from today.  Counts adequate to continue with treatment as above.  She will return for lab, EKG, MRI in approximately 1 month.  We will see her a few days later to review those results.  She will contact the office in the interim with any problems.  Ned Card ANP/GNP-BC   02/16/2020  11:48 AM

## 2020-02-16 NOTE — Telephone Encounter (Signed)
Scheduled appt per 2/22 los - gave patient AVS pt aware of appts

## 2020-02-17 ENCOUNTER — Other Ambulatory Visit: Payer: Self-pay | Admitting: *Deleted

## 2020-02-17 DIAGNOSIS — E7800 Pure hypercholesterolemia, unspecified: Secondary | ICD-10-CM | POA: Diagnosis not present

## 2020-02-17 DIAGNOSIS — C221 Intrahepatic bile duct carcinoma: Secondary | ICD-10-CM

## 2020-02-17 DIAGNOSIS — R7302 Impaired glucose tolerance (oral): Secondary | ICD-10-CM | POA: Diagnosis not present

## 2020-02-17 MED ORDER — TIBSOVO 250 MG PO TABS: 2.0000 | ORAL_TABLET | Freq: Every day | ORAL | 2 refills | Status: DC

## 2020-02-17 NOTE — Progress Notes (Signed)
Received fax refill request from [facility] for Tibsovo 250mg  tablets. Refill e-scribed.

## 2020-02-23 DIAGNOSIS — H5203 Hypermetropia, bilateral: Secondary | ICD-10-CM | POA: Diagnosis not present

## 2020-02-23 DIAGNOSIS — H2513 Age-related nuclear cataract, bilateral: Secondary | ICD-10-CM | POA: Diagnosis not present

## 2020-02-24 DIAGNOSIS — K589 Irritable bowel syndrome without diarrhea: Secondary | ICD-10-CM | POA: Diagnosis not present

## 2020-02-24 DIAGNOSIS — R809 Proteinuria, unspecified: Secondary | ICD-10-CM | POA: Diagnosis not present

## 2020-02-24 DIAGNOSIS — R82998 Other abnormal findings in urine: Secondary | ICD-10-CM | POA: Diagnosis not present

## 2020-02-24 DIAGNOSIS — Z0000 Encounter for general adult medical examination without abnormal findings: Secondary | ICD-10-CM | POA: Diagnosis not present

## 2020-02-24 DIAGNOSIS — F430 Acute stress reaction: Secondary | ICD-10-CM | POA: Diagnosis not present

## 2020-02-24 DIAGNOSIS — Z1339 Encounter for screening examination for other mental health and behavioral disorders: Secondary | ICD-10-CM | POA: Diagnosis not present

## 2020-02-24 DIAGNOSIS — D692 Other nonthrombocytopenic purpura: Secondary | ICD-10-CM | POA: Diagnosis not present

## 2020-02-24 DIAGNOSIS — I5189 Other ill-defined heart diseases: Secondary | ICD-10-CM | POA: Diagnosis not present

## 2020-02-24 DIAGNOSIS — I517 Cardiomegaly: Secondary | ICD-10-CM | POA: Diagnosis not present

## 2020-02-24 DIAGNOSIS — E7800 Pure hypercholesterolemia, unspecified: Secondary | ICD-10-CM | POA: Diagnosis not present

## 2020-02-24 DIAGNOSIS — J302 Other seasonal allergic rhinitis: Secondary | ICD-10-CM | POA: Diagnosis not present

## 2020-02-24 DIAGNOSIS — I129 Hypertensive chronic kidney disease with stage 1 through stage 4 chronic kidney disease, or unspecified chronic kidney disease: Secondary | ICD-10-CM | POA: Diagnosis not present

## 2020-02-24 DIAGNOSIS — Z1331 Encounter for screening for depression: Secondary | ICD-10-CM | POA: Diagnosis not present

## 2020-02-24 DIAGNOSIS — N182 Chronic kidney disease, stage 2 (mild): Secondary | ICD-10-CM | POA: Diagnosis not present

## 2020-02-24 DIAGNOSIS — R7302 Impaired glucose tolerance (oral): Secondary | ICD-10-CM | POA: Diagnosis not present

## 2020-03-01 ENCOUNTER — Other Ambulatory Visit: Payer: Self-pay | Admitting: Nurse Practitioner

## 2020-03-01 DIAGNOSIS — C221 Intrahepatic bile duct carcinoma: Secondary | ICD-10-CM

## 2020-03-05 ENCOUNTER — Telehealth: Payer: Self-pay | Admitting: Oncology

## 2020-03-05 ENCOUNTER — Telehealth: Payer: Self-pay | Admitting: *Deleted

## 2020-03-05 NOTE — Telephone Encounter (Signed)
R/s appt per 3/12 sch message - pt aware of appts d/t

## 2020-03-05 NOTE — Telephone Encounter (Signed)
Called to report she moved her MRI to 03/16/20 at 0700--not able to go too long without eating.  Currently her lab/OV is on 03/12/20. Will have her appointments moved to 03/17/20--scheduling message sent.

## 2020-03-08 ENCOUNTER — Other Ambulatory Visit: Payer: Self-pay | Admitting: Oncology

## 2020-03-08 DIAGNOSIS — C221 Intrahepatic bile duct carcinoma: Secondary | ICD-10-CM

## 2020-03-10 DIAGNOSIS — I129 Hypertensive chronic kidney disease with stage 1 through stage 4 chronic kidney disease, or unspecified chronic kidney disease: Secondary | ICD-10-CM | POA: Diagnosis not present

## 2020-03-12 ENCOUNTER — Other Ambulatory Visit: Payer: PPO

## 2020-03-12 ENCOUNTER — Ambulatory Visit (HOSPITAL_COMMUNITY): Admission: RE | Admit: 2020-03-12 | Payer: PPO | Source: Ambulatory Visit

## 2020-03-15 ENCOUNTER — Ambulatory Visit: Payer: PPO | Admitting: Oncology

## 2020-03-16 ENCOUNTER — Other Ambulatory Visit: Payer: Self-pay

## 2020-03-16 ENCOUNTER — Ambulatory Visit (HOSPITAL_COMMUNITY)
Admission: RE | Admit: 2020-03-16 | Discharge: 2020-03-16 | Disposition: A | Discharge status: Home / Self Care | Payer: PPO | Source: Ambulatory Visit | Attending: Nurse Practitioner | Admitting: Nurse Practitioner

## 2020-03-16 DIAGNOSIS — C221 Intrahepatic bile duct carcinoma: Secondary | ICD-10-CM | POA: Diagnosis not present

## 2020-03-16 DIAGNOSIS — C787 Secondary malignant neoplasm of liver and intrahepatic bile duct: Secondary | ICD-10-CM | POA: Diagnosis not present

## 2020-03-16 MED ORDER — GADOBUTROL 1 MMOL/ML IV SOLN
6.0000 mL | Freq: Once | INTRAVENOUS | Status: AC | PRN
  Administered 2020-03-16: 6 mL via INTRAVENOUS

## 2020-03-17 ENCOUNTER — Inpatient Hospital Stay: Payer: PPO

## 2020-03-17 ENCOUNTER — Other Ambulatory Visit: Payer: Self-pay

## 2020-03-17 ENCOUNTER — Inpatient Hospital Stay: Payer: PPO | Attending: Oncology

## 2020-03-17 ENCOUNTER — Telehealth: Payer: Self-pay | Admitting: Oncology

## 2020-03-17 ENCOUNTER — Inpatient Hospital Stay (HOSPITAL_BASED_OUTPATIENT_CLINIC_OR_DEPARTMENT_OTHER): Payer: PPO | Admitting: Oncology

## 2020-03-17 VITALS — BP 135/66 | HR 83 | Temp 97.8°F | Resp 17 | Ht 65.0 in | Wt 137.7 lb

## 2020-03-17 DIAGNOSIS — C221 Intrahepatic bile duct carcinoma: Secondary | ICD-10-CM

## 2020-03-17 DIAGNOSIS — Z95828 Presence of other vascular implants and grafts: Secondary | ICD-10-CM | POA: Diagnosis not present

## 2020-03-17 DIAGNOSIS — D509 Iron deficiency anemia, unspecified: Secondary | ICD-10-CM | POA: Diagnosis not present

## 2020-03-17 DIAGNOSIS — Z452 Encounter for adjustment and management of vascular access device: Secondary | ICD-10-CM | POA: Diagnosis not present

## 2020-03-17 LAB — CMP (CANCER CENTER ONLY)
ALT: 11 U/L (ref 0–44)
AST: 17 U/L (ref 15–41)
Albumin: 3.8 g/dL (ref 3.5–5.0)
Alkaline Phosphatase: 116 U/L (ref 38–126)
Anion gap: 7 (ref 5–15)
BUN: 10 mg/dL (ref 8–23)
CO2: 28 mmol/L (ref 22–32)
Calcium: 9.5 mg/dL (ref 8.9–10.3)
Chloride: 105 mmol/L (ref 98–111)
Creatinine: 0.72 mg/dL (ref 0.44–1.00)
GFR, Est AFR Am: >60 mL/min (ref >60)
GFR, Estimated: >60 mL/min (ref >60)
Glucose, Bld: 99 mg/dL (ref 70–99)
Potassium: 3.7 mmol/L (ref 3.5–5.1)
Sodium: 140 mmol/L (ref 135–145)
Total Bilirubin: 0.5 mg/dL (ref 0.3–1.2)
Total Protein: 6.5 g/dL (ref 6.5–8.1)

## 2020-03-17 LAB — MAGNESIUM: Magnesium: 1.9 mg/dL (ref 1.7–2.4)

## 2020-03-17 LAB — CBC WITH DIFFERENTIAL (CANCER CENTER ONLY)
Abs Immature Granulocytes: 0.02 10*3/uL (ref 0.00–0.07)
Basophils Absolute: 0 10*3/uL (ref 0.0–0.1)
Basophils Relative: 1 %
Eosinophils Absolute: 0 10*3/uL (ref 0.0–0.5)
Eosinophils Relative: 1 %
HCT: 36.7 % (ref 36.0–46.0)
Hemoglobin: 12.4 g/dL (ref 12.0–15.0)
Immature Granulocytes: 1 %
Lymphocytes Relative: 24 %
Lymphs Abs: 0.7 10*3/uL (ref 0.7–4.0)
MCH: 32.9 pg (ref 26.0–34.0)
MCHC: 33.8 g/dL (ref 30.0–36.0)
MCV: 97.3 fL (ref 80.0–100.0)
Monocytes Absolute: 0.4 10*3/uL (ref 0.1–1.0)
Monocytes Relative: 13 %
Neutro Abs: 1.9 10*3/uL (ref 1.7–7.7)
Neutrophils Relative %: 60 %
Platelet Count: 166 10*3/uL (ref 150–400)
RBC: 3.77 MIL/uL — ABNORMAL LOW (ref 3.87–5.11)
RDW: 12.3 % (ref 11.5–15.5)
WBC Count: 3.1 10*3/uL — ABNORMAL LOW (ref 4.0–10.5)
nRBC: 0 % (ref 0.0–0.2)

## 2020-03-17 MED ORDER — HEPARIN SOD (PORK) LOCK FLUSH 100 UNIT/ML IV SOLN
500.0000 [IU] | Freq: Once | INTRAVENOUS | Status: AC
  Administered 2020-03-17: 500 [IU]
  Filled 2020-03-17: qty 5

## 2020-03-17 MED ORDER — SODIUM CHLORIDE 0.9% FLUSH
10.0000 mL | Freq: Once | INTRAVENOUS | Status: AC
  Administered 2020-03-17: 10:00:00 10 mL
  Filled 2020-03-17: qty 10

## 2020-03-17 NOTE — Telephone Encounter (Signed)
Scheduled per los. Gave avs and calendar  

## 2020-03-17 NOTE — Progress Notes (Signed)
[facility] OFFICE PROGRESS NOTE   Diagnosis: Cholangiocarcinoma  INTERVAL HISTORY:   Ms. Smitherman returns as scheduled.  She feels well.  Good appetite.  She continues to take Compazine each morning to prevent nausea.  No pain.  She continues Ivosidenib.  Objective:  Vital signs in last 24 hours:  Blood pressure 135/66, pulse 83, temperature 97.8 F (36.6 C), temperature source Temporal, resp. rate 17, height '5\' 5"'$  (1.651 m), weight 137 lb 11.2 oz (62.5 kg), SpO2 98 %.    Resp: Lungs clear bilaterally Cardio: Regular rate and rhythm GI: No hepatomegaly, no mass, nontender Vascular: No leg edema   Portacath/PICC-without erythema  Lab Results:  Lab Results  Component Value Date   WBC 3.1 (L) 03/17/2020   HGB 12.4 03/17/2020   HCT 36.7 03/17/2020   MCV 97.3 03/17/2020   PLT 166 03/17/2020   NEUTROABS 1.9 03/17/2020    CMP  Lab Results  Component Value Date   NA 140 03/17/2020   K 3.7 03/17/2020   CL 105 03/17/2020   CO2 28 03/17/2020   GLUCOSE 99 03/17/2020   BUN 10 03/17/2020   CREATININE 0.72 03/17/2020   CALCIUM 9.5 03/17/2020   PROT 6.5 03/17/2020   ALBUMIN 3.8 03/17/2020   AST 17 03/17/2020   ALT 11 03/17/2020   ALKPHOS 116 03/17/2020   BILITOT 0.5 03/17/2020   GFRNONAA >60 03/17/2020   GFRAA >60 03/17/2020    EKG-normal sinus rhythm, QTC 441 Imaging:  MR Abdomen W Wo Contrast  Result Date: 03/16/2020 CLINICAL DATA:  History of cholangiocarcinoma.  Restaging. EXAM: MRI ABDOMEN WITHOUT AND WITH CONTRAST TECHNIQUE: Multiplanar multisequence MR imaging of the abdomen was performed both before and after the administration of intravenous contrast. CONTRAST:  62m GADAVIST GADOBUTROL 1 MMOL/ML IV SOLN COMPARISON:  10/27/2019 FINDINGS: Lower chest: No acute findings. Hepatobiliary: Again seen are multifocal liver lesions: -index lesion along the posterior aspect of segment 6 measures 2.6 x 1.8 cm, image 40/20. Previously 2.9 x 2.2 cm. -index  lesion within segment 2 measures 1.3 cm, image 19/20. Previously 1.4 cm. -segment 4a lesion measures 1.3 cm, image 24/20.  Previously 1.6 cm. -Adjacent segment 4a measures 1 cm, image 28/20.  Previously 1.6 cm. Cholecystectomy.  No intrahepatic bile duct dilatation. Pancreas: Cystic lesion associated with anterior aspect of pancreatic head measures 2.6 cm, image 24/5. Unchanged from previous exam. No main duct dilatation or inflammation. Spleen:  Within normal limits in size and appearance. Adrenals/Urinary Tract: Normal appearance of the adrenal glands. Bilateral renal sinus cysts noted. No hydronephrosis or mass identified. Stomach/Bowel: Visualized portions within the abdomen are unremarkable. Vascular/Lymphatic:  Normal caliber of the abdominal aorta. Index porta hepatic lymph node measures 2.2 cm, image 15/5. Previously 2.3 cm. Other:  No ascites or focal fluid collections. Musculoskeletal: No suspicious bone lesions identified. IMPRESSION: 1. Stable to mild improvement in multifocal liver lesions. No new or progressive findings identified. 2. Stable porta hepatic nodal metastasis. 3. Unchanged cystic lesion within the anterior head of pancreas. Electronically Signed   By: TKerby MoorsM.D.   On: 03/16/2020 08:46    Medications: I have reviewed the patient's current medications.   Assessment/Plan: 1. Right liver mass-biopsy 07/10/2016 confirmed adenocarcinoma, PDL 1 0%, MSS, tumor mutation burden 3, IDH1 and PIK3 CA alterations  CT abdomen/pelvis 06/17/2016 and MRI abdomen 06/21/2016 confirmed an isolated right liver mass and a cystic pancreas lesion  PET scan 07/28/2016-the hepatic lesion is not hypermetabolic, hypermetabolic lesion in the gastric antrum suspicious for  a primary neoplasm and no other evidence of metastatic disease  Upper endoscopy 08/03/2016-no mass found, biopsy from the gastric antrum-benign  CT-guided ablation of the right liver lesion 11/03/2016  MRI abdomen  03/08/2017-ablation defect in the right liver without residual/recurrent disease  MRI liver 06/11/2017-multiple new enhancing lesions in the right hepatic lobe surrounding the ablation defect consistent with progressive cholangiocarcinoma  Y-90 radioembolization of the right liver 07/19/2017  MRI abdomen 10/09/2017-suspected progression of intrahepatic larger carcinoma within segment 6, reviewed by interventional radiology most consistent with pseudo-progression  MRI abdomen 01/08/2018-slight decrease in the segment 6 mass, no change in enhancement at the ablation bed, new tiny arterial phase area of hyperenhancement in the lateral left liver-potentially a vascular phenomena  MRI abdomen 04/03/2018-stable segment 6 mass, no change in 10 mm flash filling segment 3 lesion-potentially a vascular shunt, a porta hepatis node measures 2.2 cm compared to 1.5 cm, no new lymphadenopathy  MRI abdomen 07/03/2018 stable segment 6 liver mass, stable segment 3 arterial phase enhancing lesion stable porta hepatis adenopathy, stable pancreas cystic lesion  MRI abdomen 11/20/2018-new 2.1 cm lateral left liver lesion, new 8 mm medial left liver lesion, no change in posterior right hepatic mass, no change in necrotic hepato-duodenal ligament lymph node no change in cystic pancreas lesion  Y 90 of the left liver 01/16/2019  Cycle 1 gemcitabine/cisplatin 03/12/2019  Cycle 2 gemcitabine/cisplatin 04/09/2019 (doses reduced due to neutropenia)  Cycle 3 gemcitabine/cisplatin 05/07/2019  Cycle 4 gemcitabine/cisplatin 05/28/2019-Neulasta not given  Cycle 5 gemcitabine/cisplatin 06/18/2019  MRI abdomen 07/08/2019-progressive liver metastases  Ivosidenib 07/19/2019  MRI abdomen 10/27/2019-multiple hepatic lesions-grossly unchanged, stable to mildly improved porta hepatis node  Ivosidenib placed on hold 11/28/2019 due to QTc interval 488  Ivosidenib resumed 12/05/2019  MRI abdomen 03/17/2019-stable to improved liver  lesions, no new lesions, stable portahepatis node, no evidence of disease progression  Ivosidenib continued 2. Left leg and right foot pain  Negative left leg Doppler 07/14/2016   3. Right abdominal pain-potentially related to the dominant right liver mass  4. Anorexia/weight loss  5. Benign appearing 16 mm cystic uncinatepancreas lesion noted on MRI 06/20/2016   Slightly larger on an MRI 03/08/2017, felt to most likely represent a side branch intraductal papillary mucinous neoplasm  Stable on MRI 10/09/2017  Stable on MRI 01/08/2018  Stable on MRI 04/03/2018  Stable on MRI 07/03/2018  Stable on MRI 11/20/2018  Stable on MRI 01/2019  Stable on MRI 03/17/2019  6. Anemia-iron deficiency  7. Port-A-Cath placement interventional radiology 03/04/2019  8.Neutropenia secondary to chemotherapy-gemcitabineand cisplatindose reducedbeginning with cycle 2; mild neutropenia and thrombocytopenia 04/22/2019. Gemcitabine and cisplatin held. Treatment changed to every 3 weeks beginning on 05/14/2019.     Disposition: Laura [name] appears stable.  She continues to tolerate the Ivosidenib well.  The restaging MRI reveals stable to improved liver lesions.  No evidence of disease progression.  She will continue the current treatment.  I reviewed the MRI images with her.  Ms. Scaff will return for an office and lab visit in 6 weeks.  The chemistry panel and EKG were reviewed today.  [name], MD  03/17/2020  11:26 AM

## 2020-03-25 ENCOUNTER — Other Ambulatory Visit: Payer: Self-pay | Admitting: Oncology

## 2020-03-25 DIAGNOSIS — C221 Intrahepatic bile duct carcinoma: Secondary | ICD-10-CM

## 2020-04-07 ENCOUNTER — Other Ambulatory Visit: Payer: Self-pay | Admitting: Oncology

## 2020-04-07 DIAGNOSIS — C221 Intrahepatic bile duct carcinoma: Secondary | ICD-10-CM

## 2020-04-14 DIAGNOSIS — H25812 Combined forms of age-related cataract, left eye: Secondary | ICD-10-CM | POA: Diagnosis not present

## 2020-04-14 DIAGNOSIS — H2512 Age-related nuclear cataract, left eye: Secondary | ICD-10-CM | POA: Diagnosis not present

## 2020-04-26 ENCOUNTER — Inpatient Hospital Stay: Payer: PPO

## 2020-04-26 ENCOUNTER — Other Ambulatory Visit: Payer: PPO

## 2020-04-26 ENCOUNTER — Telehealth: Payer: Self-pay | Admitting: Nurse Practitioner

## 2020-04-26 ENCOUNTER — Encounter: Payer: Self-pay | Admitting: Nurse Practitioner

## 2020-04-26 ENCOUNTER — Other Ambulatory Visit: Payer: Self-pay

## 2020-04-26 ENCOUNTER — Ambulatory Visit: Payer: PPO | Admitting: Nurse Practitioner

## 2020-04-26 ENCOUNTER — Inpatient Hospital Stay: Payer: PPO | Attending: Oncology

## 2020-04-26 ENCOUNTER — Inpatient Hospital Stay: Payer: PPO | Admitting: Nurse Practitioner

## 2020-04-26 VITALS — BP 132/64 | HR 74 | Temp 98.5°F | Resp 18 | Ht 65.0 in | Wt 137.7 lb

## 2020-04-26 DIAGNOSIS — C221 Intrahepatic bile duct carcinoma: Secondary | ICD-10-CM | POA: Insufficient documentation

## 2020-04-26 DIAGNOSIS — R531 Weakness: Secondary | ICD-10-CM | POA: Insufficient documentation

## 2020-04-26 DIAGNOSIS — Z95828 Presence of other vascular implants and grafts: Secondary | ICD-10-CM

## 2020-04-26 DIAGNOSIS — D509 Iron deficiency anemia, unspecified: Secondary | ICD-10-CM | POA: Diagnosis not present

## 2020-04-26 LAB — CBC WITH DIFFERENTIAL (CANCER CENTER ONLY)
Abs Immature Granulocytes: 0.01 10*3/uL (ref 0.00–0.07)
Basophils Absolute: 0 10*3/uL (ref 0.0–0.1)
Basophils Relative: 1 %
Eosinophils Absolute: 0 10*3/uL (ref 0.0–0.5)
Eosinophils Relative: 1 %
HCT: 35.6 % — ABNORMAL LOW (ref 36.0–46.0)
Hemoglobin: 12.2 g/dL (ref 12.0–15.0)
Immature Granulocytes: 0 %
Lymphocytes Relative: 22 %
Lymphs Abs: 0.7 10*3/uL (ref 0.7–4.0)
MCH: 32.7 pg (ref 26.0–34.0)
MCHC: 34.3 g/dL (ref 30.0–36.0)
MCV: 95.4 fL (ref 80.0–100.0)
Monocytes Absolute: 0.4 10*3/uL (ref 0.1–1.0)
Monocytes Relative: 13 %
Neutro Abs: 1.8 10*3/uL (ref 1.7–7.7)
Neutrophils Relative %: 63 %
Platelet Count: 162 10*3/uL (ref 150–400)
RBC: 3.73 MIL/uL — ABNORMAL LOW (ref 3.87–5.11)
RDW: 12.1 % (ref 11.5–15.5)
WBC Count: 3 10*3/uL — ABNORMAL LOW (ref 4.0–10.5)
nRBC: 0 % (ref 0.0–0.2)

## 2020-04-26 LAB — CMP (CANCER CENTER ONLY)
ALT: 11 U/L (ref 0–44)
AST: 19 U/L (ref 15–41)
Albumin: 3.8 g/dL (ref 3.5–5.0)
Alkaline Phosphatase: 107 U/L (ref 38–126)
Anion gap: 11 (ref 5–15)
BUN: 9 mg/dL (ref 8–23)
CO2: 27 mmol/L (ref 22–32)
Calcium: 9.5 mg/dL (ref 8.9–10.3)
Chloride: 106 mmol/L (ref 98–111)
Creatinine: 0.74 mg/dL (ref 0.44–1.00)
GFR, Est AFR Am: >60 mL/min (ref >60)
GFR, Estimated: >60 mL/min (ref >60)
Glucose, Bld: 106 mg/dL — ABNORMAL HIGH (ref 70–99)
Potassium: 3.7 mmol/L (ref 3.5–5.1)
Sodium: 144 mmol/L (ref 135–145)
Total Bilirubin: 0.5 mg/dL (ref 0.3–1.2)
Total Protein: 6.7 g/dL (ref 6.5–8.1)

## 2020-04-26 LAB — MAGNESIUM: Magnesium: 2 mg/dL (ref 1.7–2.4)

## 2020-04-26 MED ORDER — HEPARIN SOD (PORK) LOCK FLUSH 100 UNIT/ML IV SOLN
500.0000 [IU] | Freq: Once | INTRAVENOUS | Status: AC
  Administered 2020-04-26: 500 [IU]
  Filled 2020-04-26: qty 5

## 2020-04-26 MED ORDER — SODIUM CHLORIDE 0.9% FLUSH
10.0000 mL | Freq: Once | INTRAVENOUS | Status: AC
  Administered 2020-04-26: 10 mL
  Filled 2020-04-26: qty 10

## 2020-04-26 NOTE — Telephone Encounter (Signed)
Scheduled appt per 5/3 los - gave patient AVS and calender per los   

## 2020-04-26 NOTE — Progress Notes (Signed)
[facility] OFFICE PROGRESS NOTE   Diagnosis: Cholangiocarcinoma  INTERVAL HISTORY:   Laura [name] returns as scheduled.  She continues Ivosidenib.  She denies nausea/vomiting.  No mouth sores.  No diarrhea.  No fever, cough, shortness of breath.  She has noticed bilateral leg weakness the past several months.  No numbness.  No back pain.  No bowel or bladder dysfunction.  Objective:  Vital signs in last 24 hours:  Blood pressure 132/64, pulse 74, temperature 98.5 F (36.9 C), temperature source Temporal, resp. rate 18, height '5\' 5"'  (1.651 m), weight 137 lb 11.2 oz (62.5 kg), SpO2 100 %.    HEENT: No thrush or ulcers. Resp: Lungs clear bilaterally. Cardio: Regular rate and rhythm. GI: Abdomen soft and nontender.  No hepatomegaly. Vascular: No leg edema.  Calves soft and nontender. Neuro: Lower extremity motor strength 5/5.  Knee DTRs 2+, symmetric. Skin: No rash. Port-A-Cath without erythema.   Lab Results:  Lab Results  Component Value Date   WBC 3.0 (L) 04/26/2020   HGB 12.2 04/26/2020   HCT 35.6 (L) 04/26/2020   MCV 95.4 04/26/2020   PLT 162 04/26/2020   NEUTROABS 1.8 04/26/2020    Imaging:  No results found.  Medications: I have reviewed the patient's current medications.  Assessment/Plan: 1. Right liver mass-biopsy 07/10/2016 confirmed adenocarcinoma, PDL 1 0%, MSS, tumor mutation burden 3, IDH1 and PIK3 CA alterations  CT abdomen/pelvis 06/17/2016 and MRI abdomen 06/21/2016 confirmed an isolated right liver mass and a cystic pancreas lesion  PET scan 07/28/2016-the hepatic lesion is not hypermetabolic, hypermetabolic lesion in the gastric antrum suspicious for a primary neoplasm and no other evidence of metastatic disease  Upper endoscopy 08/03/2016-no mass found, biopsy from the gastric antrum-benign  CT-guided ablation of the right liver lesion 11/03/2016  MRI abdomen 03/08/2017-ablation defect in the right liver without  residual/recurrent disease  MRI liver 06/11/2017-multiple new enhancing lesions in the right hepatic lobe surrounding the ablation defect consistent with progressive cholangiocarcinoma  Y-90 radioembolization of the right liver 07/19/2017  MRI abdomen 10/09/2017-suspected progression of intrahepatic larger carcinoma within segment 6, reviewed by interventional radiology most consistent with pseudo-progression  MRI abdomen 01/08/2018-slight decrease in the segment 6 mass, no change in enhancement at the ablation bed, new tiny arterial phase area of hyperenhancement in the lateral left liver-potentially a vascular phenomena  MRI abdomen 04/03/2018-stable segment 6 mass, no change in 10 mm flash filling segment 3 lesion-potentially a vascular shunt, a porta hepatis node measures 2.2 cm compared to 1.5 cm, no new lymphadenopathy  MRI abdomen 07/03/2018 stable segment 6 liver mass, stable segment 3 arterial phase enhancing lesion stable porta hepatis adenopathy, stable pancreas cystic lesion  MRI abdomen 11/20/2018-new 2.1 cm lateral left liver lesion, new 8 mm medial left liver lesion, no change in posterior right hepatic mass, no change in necrotic hepato-duodenal ligament lymph node no change in cystic pancreas lesion  Y 90 of the left liver 01/16/2019  Cycle 1 gemcitabine/cisplatin 03/12/2019  Cycle 2 gemcitabine/cisplatin 04/09/2019 (doses reduced due to neutropenia)  Cycle 3 gemcitabine/cisplatin 05/07/2019  Cycle 4 gemcitabine/cisplatin 05/28/2019-Neulasta not given  Cycle 5 gemcitabine/cisplatin 06/18/2019  MRI abdomen 07/08/2019-progressive liver metastases  Ivosidenib 07/19/2019  MRI abdomen 10/27/2019-multiple hepatic lesions-grossly unchanged, stable to mildly improved porta hepatis node  Ivosidenib placed on hold 11/28/2019 due to QTc interval 488  Ivosidenib resumed 12/05/2019  MRI abdomen 03/17/2019-stable to improved liver lesions, no new lesions, stable portahepatis node, no  evidence of disease progression  Ivosidenib continued 2. Left  leg and right foot pain  Negative left leg Doppler 07/14/2016   3. Right abdominal pain-potentially related to the dominant right liver mass  4. Anorexia/weight loss  5. Benign appearing 16 mm cystic uncinatepancreas lesion noted on MRI 06/20/2016   Slightly larger on an MRI 03/08/2017, felt to most likely represent a side branch intraductal papillary mucinous neoplasm  Stable on MRI 10/09/2017  Stable on MRI 01/08/2018  Stable on MRI 04/03/2018  Stable on MRI 07/03/2018  Stable on MRI 11/20/2018  Stable on MRI 01/2019  Stable on MRI 03/17/2019  6. Anemia-iron deficiency  7. Port-A-Cath placement interventional radiology 03/04/2019  8.Neutropenia secondary to chemotherapy-gemcitabineand cisplatindose reducedbeginning with cycle 2; mild neutropenia and thrombocytopenia 04/22/2019. Gemcitabine and cisplatin held. Treatment changed to every 3 weeks beginning on 05/14/2019.    Disposition: Laura [name] appears well.  There is no clinical evidence of disease progression.  She will continue Ivosidenib.  We reviewed the CBC, chemistry panel and magnesium from today.  We reviewed the EKG.    At today's visit she reports leg weakness for several months.  Motor strength is intact on exam today.  She will contact the office if the leg weakness progresses or she develops any other concerning symptoms.  She will return for lab, EKG, follow-up in 6 weeks.  She will contact the office in the interim with any problems.  Plan reviewed with Dr. Benay Spice.  Ned Card ANP/GNP-BC   04/26/2020  9:49 AM

## 2020-05-12 DIAGNOSIS — H2511 Age-related nuclear cataract, right eye: Secondary | ICD-10-CM | POA: Diagnosis not present

## 2020-05-12 DIAGNOSIS — H25811 Combined forms of age-related cataract, right eye: Secondary | ICD-10-CM | POA: Diagnosis not present

## 2020-06-07 ENCOUNTER — Other Ambulatory Visit: Payer: Self-pay

## 2020-06-07 ENCOUNTER — Other Ambulatory Visit: Payer: Self-pay | Admitting: Oncology

## 2020-06-07 ENCOUNTER — Inpatient Hospital Stay: Payer: PPO | Attending: Oncology | Admitting: Oncology

## 2020-06-07 ENCOUNTER — Inpatient Hospital Stay: Payer: PPO

## 2020-06-07 ENCOUNTER — Telehealth: Payer: Self-pay | Admitting: Oncology

## 2020-06-07 DIAGNOSIS — C221 Intrahepatic bile duct carcinoma: Secondary | ICD-10-CM

## 2020-06-07 DIAGNOSIS — Z452 Encounter for adjustment and management of vascular access device: Secondary | ICD-10-CM | POA: Insufficient documentation

## 2020-06-07 DIAGNOSIS — D509 Iron deficiency anemia, unspecified: Secondary | ICD-10-CM | POA: Diagnosis not present

## 2020-06-07 DIAGNOSIS — Z95828 Presence of other vascular implants and grafts: Secondary | ICD-10-CM

## 2020-06-07 LAB — CBC WITH DIFFERENTIAL (CANCER CENTER ONLY)
Abs Immature Granulocytes: 0.01 10*3/uL (ref 0.00–0.07)
Basophils Absolute: 0 10*3/uL (ref 0.0–0.1)
Basophils Relative: 1 %
Eosinophils Absolute: 0 10*3/uL (ref 0.0–0.5)
Eosinophils Relative: 1 %
HCT: 36.5 % (ref 36.0–46.0)
Hemoglobin: 12.5 g/dL (ref 12.0–15.0)
Immature Granulocytes: 0 %
Lymphocytes Relative: 27 %
Lymphs Abs: 0.8 10*3/uL (ref 0.7–4.0)
MCH: 33.1 pg (ref 26.0–34.0)
MCHC: 34.2 g/dL (ref 30.0–36.0)
MCV: 96.6 fL (ref 80.0–100.0)
Monocytes Absolute: 0.4 10*3/uL (ref 0.1–1.0)
Monocytes Relative: 13 %
Neutro Abs: 1.6 10*3/uL — ABNORMAL LOW (ref 1.7–7.7)
Neutrophils Relative %: 58 %
Platelet Count: 172 10*3/uL (ref 150–400)
RBC: 3.78 MIL/uL — ABNORMAL LOW (ref 3.87–5.11)
RDW: 12 % (ref 11.5–15.5)
WBC Count: 2.8 10*3/uL — ABNORMAL LOW (ref 4.0–10.5)
nRBC: 0 % (ref 0.0–0.2)

## 2020-06-07 LAB — CMP (CANCER CENTER ONLY)
ALT: 11 U/L (ref 0–44)
AST: 18 U/L (ref 15–41)
Albumin: 3.8 g/dL (ref 3.5–5.0)
Alkaline Phosphatase: 101 U/L (ref 38–126)
Anion gap: 9 (ref 5–15)
BUN: 11 mg/dL (ref 8–23)
CO2: 27 mmol/L (ref 22–32)
Calcium: 9.9 mg/dL (ref 8.9–10.3)
Chloride: 105 mmol/L (ref 98–111)
Creatinine: 0.78 mg/dL (ref 0.44–1.00)
GFR, Est AFR Am: >60 mL/min (ref >60)
GFR, Estimated: >60 mL/min (ref >60)
Glucose, Bld: 107 mg/dL — ABNORMAL HIGH (ref 70–99)
Potassium: 3.8 mmol/L (ref 3.5–5.1)
Sodium: 141 mmol/L (ref 135–145)
Total Bilirubin: 0.5 mg/dL (ref 0.3–1.2)
Total Protein: 6.7 g/dL (ref 6.5–8.1)

## 2020-06-07 LAB — MAGNESIUM: Magnesium: 1.9 mg/dL (ref 1.7–2.4)

## 2020-06-07 MED ORDER — HEPARIN SOD (PORK) LOCK FLUSH 100 UNIT/ML IV SOLN
500.0000 [IU] | Freq: Once | INTRAVENOUS | Status: AC
  Administered 2020-06-07: 500 [IU]
  Filled 2020-06-07: qty 5

## 2020-06-07 MED ORDER — SODIUM CHLORIDE 0.9% FLUSH
10.0000 mL | Freq: Once | INTRAVENOUS | Status: AC
  Administered 2020-06-07: 10 mL
  Filled 2020-06-07: qty 10

## 2020-06-07 MED ORDER — PROCHLORPERAZINE MALEATE 5 MG PO TABS: ORAL_TABLET | ORAL | 1 refills | Status: DC

## 2020-06-07 NOTE — Progress Notes (Signed)
[facility] OFFICE PROGRESS NOTE   Diagnosis: Cholangiocarcinoma  INTERVAL HISTORY:   Laura [name] returns as scheduled.  She continues treatment with Ivosidenib.  She feels well.  Good appetite.  No pain.  She takes Compazine each morning and has no nausea.  No new complaint.  Objective:  Vital signs in last 24 hours:  Blood pressure (!) 141/61, pulse 83, temperature 97.8 F (36.6 C), temperature source Temporal, resp. rate 18, height '5\' 5"'$  (1.651 m), weight 139 lb 1.6 oz (63.1 kg), SpO2 98 %.    HEENT: No thrush or ulcers Lymphatics: No cervical or supraclavicular nodes Resp: Lungs clear bilaterally Cardio: Regular rate and rhythm GI: No hepatosplenomegaly, no mass, nontender Vascular: No leg edema   Portacath/PICC-without erythema  Lab Results:  Lab Results  Component Value Date   WBC 2.8 (L) 06/07/2020   HGB 12.5 06/07/2020   HCT 36.5 06/07/2020   MCV 96.6 06/07/2020   PLT 172 06/07/2020   NEUTROABS 1.6 (L) 06/07/2020    CMP  Lab Results  Component Value Date   NA 141 06/07/2020   K 3.8 06/07/2020   CL 105 06/07/2020   CO2 27 06/07/2020   GLUCOSE 107 (H) 06/07/2020   BUN 11 06/07/2020   CREATININE 0.78 06/07/2020   CALCIUM 9.9 06/07/2020   PROT 6.7 06/07/2020   ALBUMIN 3.8 06/07/2020   AST 18 06/07/2020   ALT 11 06/07/2020   ALKPHOS 101 06/07/2020   BILITOT 0.5 06/07/2020   GFRNONAA >60 06/07/2020   GFRAA >60 06/07/2020    Lab Results  Component Value Date   CEA1 <1.00 02/21/2019    EKG: Normal sinus rhythm, QTC 450 Medications: I have reviewed the patient's current medications.   Assessment/Plan: 1. Right liver mass-biopsy 07/10/2016 confirmed adenocarcinoma, PDL 1 0%, MSS, tumor mutation burden 3, IDH1 and PIK3 CA alterations  CT abdomen/pelvis 06/17/2016 and MRI abdomen 06/21/2016 confirmed an isolated right liver mass and a cystic pancreas lesion  PET scan 07/28/2016-the hepatic lesion is not hypermetabolic,  hypermetabolic lesion in the gastric antrum suspicious for a primary neoplasm and no other evidence of metastatic disease  Upper endoscopy 08/03/2016-no mass found, biopsy from the gastric antrum-benign  CT-guided ablation of the right liver lesion 11/03/2016  MRI abdomen 03/08/2017-ablation defect in the right liver without residual/recurrent disease  MRI liver 06/11/2017-multiple new enhancing lesions in the right hepatic lobe surrounding the ablation defect consistent with progressive cholangiocarcinoma  Y-90 radioembolization of the right liver 07/19/2017  MRI abdomen 10/09/2017-suspected progression of intrahepatic larger carcinoma within segment 6, reviewed by interventional radiology most consistent with pseudo-progression  MRI abdomen 01/08/2018-slight decrease in the segment 6 mass, no change in enhancement at the ablation bed, new tiny arterial phase area of hyperenhancement in the lateral left liver-potentially a vascular phenomena  MRI abdomen 04/03/2018-stable segment 6 mass, no change in 10 mm flash filling segment 3 lesion-potentially a vascular shunt, a porta hepatis node measures 2.2 cm compared to 1.5 cm, no new lymphadenopathy  MRI abdomen 07/03/2018 stable segment 6 liver mass, stable segment 3 arterial phase enhancing lesion stable porta hepatis adenopathy, stable pancreas cystic lesion  MRI abdomen 11/20/2018-new 2.1 cm lateral left liver lesion, new 8 mm medial left liver lesion, no change in posterior right hepatic mass, no change in necrotic hepato-duodenal ligament lymph node no change in cystic pancreas lesion  Y 90 of the left liver 01/16/2019  Cycle 1 gemcitabine/cisplatin 03/12/2019  Cycle 2 gemcitabine/cisplatin 04/09/2019 (doses reduced due to neutropenia)  Cycle 3  gemcitabine/cisplatin 05/07/2019  Cycle 4 gemcitabine/cisplatin 05/28/2019-Neulasta not given  Cycle 5 gemcitabine/cisplatin 06/18/2019  MRI abdomen 07/08/2019-progressive liver  metastases  Ivosidenib 07/19/2019  MRI abdomen 10/27/2019-multiple hepatic lesions-grossly unchanged, stable to mildly improved porta hepatis node  Ivosidenib placed on hold 11/28/2019 due to QTc interval 488  Ivosidenib resumed 12/05/2019  MRI abdomen 03/17/2019-stable to improved liver lesions, no new lesions, stable portahepatis node, no evidence of disease progression  Ivosidenib continued 2. Left leg and right foot pain  Negative left leg Doppler 07/14/2016   3. Right abdominal pain-potentially related to the dominant right liver mass  4. Anorexia/weight loss  5. Benign appearing 16 mm cystic uncinatepancreas lesion noted on MRI 06/20/2016   Slightly larger on an MRI 03/08/2017, felt to most likely represent a side branch intraductal papillary mucinous neoplasm  Stable on MRI 10/09/2017  Stable on MRI 01/08/2018  Stable on MRI 04/03/2018  Stable on MRI 07/03/2018  Stable on MRI 11/20/2018  Stable on MRI 01/2019  Stable on MRI 03/17/2019  6. Anemia-iron deficiency  7. Port-A-Cath placement interventional radiology 03/04/2019  8.Neutropenia secondary to chemotherapy-gemcitabineand cisplatindose reducedbeginning with cycle 2; mild neutropenia and thrombocytopenia 04/22/2019. Gemcitabine and cisplatin held. Treatment changed to every 3 weeks beginning on 05/14/2019.    Disposition: Laura [name] appears stable.  She continues to tolerate the Ivosidenib well.  We will plan for a restaging MRI at a 4-31-monthinterval.  She will return for an office visit in 5 weeks.  GBetsy Coder MD  06/07/2020  9:09 AM

## 2020-06-07 NOTE — Telephone Encounter (Signed)
Scheduled per 06/14 los, patient has received printed calender.

## 2020-06-18 ENCOUNTER — Other Ambulatory Visit: Payer: Self-pay | Admitting: Oncology

## 2020-06-18 DIAGNOSIS — C221 Intrahepatic bile duct carcinoma: Secondary | ICD-10-CM

## 2020-06-21 ENCOUNTER — Telehealth: Payer: Self-pay | Admitting: Pharmacy Technician

## 2020-06-21 ENCOUNTER — Telehealth: Payer: Self-pay | Admitting: Pharmacist

## 2020-06-21 DIAGNOSIS — C221 Intrahepatic bile duct carcinoma: Secondary | ICD-10-CM

## 2020-06-21 MED ORDER — TIBSOVO 250 MG PO TABS: 2.0000 | ORAL_TABLET | Freq: Every day | ORAL | 0 refills | Status: DC

## 2020-06-21 NOTE — Telephone Encounter (Signed)
Oral Oncology Patient Advocate Encounter   Was successful in securing patient a $8,000 grant from [place] to provide copayment coverage for Tibsovo.  This will keep the out of pocket expense at $0.     I have spoken with the patient.    The billing information is as follows and has been shared with [place].   Member ID: [id number] Group ID: [id number] RxBin: [id number] PCN: [id number] Dates of Eligibility: 06/21/20 through 06/21/21  Fund name:  [place] Patient [place] Phone [phone] Fax [phone] 06/21/2020 3:23 PM

## 2020-06-21 NOTE — Telephone Encounter (Signed)
Oral Oncology Patient Advocate Encounter   Was successful in securing patient an $12,200 grant from [facility] [place] and [facility]) to provide copayment coverage for Tibsovo.  This will keep the out of pocket expense at $0.     I have spoken with the patient.    The billing information is as follows and has been shared with [place].   Member ID: [id number] Group ID: [id number] RxBin: [id number] Dates of Eligibility: 03/23/20 through 06/20/21  Fund:  [place] Patient [place] Phone [phone] Fax [phone] 06/21/2020 3:22 PM

## 2020-06-21 NOTE — Telephone Encounter (Signed)
Oral Chemotherapy Pharmacist Encounter   Received a call from [place] assistance program that because there is grant funding currently open for Laura [name]'s disease state they will no longer provider medication to her through their program. If she using all of the available grant money she can reapply to assistance at that time.  Available grants: PANF (bilary tract cancer) and [employer] (cholangiocarcinoma). Laura [name], patient advocate is applying for these grants for [name].  Patient's current insurance authorization for this will expire on 07/08/20 at which time she will need a reauthorization.  Prescription refill redirected to [facility]. [place] reported having about one week of medication on hand. Will will contact her to get her set-up with [facility] for medication delivery.  Darl Pikes, PharmD, BCPS, BCOP, CPP Hematology/Oncology Clinical Pharmacist [facility]/HP/AP [facility] [phone]  06/21/2020 3:18 PM

## 2020-06-22 MED FILL — TIBSOVO 250 MG TABS: 250 | 30 days supply | Qty: 60 | Fill #0

## 2020-06-25 ENCOUNTER — Encounter: Payer: Self-pay | Admitting: Oncology

## 2020-06-25 NOTE — Progress Notes (Signed)
Patient called regarding letter received from [place].  Advised that was a copy for her records.  She has my card for any additional financial questions or concerns.

## 2020-06-29 ENCOUNTER — Other Ambulatory Visit: Payer: Self-pay | Admitting: Oncology

## 2020-06-29 DIAGNOSIS — C221 Intrahepatic bile duct carcinoma: Secondary | ICD-10-CM

## 2020-07-01 ENCOUNTER — Other Ambulatory Visit: Payer: Self-pay | Admitting: *Deleted

## 2020-07-01 DIAGNOSIS — C221 Intrahepatic bile duct carcinoma: Secondary | ICD-10-CM

## 2020-07-01 MED ORDER — CLONAZEPAM 0.5 MG PO TABS: 0.7500 mg | ORAL_TABLET | Freq: Two times a day (BID) | ORAL | 0 refills | Status: DC | PRN

## 2020-07-01 NOTE — Progress Notes (Signed)
Informed patient that the refill request was received from [facility] this morning and has been refilled

## 2020-07-12 ENCOUNTER — Telehealth: Payer: Self-pay | Admitting: Oncology

## 2020-07-12 ENCOUNTER — Inpatient Hospital Stay: Payer: PPO | Attending: Oncology

## 2020-07-12 ENCOUNTER — Other Ambulatory Visit: Payer: Self-pay

## 2020-07-12 ENCOUNTER — Inpatient Hospital Stay: Payer: PPO

## 2020-07-12 ENCOUNTER — Inpatient Hospital Stay: Payer: PPO | Admitting: Oncology

## 2020-07-12 VITALS — BP 141/72 | HR 85 | Temp 97.8°F | Resp 17 | Ht 65.0 in | Wt 139.2 lb

## 2020-07-12 DIAGNOSIS — Z95828 Presence of other vascular implants and grafts: Secondary | ICD-10-CM

## 2020-07-12 DIAGNOSIS — Z452 Encounter for adjustment and management of vascular access device: Secondary | ICD-10-CM | POA: Diagnosis not present

## 2020-07-12 DIAGNOSIS — C221 Intrahepatic bile duct carcinoma: Secondary | ICD-10-CM

## 2020-07-12 DIAGNOSIS — R5381 Other malaise: Secondary | ICD-10-CM | POA: Diagnosis not present

## 2020-07-12 DIAGNOSIS — Z9221 Personal history of antineoplastic chemotherapy: Secondary | ICD-10-CM | POA: Diagnosis not present

## 2020-07-12 LAB — CBC WITH DIFFERENTIAL (CANCER CENTER ONLY)
Abs Immature Granulocytes: 0.01 10*3/uL (ref 0.00–0.07)
Basophils Absolute: 0 10*3/uL (ref 0.0–0.1)
Basophils Relative: 1 %
Eosinophils Absolute: 0 10*3/uL (ref 0.0–0.5)
Eosinophils Relative: 1 %
HCT: 37.2 % (ref 36.0–46.0)
Hemoglobin: 12.6 g/dL (ref 12.0–15.0)
Immature Granulocytes: 0 %
Lymphocytes Relative: 25 %
Lymphs Abs: 0.7 10*3/uL (ref 0.7–4.0)
MCH: 32.3 pg (ref 26.0–34.0)
MCHC: 33.9 g/dL (ref 30.0–36.0)
MCV: 95.4 fL (ref 80.0–100.0)
Monocytes Absolute: 0.3 10*3/uL (ref 0.1–1.0)
Monocytes Relative: 12 %
Neutro Abs: 1.8 10*3/uL (ref 1.7–7.7)
Neutrophils Relative %: 61 %
Platelet Count: 172 10*3/uL (ref 150–400)
RBC: 3.9 MIL/uL (ref 3.87–5.11)
RDW: 12.1 % (ref 11.5–15.5)
WBC Count: 2.9 10*3/uL — ABNORMAL LOW (ref 4.0–10.5)
nRBC: 0 % (ref 0.0–0.2)

## 2020-07-12 LAB — MAGNESIUM: Magnesium: 1.9 mg/dL (ref 1.7–2.4)

## 2020-07-12 LAB — CMP (CANCER CENTER ONLY)
ALT: 9 U/L (ref 0–44)
AST: 18 U/L (ref 15–41)
Albumin: 3.8 g/dL (ref 3.5–5.0)
Alkaline Phosphatase: 109 U/L (ref 38–126)
Anion gap: 8 (ref 5–15)
BUN: 12 mg/dL (ref 8–23)
CO2: 27 mmol/L (ref 22–32)
Calcium: 9.9 mg/dL (ref 8.9–10.3)
Chloride: 105 mmol/L (ref 98–111)
Creatinine: 0.75 mg/dL (ref 0.44–1.00)
GFR, Est AFR Am: >60 mL/min (ref >60)
GFR, Estimated: >60 mL/min (ref >60)
Glucose, Bld: 144 mg/dL — ABNORMAL HIGH (ref 70–99)
Potassium: 3.8 mmol/L (ref 3.5–5.1)
Sodium: 140 mmol/L (ref 135–145)
Total Bilirubin: 0.5 mg/dL (ref 0.3–1.2)
Total Protein: 6.8 g/dL (ref 6.5–8.1)

## 2020-07-12 LAB — PHOSPHORUS: Phosphorus: 3.2 mg/dL (ref 2.5–4.6)

## 2020-07-12 MED ORDER — HEPARIN SOD (PORK) LOCK FLUSH 100 UNIT/ML IV SOLN
500.0000 [IU] | Freq: Once | INTRAVENOUS | Status: DC
  Filled 2020-07-12: qty 5

## 2020-07-12 MED ORDER — SODIUM CHLORIDE 0.9% FLUSH
10.0000 mL | Freq: Once | INTRAVENOUS | Status: AC
  Administered 2020-07-12: 10 mL
  Filled 2020-07-12: qty 10

## 2020-07-12 NOTE — Telephone Encounter (Signed)
Scheduled per 7/19 los. Printed avs and calendar for pt.  

## 2020-07-12 NOTE — Progress Notes (Signed)
[place] [facility] OFFICE PROGRESS NOTE   Diagnosis: Cholangiocarcinoma  INTERVAL HISTORY:   Laura [name] returns for a scheduled visit.  She continues Ivosidenib.  No fever, rash, or diarrhea.  She has a good appetite.  She complains of malaise for the past 2 to 3 weeks.  No cough or dyspnea.  She has to limit her activities secondary to malaise.  She is here with her daughter.  Objective:  Vital signs in last 24 hours:  Blood pressure (!) 141/72, pulse 85, temperature 97.8 F (36.6 C), temperature source Temporal, resp. rate 17, height '5\' 5"'  (1.651 m), weight 139 lb 3.2 oz (63.1 kg), SpO2 98 %.    HEENT: No thrush or ulcers Resp: Lungs with end inspiratory rhonchi at the left greater than right posterior chest, no respiratory distress Cardio: Regular rate and rhythm GI: No hepatosplenomegaly, no mass, nontender Vascular: No leg edema   Portacath/PICC-without erythema  Lab Results:  Lab Results  Component Value Date   WBC 2.9 (L) 07/12/2020   HGB 12.6 07/12/2020   HCT 37.2 07/12/2020   MCV 95.4 07/12/2020   PLT 172 07/12/2020   NEUTROABS 1.8 07/12/2020    CMP  Lab Results  Component Value Date   NA 140 07/12/2020   K 3.8 07/12/2020   CL 105 07/12/2020   CO2 27 07/12/2020   GLUCOSE 144 (H) 07/12/2020   BUN 12 07/12/2020   CREATININE 0.75 07/12/2020   CALCIUM 9.9 07/12/2020   PROT 6.8 07/12/2020   ALBUMIN 3.8 07/12/2020   AST 18 07/12/2020   ALT 9 07/12/2020   ALKPHOS 109 07/12/2020   BILITOT 0.5 07/12/2020   GFRNONAA >60 07/12/2020   GFRAA >60 07/12/2020    Lab Results  Component Value Date   CEA1 <1.00 02/21/2019    Lab Results  Component Value Date   INR 0.9 03/04/2019     Medications: I have reviewed the patient's current medications.   Assessment/Plan: 1. Right liver mass-biopsy 07/10/2016 confirmed adenocarcinoma, PDL 1 0%, MSS, tumor mutation burden 3, IDH1 and PIK3 CA alterations  CT abdomen/pelvis 06/17/2016 and MRI abdomen  06/21/2016 confirmed an isolated right liver mass and a cystic pancreas lesion  PET scan 07/28/2016-the hepatic lesion is not hypermetabolic, hypermetabolic lesion in the gastric antrum suspicious for a primary neoplasm and no other evidence of metastatic disease  Upper endoscopy 08/03/2016-no mass found, biopsy from the gastric antrum-benign  CT-guided ablation of the right liver lesion 11/03/2016  MRI abdomen 03/08/2017-ablation defect in the right liver without residual/recurrent disease  MRI liver 06/11/2017-multiple new enhancing lesions in the right hepatic lobe surrounding the ablation defect consistent with progressive cholangiocarcinoma  Y-90 radioembolization of the right liver 07/19/2017  MRI abdomen 10/09/2017-suspected progression of intrahepatic larger carcinoma within segment 6, reviewed by interventional radiology most consistent with pseudo-progression  MRI abdomen 01/08/2018-slight decrease in the segment 6 mass, no change in enhancement at the ablation bed, new tiny arterial phase area of hyperenhancement in the lateral left liver-potentially a vascular phenomena  MRI abdomen 04/03/2018-stable segment 6 mass, no change in 10 mm flash filling segment 3 lesion-potentially a vascular shunt, a porta hepatis node measures 2.2 cm compared to 1.5 cm, no new lymphadenopathy  MRI abdomen 07/03/2018 stable segment 6 liver mass, stable segment 3 arterial phase enhancing lesion stable porta hepatis adenopathy, stable pancreas cystic lesion  MRI abdomen 11/20/2018-new 2.1 cm lateral left liver lesion, new 8 mm medial left liver lesion, no change in posterior right hepatic mass, no change in  necrotic hepato-duodenal ligament lymph node no change in cystic pancreas lesion  Y 90 of the left liver 01/16/2019  Cycle 1 gemcitabine/cisplatin 03/12/2019  Cycle 2 gemcitabine/cisplatin 04/09/2019 (doses reduced due to neutropenia)  Cycle 3 gemcitabine/cisplatin 05/07/2019  Cycle 4  gemcitabine/cisplatin 05/28/2019-Neulasta not given  Cycle 5 gemcitabine/cisplatin 06/18/2019  MRI abdomen 07/08/2019-progressive liver metastases  Ivosidenib 07/19/2019  MRI abdomen 10/27/2019-multiple hepatic lesions-grossly unchanged, stable to mildly improved porta hepatis node  Ivosidenib placed on hold 11/28/2019 due to QTc interval 488  Ivosidenib resumed 12/05/2019  MRI abdomen 03/16/2020-stable to improved liver lesions, no new lesions, stable portahepatis node, no evidence of disease progression  Ivosidenib continued 2. Left leg and right foot pain  Negative left leg Doppler 07/14/2016   3. Right abdominal pain-potentially related to the dominant right liver mass  4. Anorexia/weight loss  5. Benign appearing 16 mm cystic uncinatepancreas lesion noted on MRI 06/20/2016   Slightly larger on an MRI 03/08/2017, felt to most likely represent a side branch intraductal papillary mucinous neoplasm  Stable on MRI 10/09/2017  Stable on MRI 01/08/2018  Stable on MRI 04/03/2018  Stable on MRI 07/03/2018  Stable on MRI 11/20/2018  Stable on MRI 01/2019  Stable on MRI 03/17/2019  6. Anemia-iron deficiency  7. Port-A-Cath placement interventional radiology 03/04/2019  8.Neutropenia secondary to chemotherapy-gemcitabineand cisplatindose reducedbeginning with cycle 2; mild neutropenia and thrombocytopenia 04/22/2019. Gemcitabine and cisplatin held. Treatment changed to every 3 weeks beginning on 05/14/2019.    Disposition: Laura [name] appears unchanged, but she complains of increased malaise.  It is possible her symptoms are related to the Ivosidenib.  There is no other evidence of disease progression.  She does not have symptoms to suggest an infection or pneumonitis.  Laura [name] will continue the Ivosidenib.  She will undergo a restaging MRI prior to an office visit in 1 month.  She will contact us in the interim for increased malaise or new symptoms.   We will check a TSH.  [name], MD  07/12/2020  11:32 AM

## 2020-07-15 ENCOUNTER — Other Ambulatory Visit: Payer: Self-pay | Admitting: Oncology

## 2020-07-15 DIAGNOSIS — C221 Intrahepatic bile duct carcinoma: Secondary | ICD-10-CM

## 2020-07-21 MED FILL — TIBSOVO 250 MG TABS: 250 | 30 days supply | Qty: 60 | Fill #0

## 2020-08-04 ENCOUNTER — Other Ambulatory Visit: Payer: Self-pay | Admitting: Oncology

## 2020-08-04 DIAGNOSIS — C221 Intrahepatic bile duct carcinoma: Secondary | ICD-10-CM

## 2020-08-09 ENCOUNTER — Ambulatory Visit (HOSPITAL_COMMUNITY)
Admission: RE | Admit: 2020-08-09 | Discharge: 2020-08-09 | Disposition: A | Discharge status: Home / Self Care | Payer: PPO | Source: Ambulatory Visit | Attending: Oncology | Admitting: Oncology

## 2020-08-09 ENCOUNTER — Other Ambulatory Visit: Payer: Self-pay

## 2020-08-09 DIAGNOSIS — K7689 Other specified diseases of liver: Secondary | ICD-10-CM | POA: Diagnosis not present

## 2020-08-09 DIAGNOSIS — K862 Cyst of pancreas: Secondary | ICD-10-CM | POA: Diagnosis not present

## 2020-08-09 DIAGNOSIS — K8689 Other specified diseases of pancreas: Secondary | ICD-10-CM | POA: Diagnosis not present

## 2020-08-09 DIAGNOSIS — C221 Intrahepatic bile duct carcinoma: Secondary | ICD-10-CM | POA: Insufficient documentation

## 2020-08-09 DIAGNOSIS — N281 Cyst of kidney, acquired: Secondary | ICD-10-CM | POA: Diagnosis not present

## 2020-08-09 MED ORDER — GADOBUTROL 1 MMOL/ML IV SOLN
6.0000 mL | Freq: Once | INTRAVENOUS | Status: AC | PRN
  Administered 2020-08-09: 6 mL via INTRAVENOUS

## 2020-08-11 ENCOUNTER — Inpatient Hospital Stay: Payer: PPO

## 2020-08-11 ENCOUNTER — Other Ambulatory Visit: Payer: Self-pay

## 2020-08-11 ENCOUNTER — Telehealth: Payer: Self-pay | Admitting: Oncology

## 2020-08-11 ENCOUNTER — Inpatient Hospital Stay: Payer: PPO | Attending: Oncology | Admitting: Oncology

## 2020-08-11 VITALS — BP 135/61 | HR 77 | Temp 97.8°F | Resp 20 | Ht 65.0 in | Wt 139.4 lb

## 2020-08-11 DIAGNOSIS — C221 Intrahepatic bile duct carcinoma: Secondary | ICD-10-CM

## 2020-08-11 DIAGNOSIS — R5381 Other malaise: Secondary | ICD-10-CM | POA: Insufficient documentation

## 2020-08-11 DIAGNOSIS — R634 Abnormal weight loss: Secondary | ICD-10-CM | POA: Diagnosis not present

## 2020-08-11 DIAGNOSIS — Z452 Encounter for adjustment and management of vascular access device: Secondary | ICD-10-CM | POA: Insufficient documentation

## 2020-08-11 DIAGNOSIS — D509 Iron deficiency anemia, unspecified: Secondary | ICD-10-CM | POA: Diagnosis not present

## 2020-08-11 DIAGNOSIS — Z95828 Presence of other vascular implants and grafts: Secondary | ICD-10-CM

## 2020-08-11 DIAGNOSIS — Z79899 Other long term (current) drug therapy: Secondary | ICD-10-CM | POA: Insufficient documentation

## 2020-08-11 LAB — CBC WITH DIFFERENTIAL (CANCER CENTER ONLY)
Abs Immature Granulocytes: 0.01 10*3/uL (ref 0.00–0.07)
Basophils Absolute: 0 10*3/uL (ref 0.0–0.1)
Basophils Relative: 1 %
Eosinophils Absolute: 0 10*3/uL (ref 0.0–0.5)
Eosinophils Relative: 1 %
HCT: 37.3 % (ref 36.0–46.0)
Hemoglobin: 12.7 g/dL (ref 12.0–15.0)
Immature Granulocytes: 0 %
Lymphocytes Relative: 22 %
Lymphs Abs: 0.7 10*3/uL (ref 0.7–4.0)
MCH: 32.7 pg (ref 26.0–34.0)
MCHC: 34 g/dL (ref 30.0–36.0)
MCV: 96.1 fL (ref 80.0–100.0)
Monocytes Absolute: 0.4 10*3/uL (ref 0.1–1.0)
Monocytes Relative: 12 %
Neutro Abs: 2.1 10*3/uL (ref 1.7–7.7)
Neutrophils Relative %: 64 %
Platelet Count: 170 10*3/uL (ref 150–400)
RBC: 3.88 MIL/uL (ref 3.87–5.11)
RDW: 12 % (ref 11.5–15.5)
WBC Count: 3.3 10*3/uL — ABNORMAL LOW (ref 4.0–10.5)
nRBC: 0 % (ref 0.0–0.2)

## 2020-08-11 LAB — CMP (CANCER CENTER ONLY)
ALT: 7 U/L (ref 0–44)
AST: 17 U/L (ref 15–41)
Albumin: 3.8 g/dL (ref 3.5–5.0)
Alkaline Phosphatase: 124 U/L (ref 38–126)
Anion gap: 8 (ref 5–15)
BUN: 9 mg/dL (ref 8–23)
CO2: 28 mmol/L (ref 22–32)
Calcium: 10.3 mg/dL (ref 8.9–10.3)
Chloride: 104 mmol/L (ref 98–111)
Creatinine: 0.76 mg/dL (ref 0.44–1.00)
GFR, Est AFR Am: >60 mL/min (ref >60)
GFR, Estimated: >60 mL/min (ref >60)
Glucose, Bld: 103 mg/dL — ABNORMAL HIGH (ref 70–99)
Potassium: 3.8 mmol/L (ref 3.5–5.1)
Sodium: 140 mmol/L (ref 135–145)
Total Bilirubin: 0.5 mg/dL (ref 0.3–1.2)
Total Protein: 6.8 g/dL (ref 6.5–8.1)

## 2020-08-11 LAB — PHOSPHORUS: Phosphorus: 3.4 mg/dL (ref 2.5–4.6)

## 2020-08-11 MED ORDER — SODIUM CHLORIDE 0.9% FLUSH
10.0000 mL | Freq: Once | INTRAVENOUS | Status: DC
  Filled 2020-08-11: qty 10

## 2020-08-11 MED ORDER — HEPARIN SOD (PORK) LOCK FLUSH 100 UNIT/ML IV SOLN
500.0000 [IU] | Freq: Once | INTRAVENOUS | Status: DC
  Filled 2020-08-11: qty 5

## 2020-08-11 NOTE — Progress Notes (Signed)
Patient declines to take COVID vaccine.

## 2020-08-11 NOTE — Telephone Encounter (Signed)
scheduled per 8/18 los. Printed avs and calendar for pt.

## 2020-08-11 NOTE — Progress Notes (Signed)
[place] OFFICE PROGRESS NOTE   Diagnosis: Cholangiocarcinoma  INTERVAL HISTORY:   Laura [name] returns as scheduled.  She continues Ivosidenib.  She reports increased malaise.  A.m. nausea is relieved with Compazine.  No new complaint.  Objective:  Vital signs in last 24 hours:  Blood pressure 135/61, pulse 77, temperature 97.8 F (36.6 C), temperature source Temporal, resp. rate 20, height 5' 5" (1.651 m), weight 139 lb 6.4 oz (63.2 kg), SpO2 99 %.     Resp: Scattered end inspiratory rhonchi bilaterally, no respiratory distress Cardio: Regular rate and rhythm GI: No hepatosplenomegaly, no mass, nontender Vascular: No leg edema    Portacath/PICC-without erythema  Lab Results:  Lab Results  Component Value Date   WBC 3.3 (L) 08/11/2020   HGB 12.7 08/11/2020   HCT 37.3 08/11/2020   MCV 96.1 08/11/2020   PLT 170 08/11/2020   NEUTROABS 2.1 08/11/2020    CMP  Lab Results  Component Value Date   NA 140 08/11/2020   K 3.8 08/11/2020   CL 104 08/11/2020   CO2 28 08/11/2020   GLUCOSE 103 (H) 08/11/2020   BUN 9 08/11/2020   CREATININE 0.76 08/11/2020   CALCIUM 10.3 08/11/2020   PROT 6.8 08/11/2020   ALBUMIN 3.8 08/11/2020   AST 17 08/11/2020   ALT 7 08/11/2020   ALKPHOS 124 08/11/2020   BILITOT 0.5 08/11/2020   GFRNONAA >60 08/11/2020   GFRAA >60 08/11/2020    Lab Results  Component Value Date   CEA1 <1.00 02/21/2019    Imaging:  MR LIVER W WO CONTRAST  Result Date: 08/09/2020 CLINICAL DATA:  79 year old female with history of cholangiocarcinoma. Restaging examination. EXAM: MRI ABDOMEN WITHOUT AND WITH CONTRAST TECHNIQUE: Multiplanar multisequence MR imaging of the abdomen was performed both before and after the administration of intravenous contrast. CONTRAST:  48m GADAVIST GADOBUTROL 1 MMOL/ML IV SOLN COMPARISON:  Abdominal MRI 03/16/2020. FINDINGS: Lower chest: Unremarkable. Hepatobiliary: Multiple hepatic lesions are again noted  which are predominantly T1 hypointense, mildly T2 hyperintense, demonstrate diffusion restriction and demonstrate predominantly arterial phase hyperenhancement with persistence of enhancement on more delayed imaging. These appear similar number, but generally mildly increased in size compared to the prior examination. Specific examples include a 2.7 x 1.8 cm lesion between segments 4A and 8 (axial image 34 of series 15), previously 1.3 cm on 03/16/2020), a small peripheral lesion in segment 8 (axial image 34 of series 15) measuring 1.4 x 0.9 cm (previously 0.9 x 0.6 cm), and a lesion in the posterior aspect of segment 6 of the liver (axial image 47 of series 19) which currently measures 3.0 x 2.0 cm (previously 2.6 x 1.8 cm). Several other smaller lesions are also noted. No new lesions are confidently identified. No intra or extrahepatic biliary ductal dilatation. Status post cholecystectomy. Pancreas: In the inferior aspect of the pancreatic head (axial image 30 of series 22) there is again a 2.3 x 1.6 cm T1 hypointense, T2 hyperintense lesion which has multiple thin internal septations which demonstrates some minimal septal enhancement but no definite internal soft tissue enhancement, slightly decreased in size compared to the prior examination, favored to represent a involuting pancreatic pseudocyst. No other pancreatic mass. No pancreatic ductal dilatation. No peripancreatic fluid collections or inflammatory changes. Spleen:  Unremarkable. Adrenals/Urinary Tract: Multiple renal sinus cysts associated with both kidneys. Bilateral kidneys and adrenal glands are otherwise normal in appearance. No hydroureteronephrosis in the visualized portions of the abdomen. Stomach/Bowel: Visualized portions are unremarkable. Vascular/Lymphatic: No aneurysm identified  in the visualized abdominal vasculature. Mildly enlarged portacaval lymph node measuring 1.8 x 2.2 cm (axial image 45 of series 17), similar to the prior study. No  new lymphadenopathy noted in the abdomen. Other: No significant volume of ascites noted in the visualized portions of the peritoneal cavity. Musculoskeletal: No aggressive appearing osseous lesions are noted in the visualized portions of the skeleton. IMPRESSION: 1. Multiple intra hepatic lesions which appear unchanged in number, but slightly increased in size compared to the prior examination, concerning for slight progression of disease in this patient with known history of cholangiocarcinoma. 2. Enlarged portacaval lymph node measuring 1.8 x 2.2 cm, similar to the prior study, concerning for local nodal metastasis. 3. Slight decreased size of multilocular cystic lesion in the inferior aspect of the pancreatic head, likely to represent an involuting pseudocyst. Electronically Signed   By: Vinnie Langton M.D.   On: 08/09/2020 08:42    Medications: I have reviewed the patient's current medications.   Assessment/Plan: 1. Right liver mass-biopsy 07/10/2016 confirmed adenocarcinoma, PDL 1 0%, MSS, tumor mutation burden 3, IDH1 and PIK3 CA alterations  CT abdomen/pelvis 06/17/2016 and MRI abdomen 06/21/2016 confirmed an isolated right liver mass and a cystic pancreas lesion  PET scan 07/28/2016-the hepatic lesion is not hypermetabolic, hypermetabolic lesion in the gastric antrum suspicious for a primary neoplasm and no other evidence of metastatic disease  Upper endoscopy 08/03/2016-no mass found, biopsy from the gastric antrum-benign  CT-guided ablation of the right liver lesion 11/03/2016  MRI abdomen 03/08/2017-ablation defect in the right liver without residual/recurrent disease  MRI liver 06/11/2017-multiple new enhancing lesions in the right hepatic lobe surrounding the ablation defect consistent with progressive cholangiocarcinoma  Y-90 radioembolization of the right liver 07/19/2017  MRI abdomen 10/09/2017-suspected progression of intrahepatic larger carcinoma within segment 6, reviewed  by interventional radiology most consistent with pseudo-progression  MRI abdomen 01/08/2018-slight decrease in the segment 6 mass, no change in enhancement at the ablation bed, new tiny arterial phase area of hyperenhancement in the lateral left liver-potentially a vascular phenomena  MRI abdomen 04/03/2018-stable segment 6 mass, no change in 10 mm flash filling segment 3 lesion-potentially a vascular shunt, a porta hepatis node measures 2.2 cm compared to 1.5 cm, no new lymphadenopathy  MRI abdomen 07/03/2018 stable segment 6 liver mass, stable segment 3 arterial phase enhancing lesion stable porta hepatis adenopathy, stable pancreas cystic lesion  MRI abdomen 11/20/2018-new 2.1 cm lateral left liver lesion, new 8 mm medial left liver lesion, no change in posterior right hepatic mass, no change in necrotic hepato-duodenal ligament lymph node no change in cystic pancreas lesion  Y 90 of the left liver 01/16/2019  Cycle 1 gemcitabine/cisplatin 03/12/2019  Cycle 2 gemcitabine/cisplatin 04/09/2019 (doses reduced due to neutropenia)  Cycle 3 gemcitabine/cisplatin 05/07/2019  Cycle 4 gemcitabine/cisplatin 05/28/2019-Neulasta not given  Cycle 5 gemcitabine/cisplatin 06/18/2019  MRI abdomen 07/08/2019-progressive liver metastases  Ivosidenib 07/19/2019  MRI abdomen 10/27/2019-multiple hepatic lesions-grossly unchanged, stable to mildly improved porta hepatis node  Ivosidenib placed on hold 11/28/2019 due to QTc interval 488  Ivosidenib resumed 12/05/2019  MRI abdomen 03/16/2020-stable to improved liver lesions, no new lesions, stable portahepatis node, no evidence of disease progression  Ivosidenib continued  MRI liver 08/09/2020-slight enlargement of multiple hepatic lesions, no new lesion, stable portacaval lymph node, slight decrease of cystic pancreas lesion  Ivosidenib discontinued 08/11/2020 2. Left leg and right foot pain  Negative left leg Doppler 07/14/2016   3. Right abdominal  pain-potentially related to the dominant right liver mass  4.  Anorexia/weight loss  5. Benign appearing 16 mm cystic uncinatepancreas lesion noted on MRI 06/20/2016   Slightly larger on an MRI 03/08/2017, felt to most likely represent a side branch intraductal papillary mucinous neoplasm  Stable on MRI 10/09/2017  Stable on MRI 01/08/2018  Stable on MRI 04/03/2018  Stable on MRI 07/03/2018  Stable on MRI 11/20/2018  Stable on MRI 01/2019  Stable on MRI 03/17/2019  Slightly decreased on MRI 08/09/2020  6. Anemia-iron deficiency  7. Port-A-Cath placement interventional radiology 03/04/2019  8.Neutropenia secondary to chemotherapy-gemcitabineand cisplatindose reducedbeginning with cycle 2; mild neutropenia and thrombocytopenia 04/22/2019. Gemcitabine and cisplatin held. Treatment changed to every 3 weeks beginning on 05/14/2019.      Disposition: Ms. Chillemi appears unchanged.  The restaging MRI reveals an increase in the size of multiple liver lesions.  I discussed the MRI findings and treatment options with Ms. Leis.  We discussed continuing Ivosidenib as there are no new lesions and the progression is minimal.  We also discussed observation and salvage systemic therapy.  She feels the Ivosidenib is causing her malaise.  She would like to discontinue Ivosidenib and be followed with observation.  Ms. Carmon will return for an office visit and Port-A-Cath flush in approximately 1 month.  I reviewed the MRI images.  [name], MD  08/11/2020  10:07 AM

## 2020-08-23 DIAGNOSIS — I517 Cardiomegaly: Secondary | ICD-10-CM | POA: Diagnosis not present

## 2020-08-23 DIAGNOSIS — E7800 Pure hypercholesterolemia, unspecified: Secondary | ICD-10-CM | POA: Diagnosis not present

## 2020-08-23 DIAGNOSIS — C221 Intrahepatic bile duct carcinoma: Secondary | ICD-10-CM | POA: Diagnosis not present

## 2020-08-23 DIAGNOSIS — D692 Other nonthrombocytopenic purpura: Secondary | ICD-10-CM | POA: Diagnosis not present

## 2020-08-23 DIAGNOSIS — R7302 Impaired glucose tolerance (oral): Secondary | ICD-10-CM | POA: Diagnosis not present

## 2020-08-23 DIAGNOSIS — I129 Hypertensive chronic kidney disease with stage 1 through stage 4 chronic kidney disease, or unspecified chronic kidney disease: Secondary | ICD-10-CM | POA: Diagnosis not present

## 2020-08-23 DIAGNOSIS — R809 Proteinuria, unspecified: Secondary | ICD-10-CM | POA: Diagnosis not present

## 2020-08-23 DIAGNOSIS — N182 Chronic kidney disease, stage 2 (mild): Secondary | ICD-10-CM | POA: Diagnosis not present

## 2020-08-29 ENCOUNTER — Other Ambulatory Visit: Payer: Self-pay | Admitting: Oncology

## 2020-08-29 DIAGNOSIS — C221 Intrahepatic bile duct carcinoma: Secondary | ICD-10-CM

## 2020-09-06 ENCOUNTER — Other Ambulatory Visit: Payer: Self-pay

## 2020-09-06 ENCOUNTER — Telehealth: Payer: Self-pay | Admitting: Nurse Practitioner

## 2020-09-06 ENCOUNTER — Inpatient Hospital Stay: Payer: PPO | Attending: Oncology | Admitting: Nurse Practitioner

## 2020-09-06 ENCOUNTER — Encounter: Payer: Self-pay | Admitting: Nurse Practitioner

## 2020-09-06 VITALS — BP 148/63 | HR 80 | Temp 97.9°F | Resp 18 | Ht 65.0 in | Wt 140.8 lb

## 2020-09-06 DIAGNOSIS — Z452 Encounter for adjustment and management of vascular access device: Secondary | ICD-10-CM | POA: Diagnosis not present

## 2020-09-06 DIAGNOSIS — D696 Thrombocytopenia, unspecified: Secondary | ICD-10-CM | POA: Diagnosis not present

## 2020-09-06 DIAGNOSIS — C221 Intrahepatic bile duct carcinoma: Secondary | ICD-10-CM

## 2020-09-06 DIAGNOSIS — Z9221 Personal history of antineoplastic chemotherapy: Secondary | ICD-10-CM | POA: Diagnosis not present

## 2020-09-06 DIAGNOSIS — Z8505 Personal history of malignant neoplasm of liver: Secondary | ICD-10-CM | POA: Insufficient documentation

## 2020-09-06 NOTE — Progress Notes (Signed)
[facility] OFFICE PROGRESS NOTE   Diagnosis: Cholangiocarcinoma  INTERVAL HISTORY:   [name] returns as scheduled.  She overall feels better since discontinuing Ivosidenib.  Legs feel less weak.  Appetite is better.  No nausea or vomiting.  No abdominal pain.  No diarrhea.  No rash.  She denies fever, cough, shortness of breath.  Objective:  Vital signs in last 24 hours:  Blood pressure (!) 148/63, pulse 80, temperature 97.9 F (36.6 C), temperature source Tympanic, resp. rate 18, height '5\' 5"'  (1.651 m), weight 140 lb 12.8 oz (63.9 kg), SpO2 98 %.    HEENT: No thrush or ulcers. Resp: Lungs clear bilaterally. Cardio: Regular rate and rhythm. GI: Abdomen soft and nontender.  No hepatomegaly.  No mass. Vascular: No leg edema. Neuro: Alert and oriented. Skin: No rash. Port-A-Cath without erythema.   Lab Results:  Lab Results  Component Value Date   WBC 3.3 (L) 08/11/2020   HGB 12.7 08/11/2020   HCT 37.3 08/11/2020   MCV 96.1 08/11/2020   PLT 170 08/11/2020   NEUTROABS 2.1 08/11/2020    Imaging:  No results found.  Medications: I have reviewed the patient's current medications.  Assessment/Plan: 1. Right liver mass-biopsy 07/10/2016 confirmed adenocarcinoma, PDL 1 0%, MSS, tumor mutation burden 3, IDH1 and PIK3 CA alterations  CT abdomen/pelvis 06/17/2016 and MRI abdomen 06/21/2016 confirmed an isolated right liver mass and a cystic pancreas lesion  PET scan 07/28/2016-the hepatic lesion is not hypermetabolic, hypermetabolic lesion in the gastric antrum suspicious for a primary neoplasm and no other evidence of metastatic disease  Upper endoscopy 08/03/2016-no mass found, biopsy from the gastric antrum-benign  CT-guided ablation of the right liver lesion 11/03/2016  MRI abdomen 03/08/2017-ablation defect in the right liver without residual/recurrent disease  MRI liver 06/11/2017-multiple new enhancing lesions in the right hepatic lobe  surrounding the ablation defect consistent with progressive cholangiocarcinoma  Y-90 radioembolization of the right liver 07/19/2017  MRI abdomen 10/09/2017-suspected progression of intrahepatic larger carcinoma within segment 6, reviewed by interventional radiology most consistent with pseudo-progression  MRI abdomen 01/08/2018-slight decrease in the segment 6 mass, no change in enhancement at the ablation bed, new tiny arterial phase area of hyperenhancement in the lateral left liver-potentially a vascular phenomena  MRI abdomen 04/03/2018-stable segment 6 mass, no change in 10 mm flash filling segment 3 lesion-potentially a vascular shunt, a porta hepatis node measures 2.2 cm compared to 1.5 cm, no new lymphadenopathy  MRI abdomen 07/03/2018 stable segment 6 liver mass, stable segment 3 arterial phase enhancing lesion stable porta hepatis adenopathy, stable pancreas cystic lesion  MRI abdomen 11/20/2018-new 2.1 cm lateral left liver lesion, new 8 mm medial left liver lesion, no change in posterior right hepatic mass, no change in necrotic hepato-duodenal ligament lymph node no change in cystic pancreas lesion  Y 90 of the left liver 01/16/2019  Cycle 1 gemcitabine/cisplatin 03/12/2019  Cycle 2 gemcitabine/cisplatin 04/09/2019 (doses reduced due to neutropenia)  Cycle 3 gemcitabine/cisplatin 05/07/2019  Cycle 4 gemcitabine/cisplatin 05/28/2019-Neulasta not given  Cycle 5 gemcitabine/cisplatin 06/18/2019  MRI abdomen 07/08/2019-progressive liver metastases  Ivosidenib 07/19/2019  MRI abdomen 10/27/2019-multiple hepatic lesions-grossly unchanged, stable to mildly improved porta hepatis node  Ivosidenib placed on hold 11/28/2019 due to QTc interval 488  Ivosidenib resumed 12/05/2019  MRI abdomen 03/16/2020-stable to improved liver lesions, no new lesions, stable portahepatis node, no evidence of disease progression  Ivosidenib continued  MRI liver 08/09/2020-slight enlargement of multiple  hepatic lesions, no new lesion, stable portacaval lymph node, slight  decrease of cystic pancreas lesion  Ivosidenib discontinued 08/11/2020 2. Left leg and right foot pain  Negative left leg Doppler 07/14/2016   3. Right abdominal pain-potentially related to the dominant right liver mass  4. Anorexia/weight loss  5. Benign appearing 16 mm cystic uncinatepancreas lesion noted on MRI 06/20/2016   Slightly larger on an MRI 03/08/2017, felt to most likely represent a side branch intraductal papillary mucinous neoplasm  Stable on MRI 10/09/2017  Stable on MRI 01/08/2018  Stable on MRI 04/03/2018  Stable on MRI 07/03/2018  Stable on MRI 11/20/2018  Stable on MRI 01/2019  Stable on MRI 03/17/2019  Slightly decreased on MRI 08/09/2020  6. Anemia-iron deficiency  7. Port-A-Cath placement interventional radiology 03/04/2019  8.Neutropenia secondary to chemotherapy-gemcitabineand cisplatindose reducedbeginning with cycle 2; mild neutropenia and thrombocytopenia 04/22/2019. Gemcitabine and cisplatin held. Treatment changed to every 3 weeks beginning on 05/14/2019.    Disposition: Ms. Creek appears stable.  There is no clinical evidence of disease progression.  She would like to continue observation.  Port-A-Cath will be flushed today.  She will return for a Port-A-Cath flush and follow-up visit in 4 to 6 weeks.  She will contact the office in the interim with any problems.  Plan reviewed with Dr. Benay Spice.  Ned Card ANP/GNP-BC   09/06/2020  9:30 AM

## 2020-09-06 NOTE — Telephone Encounter (Signed)
Scheduled per los. Gave avs and calendar  

## 2020-09-15 DIAGNOSIS — X32XXXD Exposure to sunlight, subsequent encounter: Secondary | ICD-10-CM | POA: Diagnosis not present

## 2020-09-15 DIAGNOSIS — L570 Actinic keratosis: Secondary | ICD-10-CM | POA: Diagnosis not present

## 2020-09-15 DIAGNOSIS — D0439 Carcinoma in situ of skin of other parts of face: Secondary | ICD-10-CM | POA: Diagnosis not present

## 2020-10-11 ENCOUNTER — Other Ambulatory Visit: Payer: Self-pay | Admitting: Oncology

## 2020-10-11 DIAGNOSIS — C221 Intrahepatic bile duct carcinoma: Secondary | ICD-10-CM

## 2020-10-18 ENCOUNTER — Other Ambulatory Visit: Payer: Self-pay

## 2020-10-18 ENCOUNTER — Inpatient Hospital Stay: Payer: PPO | Attending: Oncology | Admitting: Oncology

## 2020-10-18 ENCOUNTER — Inpatient Hospital Stay: Payer: PPO

## 2020-10-18 VITALS — BP 126/58 | HR 81 | Temp 98.3°F | Resp 16 | Ht 65.0 in | Wt 141.5 lb

## 2020-10-18 DIAGNOSIS — Z452 Encounter for adjustment and management of vascular access device: Secondary | ICD-10-CM | POA: Insufficient documentation

## 2020-10-18 DIAGNOSIS — C221 Intrahepatic bile duct carcinoma: Secondary | ICD-10-CM | POA: Diagnosis not present

## 2020-10-18 DIAGNOSIS — Z95828 Presence of other vascular implants and grafts: Secondary | ICD-10-CM

## 2020-10-18 MED ORDER — HEPARIN SOD (PORK) LOCK FLUSH 100 UNIT/ML IV SOLN
500.0000 [IU] | Freq: Once | INTRAVENOUS | Status: AC
  Administered 2020-10-18: 500 [IU]
  Filled 2020-10-18: qty 5

## 2020-10-18 MED ORDER — SODIUM CHLORIDE 0.9% FLUSH
10.0000 mL | Freq: Once | INTRAVENOUS | Status: AC
  Administered 2020-10-18: 10 mL
  Filled 2020-10-18: qty 10

## 2020-10-18 NOTE — Progress Notes (Signed)
[place] OFFICE PROGRESS NOTE   Diagnosis: Cholangiocarcinoma  INTERVAL HISTORY:   Ms. Cimino returns as scheduled.  She continues to have nausea in the mornings, relieved with Compazine.  Her appetite is poor.  She reports her overall status is improved since discontinuing the Ivosidenib.  She has malaise.  Objective:  Vital signs in last 24 hours:  Blood pressure (!) 126/58, pulse 81, temperature 98.3 F (36.8 C), temperature source Tympanic, resp. rate 16, height '5\' 5"'  (1.651 m), weight 141 lb 8 oz (64.2 kg), SpO2 98 %.   Resp: Scattered end inspiratory rhonchi, no respiratory distress Cardio: Regular rate and rhythm GI: No mass, mild diffuse tenderness, soft oval lesion in the abdominal wall at the left abdomen a few centimeters inferior to the level of the umbilicus Vascular: No leg edema, the left lower leg is slightly larger than the right side    Portacath/PICC-without erythema  Lab Results:  Lab Results  Component Value Date   WBC 3.3 (L) 08/11/2020   HGB 12.7 08/11/2020   HCT 37.3 08/11/2020   MCV 96.1 08/11/2020   PLT 170 08/11/2020   NEUTROABS 2.1 08/11/2020    CMP  Lab Results  Component Value Date   NA 140 08/11/2020   K 3.8 08/11/2020   CL 104 08/11/2020   CO2 28 08/11/2020   GLUCOSE 103 (H) 08/11/2020   BUN 9 08/11/2020   CREATININE 0.76 08/11/2020   CALCIUM 10.3 08/11/2020   PROT 6.8 08/11/2020   ALBUMIN 3.8 08/11/2020   AST 17 08/11/2020   ALT 7 08/11/2020   ALKPHOS 124 08/11/2020   BILITOT 0.5 08/11/2020   GFRNONAA >60 08/11/2020   GFRAA >60 08/11/2020    Lab Results  Component Value Date   CEA1 <1.00 02/21/2019    Medications: I have reviewed the patient's current medications.   Assessment/Plan: 1. Right liver mass-biopsy 07/10/2016 confirmed adenocarcinoma, PDL 1 0%, MSS, tumor mutation burden 3, IDH1 and PIK3 CA alterations  CT abdomen/pelvis 06/17/2016 and MRI abdomen 06/21/2016 confirmed an isolated  right liver mass and a cystic pancreas lesion  PET scan 07/28/2016-the hepatic lesion is not hypermetabolic, hypermetabolic lesion in the gastric antrum suspicious for a primary neoplasm and no other evidence of metastatic disease  Upper endoscopy 08/03/2016-no mass found, biopsy from the gastric antrum-benign  CT-guided ablation of the right liver lesion 11/03/2016  MRI abdomen 03/08/2017-ablation defect in the right liver without residual/recurrent disease  MRI liver 06/11/2017-multiple new enhancing lesions in the right hepatic lobe surrounding the ablation defect consistent with progressive cholangiocarcinoma  Y-90 radioembolization of the right liver 07/19/2017  MRI abdomen 10/09/2017-suspected progression of intrahepatic larger carcinoma within segment 6, reviewed by interventional radiology most consistent with pseudo-progression  MRI abdomen 01/08/2018-slight decrease in the segment 6 mass, no change in enhancement at the ablation bed, new tiny arterial phase area of hyperenhancement in the lateral left liver-potentially a vascular phenomena  MRI abdomen 04/03/2018-stable segment 6 mass, no change in 10 mm flash filling segment 3 lesion-potentially a vascular shunt, a porta hepatis node measures 2.2 cm compared to 1.5 cm, no new lymphadenopathy  MRI abdomen 07/03/2018 stable segment 6 liver mass, stable segment 3 arterial phase enhancing lesion stable porta hepatis adenopathy, stable pancreas cystic lesion  MRI abdomen 11/20/2018-new 2.1 cm lateral left liver lesion, new 8 mm medial left liver lesion, no change in posterior right hepatic mass, no change in necrotic hepato-duodenal ligament lymph node no change in cystic pancreas lesion  Y 90 of the  left liver 01/16/2019  Cycle 1 gemcitabine/cisplatin 03/12/2019  Cycle 2 gemcitabine/cisplatin 04/09/2019 (doses reduced due to neutropenia)  Cycle 3 gemcitabine/cisplatin 05/07/2019  Cycle 4 gemcitabine/cisplatin 05/28/2019-Neulasta not  given  Cycle 5 gemcitabine/cisplatin 06/18/2019  MRI abdomen 07/08/2019-progressive liver metastases  Ivosidenib 07/19/2019  MRI abdomen 10/27/2019-multiple hepatic lesions-grossly unchanged, stable to mildly improved porta hepatis node  Ivosidenib placed on hold 11/28/2019 due to QTc interval 488  Ivosidenib resumed 12/05/2019  MRI abdomen 03/16/2020-stable to improved liver lesions, no new lesions, stable portahepatis node, no evidence of disease progression  Ivosidenib continued  MRI liver 08/09/2020-slight enlargement of multiple hepatic lesions, no new lesion, stable portacaval lymph node, slight decrease of cystic pancreas lesion  Ivosidenib discontinued 08/11/2020 2. Left leg and right foot pain  Negative left leg Doppler 07/14/2016   3. Right abdominal pain-potentially related to the dominant right liver mass  4. Anorexia/weight loss  5. Benign appearing 16 mm cystic uncinatepancreas lesion noted on MRI 06/20/2016   Slightly larger on an MRI 03/08/2017, felt to most likely represent a side branch intraductal papillary mucinous neoplasm  Stable on MRI 10/09/2017  Stable on MRI 01/08/2018  Stable on MRI 04/03/2018  Stable on MRI 07/03/2018  Stable on MRI 11/20/2018  Stable on MRI 01/2019  Stable on MRI 03/17/2019  Slightly decreased on MRI 08/09/2020  6. Anemia-iron deficiency  7. Port-A-Cath placement interventional radiology 03/04/2019  8.Neutropenia secondary to chemotherapy-gemcitabineand cisplatindose reducedbeginning with cycle 2; mild neutropenia and thrombocytopenia 04/22/2019. Gemcitabine and cisplatin held. Treatment changed to every 3 weeks beginning on 05/14/2019.    Disposition: Ms. Pinkstaff appears unchanged.  She has stable nausea and malaise.  The left abdominal wall finding is likely related to a lipoma or cyst.  The plan is to continue observation.  We discussed palliative care and hospice.  She does not wish to enroll  at present.  Ms. Doffing will return for an office visit and Port-A-Cath flush in 6 weeks.  She will contact us in the interim for new symptoms.  [name], MD  10/18/2020  8:55 AM

## 2020-10-29 ENCOUNTER — Telehealth: Payer: Self-pay | Admitting: Nurse Practitioner

## 2020-10-29 NOTE — Telephone Encounter (Signed)
Rescheduled appointments per provider PAL schedule. Patient is aware of updated appointment date and time.

## 2020-11-09 DIAGNOSIS — Z85828 Personal history of other malignant neoplasm of skin: Secondary | ICD-10-CM | POA: Diagnosis not present

## 2020-11-09 DIAGNOSIS — X32XXXD Exposure to sunlight, subsequent encounter: Secondary | ICD-10-CM | POA: Diagnosis not present

## 2020-11-09 DIAGNOSIS — Z08 Encounter for follow-up examination after completed treatment for malignant neoplasm: Secondary | ICD-10-CM | POA: Diagnosis not present

## 2020-11-09 DIAGNOSIS — L570 Actinic keratosis: Secondary | ICD-10-CM | POA: Diagnosis not present

## 2020-11-12 ENCOUNTER — Other Ambulatory Visit: Payer: Self-pay | Admitting: Oncology

## 2020-11-12 DIAGNOSIS — C221 Intrahepatic bile duct carcinoma: Secondary | ICD-10-CM

## 2020-11-15 DIAGNOSIS — N390 Urinary tract infection, site not specified: Secondary | ICD-10-CM | POA: Diagnosis not present

## 2020-11-15 DIAGNOSIS — Z1231 Encounter for screening mammogram for malignant neoplasm of breast: Secondary | ICD-10-CM | POA: Diagnosis not present

## 2020-11-15 DIAGNOSIS — R300 Dysuria: Secondary | ICD-10-CM | POA: Diagnosis not present

## 2020-11-24 ENCOUNTER — Other Ambulatory Visit: Payer: Self-pay | Admitting: Oncology

## 2020-11-24 DIAGNOSIS — C221 Intrahepatic bile duct carcinoma: Secondary | ICD-10-CM

## 2020-11-29 ENCOUNTER — Inpatient Hospital Stay: Payer: PPO | Admitting: Nurse Practitioner

## 2020-11-29 ENCOUNTER — Other Ambulatory Visit: Payer: Self-pay | Admitting: *Deleted

## 2020-11-29 ENCOUNTER — Encounter: Payer: Self-pay | Admitting: Nurse Practitioner

## 2020-11-29 ENCOUNTER — Inpatient Hospital Stay: Payer: PPO

## 2020-11-29 ENCOUNTER — Inpatient Hospital Stay: Payer: PPO | Attending: Oncology

## 2020-11-29 ENCOUNTER — Other Ambulatory Visit: Payer: Self-pay

## 2020-11-29 VITALS — BP 130/80 | HR 73 | Temp 97.0°F | Resp 17 | Ht 65.0 in | Wt 140.8 lb

## 2020-11-29 DIAGNOSIS — R634 Abnormal weight loss: Secondary | ICD-10-CM | POA: Insufficient documentation

## 2020-11-29 DIAGNOSIS — D509 Iron deficiency anemia, unspecified: Secondary | ICD-10-CM | POA: Diagnosis not present

## 2020-11-29 DIAGNOSIS — C221 Intrahepatic bile duct carcinoma: Secondary | ICD-10-CM

## 2020-11-29 DIAGNOSIS — R630 Anorexia: Secondary | ICD-10-CM | POA: Insufficient documentation

## 2020-11-29 DIAGNOSIS — R300 Dysuria: Secondary | ICD-10-CM

## 2020-11-29 DIAGNOSIS — R197 Diarrhea, unspecified: Secondary | ICD-10-CM | POA: Insufficient documentation

## 2020-11-29 DIAGNOSIS — R309 Painful micturition, unspecified: Secondary | ICD-10-CM | POA: Insufficient documentation

## 2020-11-29 DIAGNOSIS — Z87440 Personal history of urinary (tract) infections: Secondary | ICD-10-CM | POA: Insufficient documentation

## 2020-11-29 DIAGNOSIS — C9000 Multiple myeloma not having achieved remission: Secondary | ICD-10-CM

## 2020-11-29 DIAGNOSIS — Z95828 Presence of other vascular implants and grafts: Secondary | ICD-10-CM

## 2020-11-29 LAB — URINALYSIS, COMPLETE (UACMP) WITH MICROSCOPIC
Bilirubin Urine: NEGATIVE
Glucose, UA: NEGATIVE mg/dL
Hgb urine dipstick: NEGATIVE
Ketones, ur: NEGATIVE mg/dL
Nitrite: NEGATIVE
Protein, ur: NEGATIVE mg/dL
Specific Gravity, Urine: 1.005 (ref 1.005–1.030)
WBC, UA: >50 WBC/hpf — ABNORMAL HIGH (ref 0–5)
pH: 6 (ref 5.0–8.0)

## 2020-11-29 MED ORDER — SODIUM CHLORIDE 0.9% FLUSH
10.0000 mL | Freq: Once | INTRAVENOUS | Status: AC
  Administered 2020-11-29: 10 mL
  Filled 2020-11-29: qty 10

## 2020-11-29 MED ORDER — HEPARIN SOD (PORK) LOCK FLUSH 100 UNIT/ML IV SOLN
500.0000 [IU] | Freq: Once | INTRAVENOUS | Status: AC
  Administered 2020-11-29: 500 [IU]
  Filled 2020-11-29: qty 5

## 2020-11-29 NOTE — Patient Instructions (Signed)

## 2020-11-29 NOTE — Progress Notes (Signed)
[place] OFFICE PROGRESS NOTE   Diagnosis: Cholangiocarcinoma  INTERVAL HISTORY:   [name] returns as scheduled.  She reports from a cancer perspective she is doing well.  She had a urinary tract infection about 3 weeks ago.  She completed an antibiotic (cannot remember the name) with partial improvement.  She continues to have symptoms of a urinary tract infection.  She is also having diarrhea and abdominal cramps.  First bowel movement of the day is "normal".  Next 2 bowel movements are watery.  No nausea or vomiting.  No fever.  Objective:  Vital signs in last 24 hours:  Blood pressure 130/80, pulse 73, temperature (!) 97 F (36.1 C), temperature source Tympanic, resp. rate 17, height '5\' 5"'  (1.651 m), weight 140 lb 12.8 oz (63.9 kg), SpO2 99 %.    Resp: Lungs clear bilaterally. Cardio: Regular rate and rhythm. GI: Abdomen soft and nontender.  No hepatomegaly. Vascular: No leg edema. Port-A-Cath without erythema.  Lab Results:  Lab Results  Component Value Date   WBC 3.3 (L) 08/11/2020   HGB 12.7 08/11/2020   HCT 37.3 08/11/2020   MCV 96.1 08/11/2020   PLT 170 08/11/2020   NEUTROABS 2.1 08/11/2020    Imaging:  No results found.  Medications: I have reviewed the patient's current medications.  Assessment/Plan: 1. Right liver mass-biopsy 07/10/2016 confirmed adenocarcinoma, PDL 1 0%, MSS, tumor mutation burden 3, IDH1 and PIK3 CA alterations  CT abdomen/pelvis 06/17/2016 and MRI abdomen 06/21/2016 confirmed an isolated right liver mass and a cystic pancreas lesion  PET scan 07/28/2016-the hepatic lesion is not hypermetabolic, hypermetabolic lesion in the gastric antrum suspicious for a primary neoplasm and no other evidence of metastatic disease  Upper endoscopy 08/03/2016-no mass found, biopsy from the gastric antrum-benign  CT-guided ablation of the right liver lesion 11/03/2016  MRI abdomen 03/08/2017-ablation defect in the right liver  without residual/recurrent disease  MRI liver 06/11/2017-multiple new enhancing lesions in the right hepatic lobe surrounding the ablation defect consistent with progressive cholangiocarcinoma  Y-90 radioembolization of the right liver 07/19/2017  MRI abdomen 10/09/2017-suspected progression of intrahepatic larger carcinoma within segment 6, reviewed by interventional radiology most consistent with pseudo-progression  MRI abdomen 01/08/2018-slight decrease in the segment 6 mass, no change in enhancement at the ablation bed, new tiny arterial phase area of hyperenhancement in the lateral left liver-potentially a vascular phenomena  MRI abdomen 04/03/2018-stable segment 6 mass, no change in 10 mm flash filling segment 3 lesion-potentially a vascular shunt, a porta hepatis node measures 2.2 cm compared to 1.5 cm, no new lymphadenopathy  MRI abdomen 07/03/2018 stable segment 6 liver mass, stable segment 3 arterial phase enhancing lesion stable porta hepatis adenopathy, stable pancreas cystic lesion  MRI abdomen 11/20/2018-new 2.1 cm lateral left liver lesion, new 8 mm medial left liver lesion, no change in posterior right hepatic mass, no change in necrotic hepato-duodenal ligament lymph node no change in cystic pancreas lesion  Y 90 of the left liver 01/16/2019  Cycle 1 gemcitabine/cisplatin 03/12/2019  Cycle 2 gemcitabine/cisplatin 04/09/2019 (doses reduced due to neutropenia)  Cycle 3 gemcitabine/cisplatin 05/07/2019  Cycle 4 gemcitabine/cisplatin 05/28/2019-Neulasta not given  Cycle 5 gemcitabine/cisplatin 06/18/2019  MRI abdomen 07/08/2019-progressive liver metastases  Ivosidenib 07/19/2019  MRI abdomen 10/27/2019-multiple hepatic lesions-grossly unchanged, stable to mildly improved porta hepatis node  Ivosidenib placed on hold 11/28/2019 due to QTc interval 488  Ivosidenib resumed 12/05/2019  MRI abdomen 03/16/2020-stable to improved liver lesions, no new lesions, stable portahepatis  node, no evidence of  disease progression  Ivosidenib continued  MRI liver 08/09/2020-slight enlargement of multiple hepatic lesions, no new lesion, stable portacaval lymph node, slight decrease of cystic pancreas lesion  Ivosidenib discontinued 08/11/2020 2. Left leg and right foot pain  Negative left leg Doppler 07/14/2016   3. Right abdominal pain-potentially related to the dominant right liver mass  4. Anorexia/weight loss  5. Benign appearing 16 mm cystic uncinatepancreas lesion noted on MRI 06/20/2016   Slightly larger on an MRI 03/08/2017, felt to most likely represent a side branch intraductal papillary mucinous neoplasm  Stable on MRI 10/09/2017  Stable on MRI 01/08/2018  Stable on MRI 04/03/2018  Stable on MRI 07/03/2018  Stable on MRI 11/20/2018  Stable on MRI 01/2019  Stable on MRI 03/17/2019  Slightly decreased on MRI 08/09/2020  6. Anemia-iron deficiency  7. Port-A-Cath placement interventional radiology 03/04/2019  8.Neutropenia secondary to chemotherapy-gemcitabineand cisplatindose reducedbeginning with cycle 2; mild neutropenia and thrombocytopenia 04/22/2019. Gemcitabine and cisplatin held. Treatment changed to every 3 weeks beginning on 05/14/2019.  Disposition: Laura [name] appears stable.  She confirmed her desire to continue observation with regard to the cancer.  She will submit a urine specimen today.  We also provided a collection kit to her for C. difficile testing.  She will return in 3 weeks for follow-up.  We are available to see her sooner if needed.  Plan reviewed with Dr. Benay Spice.    Ned Card ANP/GNP-BC   11/29/2020  2:17 PM

## 2020-11-30 ENCOUNTER — Other Ambulatory Visit: Payer: Self-pay | Admitting: Nurse Practitioner

## 2020-11-30 ENCOUNTER — Other Ambulatory Visit: Payer: Self-pay | Admitting: *Deleted

## 2020-11-30 ENCOUNTER — Ambulatory Visit: Payer: PPO | Admitting: Nurse Practitioner

## 2020-11-30 DIAGNOSIS — R300 Dysuria: Secondary | ICD-10-CM

## 2020-11-30 DIAGNOSIS — C221 Intrahepatic bile duct carcinoma: Secondary | ICD-10-CM

## 2020-11-30 MED ORDER — NITROFURANTOIN MONOHYD MACRO 100 MG PO CAPS: 100.0000 mg | ORAL_CAPSULE | Freq: Two times a day (BID) | ORAL | 0 refills | Status: AC

## 2020-12-01 DIAGNOSIS — C221 Intrahepatic bile duct carcinoma: Secondary | ICD-10-CM | POA: Diagnosis not present

## 2020-12-01 LAB — C DIFFICILE QUICK SCREEN W PCR REFLEX
C Diff antigen: NEGATIVE
C Diff interpretation: NOT DETECTED
C Diff toxin: NEGATIVE

## 2020-12-01 LAB — URINE CULTURE: Culture: NO GROWTH

## 2020-12-08 ENCOUNTER — Telehealth: Payer: Self-pay

## 2020-12-08 NOTE — Telephone Encounter (Signed)
Pt LM requesting a refill of CLONAZEPAM.

## 2020-12-10 ENCOUNTER — Other Ambulatory Visit: Payer: Self-pay | Admitting: *Deleted

## 2020-12-10 DIAGNOSIS — C221 Intrahepatic bile duct carcinoma: Secondary | ICD-10-CM

## 2020-12-10 MED ORDER — CLONAZEPAM 0.5 MG PO TABS: 0.7500 mg | ORAL_TABLET | Freq: Two times a day (BID) | ORAL | 0 refills | Status: DC | PRN

## 2020-12-10 NOTE — Telephone Encounter (Signed)
Faxed refill request for clonazepam

## 2020-12-20 ENCOUNTER — Telehealth: Payer: Self-pay | Admitting: Nurse Practitioner

## 2020-12-20 ENCOUNTER — Other Ambulatory Visit: Payer: Self-pay

## 2020-12-20 ENCOUNTER — Encounter: Payer: Self-pay | Admitting: Nurse Practitioner

## 2020-12-20 ENCOUNTER — Inpatient Hospital Stay: Payer: PPO | Admitting: Nurse Practitioner

## 2020-12-20 VITALS — BP 140/56 | HR 89 | Temp 97.4°F | Resp 15 | Ht 65.0 in | Wt 139.4 lb

## 2020-12-20 DIAGNOSIS — C221 Intrahepatic bile duct carcinoma: Secondary | ICD-10-CM

## 2020-12-20 NOTE — Telephone Encounter (Signed)
Scheduled appointments per 12/27 los. Spoke to patient who is aware of appointments date and times. Gave patient calendar print out.

## 2020-12-20 NOTE — Progress Notes (Signed)
[facility] OFFICE PROGRESS NOTE   Diagnosis: Cholangiocarcinoma  INTERVAL HISTORY:   [name] returns as scheduled.  At the time of her last visit 11/29/2020 she was experiencing urinary symptoms, diarrhea and abdominal cramps.  Stool testing was negative for C. difficile.  Urinalysis was consistent with an infection.  She completed a 5-day course of nitrofurantoin.  The urine culture returned negative.  The urine symptoms have improved.  She had an episode of left flank pain and nausea last week.  Symptoms resolved spontaneously and have not recurred.  No fever.  She continues to have intermittent crampy abdominal pain and diarrhea.  She wonders if symptoms are related to IBS.  Objective:  Vital signs in last 24 hours:  Blood pressure (!) 140/56, pulse 89, temperature (!) 97.4 F (36.3 C), temperature source Tympanic, resp. rate 15, height '5\' 5"'  (1.651 m), weight 139 lb 6.4 oz (63.2 kg), SpO2 100 %.    Resp: Lungs clear bilaterally. Cardio: Regular rate and rhythm. GI: Abdomen soft and nontender.  No hepatomegaly.  Bowel sounds normoactive. Vascular: No leg edema. Neuro: Alert and oriented. Port-A-Cath without erythema  Lab Results:  Lab Results  Component Value Date   WBC 3.3 (L) 08/11/2020   HGB 12.7 08/11/2020   HCT 37.3 08/11/2020   MCV 96.1 08/11/2020   PLT 170 08/11/2020   NEUTROABS 2.1 08/11/2020    Imaging:  No results found.  Medications: I have reviewed the patient's current medications.  Assessment/Plan: 1. Right liver mass-biopsy 07/10/2016 confirmed adenocarcinoma, PDL 1 0%, MSS, tumor mutation burden 3, IDH1 and PIK3 CA alterations  CT abdomen/pelvis 06/17/2016 and MRI abdomen 06/21/2016 confirmed an isolated right liver mass and a cystic pancreas lesion  PET scan 07/28/2016-the hepatic lesion is not hypermetabolic, hypermetabolic lesion in the gastric antrum suspicious for a primary neoplasm and no other evidence of metastatic  disease  Upper endoscopy 08/03/2016-no mass found, biopsy from the gastric antrum-benign  CT-guided ablation of the right liver lesion 11/03/2016  MRI abdomen 03/08/2017-ablation defect in the right liver without residual/recurrent disease  MRI liver 06/11/2017-multiple new enhancing lesions in the right hepatic lobe surrounding the ablation defect consistent with progressive cholangiocarcinoma  Y-90 radioembolization of the right liver 07/19/2017  MRI abdomen 10/09/2017-suspected progression of intrahepatic larger carcinoma within segment 6, reviewed by interventional radiology most consistent with pseudo-progression  MRI abdomen 01/08/2018-slight decrease in the segment 6 mass, no change in enhancement at the ablation bed, new tiny arterial phase area of hyperenhancement in the lateral left liver-potentially a vascular phenomena  MRI abdomen 04/03/2018-stable segment 6 mass, no change in 10 mm flash filling segment 3 lesion-potentially a vascular shunt, a porta hepatis node measures 2.2 cm compared to 1.5 cm, no new lymphadenopathy  MRI abdomen 07/03/2018 stable segment 6 liver mass, stable segment 3 arterial phase enhancing lesion stable porta hepatis adenopathy, stable pancreas cystic lesion  MRI abdomen 11/20/2018-new 2.1 cm lateral left liver lesion, new 8 mm medial left liver lesion, no change in posterior right hepatic mass, no change in necrotic hepato-duodenal ligament lymph node no change in cystic pancreas lesion  Y 90 of the left liver 01/16/2019  Cycle 1 gemcitabine/cisplatin 03/12/2019  Cycle 2 gemcitabine/cisplatin 04/09/2019 (doses reduced due to neutropenia)  Cycle 3 gemcitabine/cisplatin 05/07/2019  Cycle 4 gemcitabine/cisplatin 05/28/2019-Neulasta not given  Cycle 5 gemcitabine/cisplatin 06/18/2019  MRI abdomen 07/08/2019-progressive liver metastases  Ivosidenib 07/19/2019  MRI abdomen 10/27/2019-multiple hepatic lesions-grossly unchanged, stable to mildly improved  porta hepatis node  Ivosidenib placed on  hold 11/28/2019 due to QTc interval 488  Ivosidenib resumed 12/05/2019  MRI abdomen 03/16/2020-stable to improved liver lesions, no new lesions, stable portahepatis node, no evidence of disease progression  Ivosidenib continued  MRI liver 08/09/2020-slight enlargement of multiple hepatic lesions, no new lesion, stable portacaval lymph node, slight decrease of cystic pancreas lesion  Ivosidenib discontinued 08/11/2020 2. Left leg and right foot pain  Negative left leg Doppler 07/14/2016   3. Right abdominal pain-potentially related to the dominant right liver mass  4. Anorexia/weight loss  5. Benign appearing 16 mm cystic uncinatepancreas lesion noted on MRI 06/20/2016   Slightly larger on an MRI 03/08/2017, felt to most likely represent a side branch intraductal papillary mucinous neoplasm  Stable on MRI 10/09/2017  Stable on MRI 01/08/2018  Stable on MRI 04/03/2018  Stable on MRI 07/03/2018  Stable on MRI 11/20/2018  Stable on MRI 01/2019  Stable on MRI 03/17/2019  Slightly decreased on MRI 08/09/2020  6. Anemia-iron deficiency  7. Port-A-Cath placement interventional radiology 03/04/2019  8.Neutropenia secondary to chemotherapy-gemcitabineand cisplatindose reducedbeginning with cycle 2; mild neutropenia and thrombocytopenia 04/22/2019. Gemcitabine and cisplatin held. Treatment changed to every 3 weeks beginning on 05/14/2019.  Disposition: Laura [name] appears unchanged.  She is currently being followed with observation and confirms at today's visit she would like to continue with this approach.  We discussed a referral to hospice.  She will consider this and would like to discuss further at her next visit.  We discussed the bowel symptoms may somehow be related to cancer.  She would like to treat symptomatically. She will try Imodium for the loose stools.  She will return for a Port-A-Cath flush and  follow-up visit in 4 weeks.  Plan reviewed with Dr. Benay Spice.    Ned Card ANP/GNP-BC   12/20/2020  10:26 AM

## 2021-01-11 ENCOUNTER — Other Ambulatory Visit: Payer: Self-pay | Admitting: Oncology

## 2021-01-11 DIAGNOSIS — C221 Intrahepatic bile duct carcinoma: Secondary | ICD-10-CM

## 2021-01-17 ENCOUNTER — Other Ambulatory Visit: Payer: Self-pay

## 2021-01-17 ENCOUNTER — Telehealth: Payer: Self-pay

## 2021-01-17 ENCOUNTER — Inpatient Hospital Stay: Payer: PPO | Attending: Oncology

## 2021-01-17 ENCOUNTER — Inpatient Hospital Stay: Payer: PPO | Admitting: Oncology

## 2021-01-17 VITALS — BP 138/74 | HR 76 | Temp 97.0°F | Resp 16 | Ht 65.0 in | Wt 136.4 lb

## 2021-01-17 DIAGNOSIS — C221 Intrahepatic bile duct carcinoma: Secondary | ICD-10-CM | POA: Diagnosis not present

## 2021-01-17 DIAGNOSIS — Z79899 Other long term (current) drug therapy: Secondary | ICD-10-CM | POA: Diagnosis not present

## 2021-01-17 DIAGNOSIS — R110 Nausea: Secondary | ICD-10-CM | POA: Insufficient documentation

## 2021-01-17 DIAGNOSIS — R630 Anorexia: Secondary | ICD-10-CM | POA: Insufficient documentation

## 2021-01-17 DIAGNOSIS — Z9221 Personal history of antineoplastic chemotherapy: Secondary | ICD-10-CM | POA: Insufficient documentation

## 2021-01-17 DIAGNOSIS — R634 Abnormal weight loss: Secondary | ICD-10-CM | POA: Diagnosis not present

## 2021-01-17 NOTE — Progress Notes (Signed)
[name] OFF[facility] PROGRESS NOTE   Diagnosis: Cholangiocarcinoma  INTERVAL HISTORY:   Laura [name] returns as scheduled.  She remains off of specific therapy for the cholangiocarcinoma.  She continues to have nausea, no vomiting.  She takes Compazine every 6 hours.  She is having bowel movements.  No pain.  No other complaint.  Objective:  Vital signs in last 24 hours:  Blood pressure 138/74, pulse 76, temperature (!) 97 F (36.1 C), temperature source Tympanic, resp. rate 16, height '5\' 5"'  (1.651 m), weight 136 lb 6.4 oz (61.9 kg), SpO2 98 %.     Resp: Lungs clear bilaterally Cardio: Regular rate and rhythm GI: No hepatosplenomegaly, no mass, nontender Vascular: No leg edema  Portacath/PICC-without erythema  Lab Results:  Lab Results  Component Value Date   WBC 3.3 (L) 08/11/2020   HGB 12.7 08/11/2020   HCT 37.3 08/11/2020   MCV 96.1 08/11/2020   PLT 170 08/11/2020   NEUTROABS 2.1 08/11/2020    CMP  Lab Results  Component Value Date   NA 140 08/11/2020   K 3.8 08/11/2020   CL 104 08/11/2020   CO2 28 08/11/2020   GLUCOSE 103 (H) 08/11/2020   BUN 9 08/11/2020   CREATININE 0.76 08/11/2020   CALCIUM 10.3 08/11/2020   PROT 6.8 08/11/2020   ALBUMIN 3.8 08/11/2020   AST 17 08/11/2020   ALT 7 08/11/2020   ALKPHOS 124 08/11/2020   BILITOT 0.5 08/11/2020   GFRNONAA >60 08/11/2020   GFRAA >60 08/11/2020    Lab Results  Component Value Date   CEA1 <1.00 02/21/2019     Medications: I have reviewed the patient's current medications.   Assessment/Plan: 1. Right liver mass-biopsy 07/10/2016 confirmed adenocarcinoma, PDL 1 0%, MSS, tumor mutation burden 3, IDH1 and PIK3 CA alterations  CT abdomen/pelvis 06/17/2016 and MRI abdomen 06/21/2016 confirmed an isolated right liver mass and a cystic pancreas lesion  PET scan 07/28/2016-the hepatic lesion is not hypermetabolic, hypermetabolic lesion in the gastric antrum suspicious for a primary neoplasm  and no other evidence of metastatic disease  Upper endoscopy 08/03/2016-no mass found, biopsy from the gastric antrum-benign  CT-guided ablation of the right liver lesion 11/03/2016  MRI abdomen 03/08/2017-ablation defect in the right liver without residual/recurrent disease  MRI liver 06/11/2017-multiple new enhancing lesions in the right hepatic lobe surrounding the ablation defect consistent with progressive cholangiocarcinoma  Y-90 radioembolization of the right liver 07/19/2017  MRI abdomen 10/09/2017-suspected progression of intrahepatic larger carcinoma within segment 6, reviewed by interventional radiology most consistent with pseudo-progression  MRI abdomen 01/08/2018-slight decrease in the segment 6 mass, no change in enhancement at the ablation bed, new tiny arterial phase area of hyperenhancement in the lateral left liver-potentially a vascular phenomena  MRI abdomen 04/03/2018-stable segment 6 mass, no change in 10 mm flash filling segment 3 lesion-potentially a vascular shunt, a porta hepatis node measures 2.2 cm compared to 1.5 cm, no new lymphadenopathy  MRI abdomen 07/03/2018 stable segment 6 liver mass, stable segment 3 arterial phase enhancing lesion stable porta hepatis adenopathy, stable pancreas cystic lesion  MRI abdomen 11/20/2018-new 2.1 cm lateral left liver lesion, new 8 mm medial left liver lesion, no change in posterior right hepatic mass, no change in necrotic hepato-duodenal ligament lymph node no change in cystic pancreas lesion  Y 90 of the left liver 01/16/2019  Cycle 1 gemcitabine/cisplatin 03/12/2019  Cycle 2 gemcitabine/cisplatin 04/09/2019 (doses reduced due to neutropenia)  Cycle 3 gemcitabine/cisplatin 05/07/2019  Cycle 4 gemcitabine/cisplatin 05/28/2019-Neulasta not given  Cycle 5 gemcitabine/cisplatin 06/18/2019  MRI abdomen 07/08/2019-progressive liver metastases  Ivosidenib 07/19/2019  MRI abdomen 10/27/2019-multiple hepatic lesions-grossly  unchanged, stable to mildly improved porta hepatis node  Ivosidenib placed on hold 11/28/2019 due to QTc interval 488  Ivosidenib resumed 12/05/2019  MRI abdomen 03/16/2020-stable to improved liver lesions, no new lesions, stable portahepatis node, no evidence of disease progression  Ivosidenib continued  MRI liver 08/09/2020-slight enlargement of multiple hepatic lesions, no new lesion, stable portacaval lymph node, slight decrease of cystic pancreas lesion  Ivosidenib discontinued 08/11/2020 2. Left leg and right foot pain  Negative left leg Doppler 07/14/2016   3. Right abdominal pain-potentially related to the dominant right liver mass  4. Anorexia/weight loss  5. Benign appearing 16 mm cystic uncinatepancreas lesion noted on MRI 06/20/2016   Slightly larger on an MRI 03/08/2017, felt to most likely represent a side branch intraductal papillary mucinous neoplasm  Stable on MRI 10/09/2017  Stable on MRI 01/08/2018  Stable on MRI 04/03/2018  Stable on MRI 07/03/2018  Stable on MRI 11/20/2018  Stable on MRI 01/2019  Stable on MRI 03/17/2019  Slightly decreased on MRI 08/09/2020  6. Anemia-iron deficiency  7. Port-A-Cath placement interventional radiology 03/04/2019  8.Neutropenia secondary to chemotherapy-gemcitabineand cisplatindose reducedbeginning with cycle 2; mild neutropenia and thrombocytopenia 04/22/2019. Gemcitabine and cisplatin held. Treatment changed to every 3 weeks beginning on 05/14/2019.    Disposition: Laura [name] appears stable.  She will continue Compazine as needed for nausea.  We discussed hospice care.  She agrees to enroll in home hospice care.  We will make a referral today.  I discussed CODE STATUS with her.  She would like to discuss this with her daughter prior to making a decision.  Laura [name] will return for an office visit and Port-A-Cath flush in 6 weeks.  Laura Coder, MD  01/17/2021  8:39 AM

## 2021-01-17 NOTE — Telephone Encounter (Signed)
Spoke with patient regarding scheduling a consult appointment. She would like to discuss with her daughter and coordinate a date/time that works with her schedule. Patient states she will call back to schedule.

## 2021-01-17 NOTE — Progress Notes (Signed)
Referral made to [facility] per MD Benay Spice. Referral and requested info done via phone.

## 2021-01-18 ENCOUNTER — Telehealth: Payer: Self-pay

## 2021-01-18 ENCOUNTER — Telehealth: Payer: Self-pay | Admitting: Oncology

## 2021-01-18 NOTE — Telephone Encounter (Signed)
Spoke with patient and scheduled an in-person Palliative Consult for 02/08/21 @ 11AM  COVID screening was negative. No pets in home. Patient lives alone. Daughter will be coming from [place] to be at consult appointment.   Consent obtained; updated Outlook/Netsmart/Team List and Epic.  Family is aware they will be receiving a call from NP the day before or day of to confirm appointment.

## 2021-01-18 NOTE — Telephone Encounter (Signed)
Scheduled appointments per 1/24 los. Spoke to patient who is aware of appointments dates and times.  

## 2021-01-31 ENCOUNTER — Telehealth: Payer: Self-pay | Admitting: *Deleted

## 2021-01-31 NOTE — Telephone Encounter (Signed)
Nausea uncontrolled with prochlorperazine  5 mg every 6 hours. Suggested she take 10 mg every 6 hours and call back if this is not helpful. She is unsure if she has any ondansetron left, but will check. She will call back tomorrow if not better.

## 2021-02-08 ENCOUNTER — Other Ambulatory Visit: Payer: PPO | Admitting: Nurse Practitioner

## 2021-02-08 ENCOUNTER — Other Ambulatory Visit: Payer: Self-pay

## 2021-02-08 DIAGNOSIS — Z515 Encounter for palliative care: Secondary | ICD-10-CM

## 2021-02-08 DIAGNOSIS — R197 Diarrhea, unspecified: Secondary | ICD-10-CM

## 2021-02-08 NOTE — Progress Notes (Signed)
[facility] Consult Note Telephone: [phone]  Fax: [phone]  PATIENT NAME: Laura [name] [address] [place] [address] [phone] (home)  DOB: 04-12-1941 MRN: [id number]  PRIMARY CARE PROVIDER:    Haywood Pao, MD,  [place] [address] [phone]  REFERRING PROVIDER:   Haywood Pao, [place],  [place] [address] [phone]  RESPONSIBLE PARTY:   Extended Emergency Contact Information Primary Emergency Contact: Laura [name] [address] [place] Phone: [phone] Relation: Daughter Secondary Emergency Contact: Laura [name], [place] [address] [name] of [place] Phone: [phone] Relation: Daughter  I met face to face with patient in home.   ASSESSMENT AND RECOMMENDATIONS:   Advance Care Planning: Laura [name] is HCPOA. Today's visit consisted of building trust and discussions on Palliative care Medicine as a specialized medical care for people living with serious illness, aimed at facilitating improved quality of life through symptoms relief, assisting with advance care planning and establishing goals of care. Patient daughter Laura [name] present at visit, they expressed appreciation for education provided on Palliative care and how that differs from Hospice service. Palliative care will continue to provide support to patient, family and the medical team. Goal of care: Patient's goal of care is function. She desire to gain weight and have energy to do the things she likes to do, she loves to go shopping. Directives: After discussions on ramifications and implications of code status, patient decided to not be resuscitated in the event of cardiac or respiratory arrest. DNR form signed and given to patient to keep in home. The need to complete a MOST form was discussed, patient and daughter expressed interest in  completing a MOST form today. Discussed and reviewed sections of the form in detail, opportunity for questions given, all questions answered. Form completed and signed with patient and daughter, signed form given to patient to keep, copy of signed MOST and DNR form uploaded to [facility] EMR.   Symptom Management:  Diarrhea: report having up to 3 loose stools in a day, report symptoms ongoing, has prescription for Imodium as needed. Patient report not taking unless when she is going out. Report diarrhea makes her tired and weak at times. Denied Melena, denied rectal bleed, denied uncontrolled pain. Recommendation: Encouraged consistent taking imodium if more than 2 loose stools in a day. Encourage oral fluid intake. Nausea: report nausea that is worse in the mornings, report symptoms currently controled with compazine 50m every 6hrs. Report improved appetite, takes Ensure nutritional supplement 1 to 2 cans a day. Patient voiced no other concerns today. Provided general support and encouragement, no other unmet needs identified. Questions and concerns were addressed. Patient and daughter was encouraged to call with questions and/or concerns. My business card was provided.  Follow up Palliative Care Visit: Palliative care will continue to follow for complex decision making and symptom management. Return in about 4 weeks or prn.  Family /Caregiver/Community Supports: Patient lives at home independently.   Cognitive / Functional decline: Patient awake, alert and coherent.She is able to complete her ADLs independently, drives her car, walks without assistive device. No report of recent falls. Routinely goes to Chiropractor (Chiropractor for management of chronic neck pain.  I spent 50 minutes providing this consultation, time includes time spent with patient and family, chart review, provider coordination, and  documentation. More than 50% of the time in this consultation was spent counseling  and coordinating communication.   CHIEF COMPLAINT: diarrhea  History obtained from review of EMR and discussion with patient and her daughter Laura [name]. Records reviewed and summarized bellow.  HISTORY OF PRESENT ILLNESS:  Laura [name] is a 80 y.o. year old female with multiple medical problems including cholangiocarcinoma mets to liver (Ivosidenib was discontinued due to QTc interval elongation), HTN, IBS, GERD. Patient not currently on treatment for her cancer, her cancer was deemed not fast growing, patient report opting for just observation at this time with no intention of restarting treatment. Palliative Care was asked to follow this patient to help address advance care planning and goals of care. This is an initial visit.  CODE STATUS: DNR  PPS: 70%  HOSPICE ELIGIBILITY/DIAGNOSIS: yes/ cancer not on treatment, patient however has a high PPS, will continue to follow patient and facilitate referral as indicated  PHYSICAL EXAM / ROS:   Current and past weights: 140lbs down from baseline weight of 145lbs, Ht 24f5", BMI 23.3kg/m2 General: frail appearing, sitting in her living room in NAD actively participating in visit discussion. Cardiovascular: denied chest pain, denied palpitation, no edema  Pulmonary: no cough, no increased SOB, room air GI:  appetite fair, denied constipation, report diarrhea, continent of bowel GU: denies dysuria, continent of urine MSK:  no joint and ROM abnormalities, ambulatory Skin: no rashes or wounds reported Neurological: Weakness, but otherwise nonfocal Psych: non -anxious affect  EXAM: MRI ABDOMEN WITHOUT AND WITH CONTRAST completed on 08/09/2020 IMPRESSION: 1. Multiple intra hepatic lesions which appear unchanged in number, but slightly increased in size compared to the prior examination, concerning for slight progression of disease in this patient with known history of cholangiocarcinoma. 2. Enlarged portacaval lymph node measuring 1.8 x 2.2 cm,  similar to the prior study, concerning for local nodal metastasis. 3. Slight decreased size of multilocular cystic lesion in the inferior aspect of the pancreatic head, likely to represent an involuting pseudocyst.   Electronically Signed   By: DVinnie LangtonM.D.   On: 08/09/2020 08:42  PAST MEDICAL HISTORY:  Past Medical History:  Diagnosis Date  . [place] ([facility]   . GERD (gastroesophageal reflux disease)   . Heart murmur   . High cholesterol   . Hypertension   . IBS (irritable bowel syndrome)   . PONV (postoperative nausea and vomiting)   . Pre-diabetes     SOCIAL HX:  Social History   Tobacco Use  . Smoking status: Never Smoker  . Smokeless tobacco: Never Used  Substance Use Topics  . Alcohol use: No   FAMILY HX:  Family History  Problem Relation Age of Onset  . Alzheimer's disease Mother   . Alzheimer's disease Brother   . Alzheimer's disease Sister   . Breast cancer Sister   . CAD Brother 790      CABG  . Cancer Sister        Breast  . Cancer Brother        Lung    ALLERGIES:  Allergies  Allergen Reactions  . Pneumococcal Vaccines Swelling, Palpitations and Other (See Comments)    Fever  . Codeine Other (See Comments)    Looses balance and feels light headed     PERTINENT MEDICATIONS:  Outpatient Encounter Medications as of 02/08/2021  Medication Sig  . acetaminophen (TYLENOL) 500 MG tablet Take 1,000 mg by mouth every 6 (six) hours as needed for mild pain.  .Marland Kitchen  amLODipine (NORVASC) 5 MG tablet Take 5 mg by mouth daily.   Marland Kitchen aspirin EC 81 MG tablet Take 81 mg by mouth daily.  . Cholecalciferol (VITAMIN D3) 5000 units CAPS Take 5,000 Units by mouth daily.  . clonazePAM (KLONOPIN) 0.5 MG tablet Take 1.5 tablets (0.75 mg total) by mouth 2 (two) times daily as needed for anxiety. and 0.49ms at bedtime for sleep as needed  . cyanocobalamin 1000 MCG tablet Take 1,000 mcg by mouth daily.  .Marland Kitchendocusate sodium (COLACE) 50 MG capsule Take 50 mg by mouth daily  as needed for mild constipation.  (Patient not taking: Reported on 06/07/2020)  . doxazosin (CARDURA) 1 MG tablet Take 1 mg by mouth at bedtime.   . ferrous sulfate 325 (65 FE) MG tablet Take 650 mg by mouth 2 (two) times daily with a meal.   . Lancets (ONETOUCH DELICA PLUS [id number] MISC   . lidocaine-prilocaine (EMLA) cream Apply 1 application topically as needed.  .Marland Kitchenomeprazole (PRILOSEC) 20 MG capsule Take 20 mg by mouth 2 (two) times daily.  . ondansetron (ZOFRAN) 4 MG tablet Take 1 tablet (4 mg total) by mouth every 6 (six) hours as needed. for nausea (Patient not taking: Reported on 01/17/2021)  . ONETOUCH VERIO test strip USE TO TEST BLOOD SUGAR TWICE DAILY  . polyethylene glycol (MIRALAX / GLYCOLAX) packet Take 17 g by mouth daily as needed for mild constipation. (Patient not taking: No sig reported)  . potassium chloride SA (KLOR-CON) 20 MEQ tablet TAKE 1  BY MOUTH ONCE DAILY  . pravastatin (PRAVACHOL) 40 MG tablet Take 40 mg by mouth every evening.   . prochlorperazine (COMPAZINE) 5 MG tablet TAKE 1 TO 2 TABLETS BY MOUTH EVERY 6 HOURS AS NEEDED FOR NAUSEA OR VOMITING  . tetrahydrozoline 0.05 % ophthalmic solution Place 1 drop into both eyes daily as needed (dry eye).    No facility-administered encounter medications on file as of 02/08/2021.    Thank you for the opportunity to participate in the care of Ms. AAntonietta Barcelona The palliative care team will continue to follow. Please call our office at [phone] we can be of additional assistance.  QJari Favre DNP, AGPCNP-BC

## 2021-02-11 ENCOUNTER — Other Ambulatory Visit: Payer: Self-pay | Admitting: Oncology

## 2021-02-11 DIAGNOSIS — C221 Intrahepatic bile duct carcinoma: Secondary | ICD-10-CM

## 2021-02-24 DIAGNOSIS — E7800 Pure hypercholesterolemia, unspecified: Secondary | ICD-10-CM | POA: Diagnosis not present

## 2021-02-24 DIAGNOSIS — R7302 Impaired glucose tolerance (oral): Secondary | ICD-10-CM | POA: Diagnosis not present

## 2021-02-28 ENCOUNTER — Encounter: Payer: Self-pay | Admitting: Nurse Practitioner

## 2021-02-28 ENCOUNTER — Other Ambulatory Visit: Payer: Self-pay

## 2021-02-28 ENCOUNTER — Inpatient Hospital Stay: Payer: PPO | Admitting: Nurse Practitioner

## 2021-02-28 ENCOUNTER — Inpatient Hospital Stay: Payer: PPO | Attending: Nurse Practitioner

## 2021-02-28 ENCOUNTER — Inpatient Hospital Stay: Payer: PPO

## 2021-02-28 DIAGNOSIS — T451X5A Adverse effect of antineoplastic and immunosuppressive drugs, initial encounter: Secondary | ICD-10-CM | POA: Diagnosis not present

## 2021-02-28 DIAGNOSIS — R110 Nausea: Secondary | ICD-10-CM | POA: Diagnosis not present

## 2021-02-28 DIAGNOSIS — C221 Intrahepatic bile duct carcinoma: Secondary | ICD-10-CM | POA: Diagnosis not present

## 2021-02-28 DIAGNOSIS — R197 Diarrhea, unspecified: Secondary | ICD-10-CM | POA: Diagnosis not present

## 2021-02-28 DIAGNOSIS — D6959 Other secondary thrombocytopenia: Secondary | ICD-10-CM | POA: Diagnosis not present

## 2021-02-28 DIAGNOSIS — D701 Agranulocytosis secondary to cancer chemotherapy: Secondary | ICD-10-CM | POA: Insufficient documentation

## 2021-02-28 DIAGNOSIS — Z79899 Other long term (current) drug therapy: Secondary | ICD-10-CM | POA: Diagnosis not present

## 2021-02-28 DIAGNOSIS — Z95828 Presence of other vascular implants and grafts: Secondary | ICD-10-CM

## 2021-02-28 DIAGNOSIS — Z452 Encounter for adjustment and management of vascular access device: Secondary | ICD-10-CM | POA: Insufficient documentation

## 2021-02-28 DIAGNOSIS — R630 Anorexia: Secondary | ICD-10-CM | POA: Insufficient documentation

## 2021-02-28 DIAGNOSIS — R634 Abnormal weight loss: Secondary | ICD-10-CM | POA: Diagnosis not present

## 2021-02-28 DIAGNOSIS — R531 Weakness: Secondary | ICD-10-CM | POA: Insufficient documentation

## 2021-02-28 MED ORDER — HEPARIN SOD (PORK) LOCK FLUSH 100 UNIT/ML IV SOLN
500.0000 [IU] | Freq: Once | INTRAVENOUS | Status: AC
  Administered 2021-02-28: 500 [IU]
  Filled 2021-02-28: qty 5

## 2021-02-28 MED ORDER — POTASSIUM CHLORIDE CRYS ER 20 MEQ PO TBCR: EXTENDED_RELEASE_TABLET | ORAL | 2 refills | Status: DC

## 2021-02-28 MED ORDER — SODIUM CHLORIDE 0.9% FLUSH
10.0000 mL | Freq: Once | INTRAVENOUS | Status: AC
  Administered 2021-02-28: 10 mL
  Filled 2021-02-28: qty 10

## 2021-02-28 NOTE — Progress Notes (Signed)
[facility] OFFICE PROGRESS NOTE   Diagnosis: Cholangiocarcinoma  INTERVAL HISTORY:   Laura [name] returns as scheduled.  She has enrolled in the palliative care program.  She continues to have nausea, no vomiting.  The nausea is relieved with her home antiemetic.  She has periodic loose stools.  No abdominal pain except cramping prior to a bowel movement.  She feels weak.  Appetite is poor.  She is slowly losing weight.  Objective:  Vital signs in last 24 hours:  Blood pressure (!) 145/68, pulse 98, temperature 97.8 F (36.6 C), temperature source Tympanic, resp. rate 20, height 5' 5" (1.651 m), weight 131 lb 8 oz (59.6 kg), SpO2 100 %.   Resp: Lungs clear bilaterally. Cardio: Regular rate and rhythm. GI: Abdomen soft and nontender.  No hepatosplenomegaly.  No mass. Vascular: No leg edema.  Port-A-Cath without erythema.  Lab Results:  Lab Results  Component Value Date   WBC 3.3 (L) 08/11/2020   HGB 12.7 08/11/2020   HCT 37.3 08/11/2020   MCV 96.1 08/11/2020   PLT 170 08/11/2020   NEUTROABS 2.1 08/11/2020    Imaging:  No results found.  Medications: I have reviewed the patient's current medications.  Assessment/Plan: 1. Right liver mass-biopsy 07/10/2016 confirmed adenocarcinoma, PDL 1 0%, MSS, tumor mutation burden 3, IDH1 and PIK3 CA alterations  CT abdomen/pelvis 06/17/2016 and MRI abdomen 06/21/2016 confirmed an isolated right liver mass and a cystic pancreas lesion  PET scan 07/28/2016-the hepatic lesion is not hypermetabolic, hypermetabolic lesion in the gastric antrum suspicious for a primary neoplasm and no other evidence of metastatic disease  Upper endoscopy 08/03/2016-no mass found, biopsy from the gastric antrum-benign  CT-guided ablation of the right liver lesion 11/03/2016  MRI abdomen 03/08/2017-ablation defect in the right liver without residual/recurrent disease  MRI liver 06/11/2017-multiple new enhancing lesions in the right  hepatic lobe surrounding the ablation defect consistent with progressive cholangiocarcinoma  Y-90 radioembolization of the right liver 07/19/2017  MRI abdomen 10/09/2017-suspected progression of intrahepatic larger carcinoma within segment 6, reviewed by interventional radiology most consistent with pseudo-progression  MRI abdomen 01/08/2018-slight decrease in the segment 6 mass, no change in enhancement at the ablation bed, new tiny arterial phase area of hyperenhancement in the lateral left liver-potentially a vascular phenomena  MRI abdomen 04/03/2018-stable segment 6 mass, no change in 10 mm flash filling segment 3 lesion-potentially a vascular shunt, a porta hepatis node measures 2.2 cm compared to 1.5 cm, no new lymphadenopathy  MRI abdomen 07/03/2018 stable segment 6 liver mass, stable segment 3 arterial phase enhancing lesion stable porta hepatis adenopathy, stable pancreas cystic lesion  MRI abdomen 11/20/2018-new 2.1 cm lateral left liver lesion, new 8 mm medial left liver lesion, no change in posterior right hepatic mass, no change in necrotic hepato-duodenal ligament lymph node no change in cystic pancreas lesion  Y 90 of the left liver 01/16/2019  Cycle 1 gemcitabine/cisplatin 03/12/2019  Cycle 2 gemcitabine/cisplatin 04/09/2019 (doses reduced due to neutropenia)  Cycle 3 gemcitabine/cisplatin 05/07/2019  Cycle 4 gemcitabine/cisplatin 05/28/2019-Neulasta not given  Cycle 5 gemcitabine/cisplatin 06/18/2019  MRI abdomen 07/08/2019-progressive liver metastases  Ivosidenib 07/19/2019  MRI abdomen 10/27/2019-multiple hepatic lesions-grossly unchanged, stable to mildly improved porta hepatis node  Ivosidenib placed on hold 11/28/2019 due to QTc interval 488  Ivosidenib resumed 12/05/2019  MRI abdomen 03/16/2020-stable to improved liver lesions, no new lesions, stable portahepatis node, no evidence of disease progression  Ivosidenib continued  MRI liver 08/09/2020-slight enlargement  of multiple hepatic lesions, no new lesion, stable  portacaval lymph node, slight decrease of cystic pancreas lesion  Ivosidenib discontinued 08/11/2020 2. Left leg and right foot pain  Negative left leg Doppler 07/14/2016   3. Right abdominal pain-potentially related to the dominant right liver mass  4. Anorexia/weight loss  5. Benign appearing 16 mm cystic uncinatepancreas lesion noted on MRI 06/20/2016   Slightly larger on an MRI 03/08/2017, felt to most likely represent a side branch intraductal papillary mucinous neoplasm  Stable on MRI 10/09/2017  Stable on MRI 01/08/2018  Stable on MRI 04/03/2018  Stable on MRI 07/03/2018  Stable on MRI 11/20/2018  Stable on MRI 01/2019  Stable on MRI 03/17/2019  Slightly decreased on MRI 08/09/2020  6. Anemia-iron deficiency  7. Port-A-Cath placement interventional radiology 03/04/2019  8.Neutropenia secondary to chemotherapy-gemcitabineand cisplatindose reducedbeginning with cycle 2; mild neutropenia and thrombocytopenia 04/22/2019. Gemcitabine and cisplatin held. Treatment changed to every 3 weeks beginning on 05/14/2019.    Disposition: Laura [name] performance status is slowly declining.  She has enrolled in the palliative care program.  She plans to discuss enrolling in the hospice program when the palliative care representative comes to her home later this month.  We again discussed CODE STATUS.  She indicates she has decided on DNR status.  She will return for a Port-A-Cath flush and follow-up visit in 6 weeks.  We are available to see her sooner if needed.  Plan discussed with Dr. Benay Spice.    Ned Card ANP/GNP-BC   02/28/2021  9:53 AM

## 2021-02-28 NOTE — Patient Instructions (Signed)

## 2021-03-02 ENCOUNTER — Telehealth: Payer: Self-pay

## 2021-03-02 NOTE — Telephone Encounter (Signed)
-----   Message from Owens Shark, NP sent at 02/28/2021 10:04 AM EST ----- She is enrolled in the palliative care program.  She needs a new glucometer and was wondering if palliative care would provide this for her.  Please call palliative care for this request.  Thanks

## 2021-03-02 NOTE — Telephone Encounter (Signed)
Request for glucometer faxed to Dr. Loren Racer office to be completed

## 2021-03-03 DIAGNOSIS — Z1331 Encounter for screening for depression: Secondary | ICD-10-CM | POA: Diagnosis not present

## 2021-03-03 DIAGNOSIS — Z0000 Encounter for general adult medical examination without abnormal findings: Secondary | ICD-10-CM | POA: Diagnosis not present

## 2021-03-03 DIAGNOSIS — I129 Hypertensive chronic kidney disease with stage 1 through stage 4 chronic kidney disease, or unspecified chronic kidney disease: Secondary | ICD-10-CM | POA: Diagnosis not present

## 2021-03-03 DIAGNOSIS — D692 Other nonthrombocytopenic purpura: Secondary | ICD-10-CM | POA: Diagnosis not present

## 2021-03-03 DIAGNOSIS — R809 Proteinuria, unspecified: Secondary | ICD-10-CM | POA: Diagnosis not present

## 2021-03-03 DIAGNOSIS — K589 Irritable bowel syndrome without diarrhea: Secondary | ICD-10-CM | POA: Diagnosis not present

## 2021-03-03 DIAGNOSIS — I517 Cardiomegaly: Secondary | ICD-10-CM | POA: Diagnosis not present

## 2021-03-03 DIAGNOSIS — Z1339 Encounter for screening examination for other mental health and behavioral disorders: Secondary | ICD-10-CM | POA: Diagnosis not present

## 2021-03-03 DIAGNOSIS — R7302 Impaired glucose tolerance (oral): Secondary | ICD-10-CM | POA: Diagnosis not present

## 2021-03-03 DIAGNOSIS — R82998 Other abnormal findings in urine: Secondary | ICD-10-CM | POA: Diagnosis not present

## 2021-03-03 DIAGNOSIS — J302 Other seasonal allergic rhinitis: Secondary | ICD-10-CM | POA: Diagnosis not present

## 2021-03-03 DIAGNOSIS — C221 Intrahepatic bile duct carcinoma: Secondary | ICD-10-CM | POA: Diagnosis not present

## 2021-03-03 DIAGNOSIS — E7800 Pure hypercholesterolemia, unspecified: Secondary | ICD-10-CM | POA: Diagnosis not present

## 2021-03-03 DIAGNOSIS — I10 Essential (primary) hypertension: Secondary | ICD-10-CM | POA: Diagnosis not present

## 2021-03-03 DIAGNOSIS — N182 Chronic kidney disease, stage 2 (mild): Secondary | ICD-10-CM | POA: Diagnosis not present

## 2021-03-15 ENCOUNTER — Other Ambulatory Visit: Payer: PPO | Admitting: Nurse Practitioner

## 2021-03-15 ENCOUNTER — Other Ambulatory Visit: Payer: Self-pay

## 2021-03-15 DIAGNOSIS — Z515 Encounter for palliative care: Secondary | ICD-10-CM | POA: Diagnosis not present

## 2021-03-15 DIAGNOSIS — C801 Malignant (primary) neoplasm, unspecified: Secondary | ICD-10-CM

## 2021-03-15 NOTE — Progress Notes (Signed)
[facility] Consult Note Telephone: [phone]  Fax: [phone]  PATIENT NAME: Laura [name] [address] [place] [address] [phone] (home)  DOB: 05-27-1941 MRN: [id number]  PRIMARY CARE PROVIDER:    Haywood Pao, MD,  [place] [address] [phone]  REFERRING PROVIDER:   Haywood Pao, [place],  [place] [address] [phone]  RESPONSIBLE PARTY:   Extended Emergency Contact Information Primary Emergency Contact: Laura, [name] [address] [place] Phone: [phone] Relation: Daughter Secondary Emergency Contact: Laura [name], [name] [address] [name] of [place] Phone: [phone] Relation: Daughter  I met face to face with patient in home.  ASSESSMENT AND RECOMMENDATIONS:   Advance Care Planning: Goal of care: Patient's goal of care is comfort while preserving function. Directives: Signed DNR and MOST form present in home, copy on [facility] EMR. Patient desires Hospice care. Details of MOST form include, limited additional intervention, antibiotics if indicated, IV fluids if indicated, No feeding tube.  Symptom Management:  Weight loss: weight loss related to poor oral intake due to nausea, patient report some symptoms relief with Compazine 79m by mouth every 6hrs as needed. Denied fever, chills, or increased SOB. Continue current medication regimen. May consider starting Zofran 460mevery 6hrs as needed, patient has Prescription for Zofran but not taking it at this time. Pain: Denied any pain at this time. Intake: Takes Ensure one can once a day, was taking 2 cans, report she gets more nauseous with 2 cans. Continue offering foods from a variety food groups, avoiding fatty foods.  Cholangiocarcinoma mets to liver: patient verbalized interest in Hospice care at this time. Her PCP agreed  with patient receiving services. Patient deemed eligible for services.  Family /Caregiver/Community Supports: Patient lives at home independently. Has children that lives in town.  Cognitive / Functional decline: Patient awake and alert, coherent, she is however forgetful. Completes her ADLs independently requires ample time, now walks with a cane outside home due to gait instability, still drives a car. No report of recent falls.  I spent 60 minutes providing this consultation, time includes time spent with patient, chart review, provider coordination, and documentation. More than 50% of the time in this consultation was spent counseling and coordinating communication.   CHIEF COMPLAINT: Weight loss  History obtained from review of EMR, discussion with primary team, and interview with patient. Records reviewed and summarized bellow.  HISTORY OF PRESENT ILLNESS:  [name] a [age].o. year old female with multiple medical problems including cholangiocarcinoma mets to liver (Ivosidenib was discontinued due to QTc interval elongation), HTN, IBS, GERD. Patient not currently not treatment for her cancer, patient report opting for just observation at this time with no intention of restarting treatment. Patient is requesting for Hospice services as she feels she is getting weaker.  Palliative Care was asked to follow this patient to help address advance care planning and goals of care. This is a follow up visit.  CODE STATUS: DNR  PPS: 70% weak  HOSPICE ELIGIBILITY/DIAGNOSIS: Yes/ Metastatic cancer , patient not treatment  ROS Constitutional:  denied fever, denies chills, endorsed fatigue EYES: denies vision changes ENMT: denies dysphagia Cardiovascular: denies chest pain, denies palpitation Pulmonary: denies  cough, denies increased SOB Abdomen: endorses poor appetite, denies constipation, denies uncontrolled diarrhea, denies incontinence of  bowel GU: denies dysuria, denies  incontinence of urine MSK:  endorses ROM limitations, no falls reported Skin: denies rashes or wounds Neurological: endorses weakness, denies pain, denies insomnia Psych: Endorses positive mood Heme/lymph/immuno: denies bruises, abnormal bleeding   Physical Exam: Current and past weights: 130.7lbs down from 140lbs at last visit, Ht 65f5", BMI 21.7kg/m2 General: frail appearing, thin, sitting on couch in NAD EYES: anicteric sclera, lids intact, no discharge  ENMT: intact hearing, oral mucous membranes moist CV:  no LE edema Pulmonary: no increased work of breathing, no cough, no audible wheezes, room air Abdomen:  no ascites GU: deferred MSK: no contractures of LE, ambulatory Skin: warm and dry, no rashes or wounds on visible skin Neuro: Generalized weakness, A and O x 3 Psych: non-anxious affect today Hem/lymph/immuno: no widespread bruising   PAST MEDICAL HISTORY:  Past Medical History:  Diagnosis Date  . Cancer ([facility]   . GERD (gastroesophageal reflux disease)   . Heart murmur   . High cholesterol   . Hypertension   . IBS (irritable bowel syndrome)   . PONV (postoperative nausea and vomiting)   . Pre-diabetes     SOCIAL HX:  Social History   Tobacco Use  . Smoking status: Never Smoker  . Smokeless tobacco: Never Used  Substance Use Topics  . Alcohol use: No   FAMILY HX:  Family History  Problem Relation Age of Onset  . Alzheimer's disease Mother   . Alzheimer's disease Brother   . Alzheimer's disease Sister   . Breast cancer Sister   . CAD Brother 713      CABG  . Cancer Sister        Breast  . Cancer Brother        Lung    ALLERGIES:  Allergies  Allergen Reactions  . Pneumococcal Vaccines Swelling, Palpitations and Other (See Comments)    Fever  . Codeine Other (See Comments)    Looses balance and feels light headed     PERTINENT MEDICATIONS:  Outpatient Encounter Medications as of 03/15/2021  Medication Sig  . acetaminophen (TYLENOL) 500 MG  tablet Take 1,000 mg by mouth every 6 (six) hours as needed for mild pain.  .Marland KitchenamLODipine (NORVASC) 5 MG tablet Take 5 mg by mouth daily.   .Marland Kitchenaspirin EC 81 MG tablet Take 81 mg by mouth daily.  . Cholecalciferol (VITAMIN D3) 5000 units CAPS Take 5,000 Units by mouth daily.  . clonazePAM (KLONOPIN) 0.5 MG tablet Take 1.5 tablets (0.75 mg total) by mouth 2 (two) times daily as needed for anxiety. and 0.513m at bedtime for sleep as needed  . cyanocobalamin 1000 MCG tablet Take 1,000 mcg by mouth daily.  . Marland Kitchenocusate sodium (COLACE) 50 MG capsule Take 50 mg by mouth daily as needed for mild constipation.  (Patient not taking: Reported on 06/07/2020)  . doxazosin (CARDURA) 1 MG tablet Take 1 mg by mouth at bedtime.   . ferrous sulfate 325 (65 FE) MG tablet Take 650 mg by mouth 2 (two) times daily with a meal.   . Lancets (ONETOUCH DELICA PLUS [id number]   . lidocaine-prilocaine (EMLA) cream Apply 1 application topically as needed.  . Marland Kitchenmeprazole (PRILOSEC) 20 MG capsule Take 20 mg by mouth 2 (two) times daily.  . ondansetron (ZOFRAN) 4 MG tablet Take 1 tablet (4 mg total) by mouth every 6 (six) hours as needed. for nausea (Patient not taking: Reported on 01/17/2021)  . ONETOUCH VERIO test strip USE TO TEST BLOOD  SUGAR TWICE DAILY  . polyethylene glycol (MIRALAX / GLYCOLAX) packet Take 17 g by mouth daily as needed for mild constipation. (Patient not taking: No sig reported)  . potassium chloride SA (KLOR-CON) 20 MEQ tablet TAKE 1  BY MOUTH ONCE DAILY  . pravastatin (PRAVACHOL) 40 MG tablet Take 40 mg by mouth every evening.   . prochlorperazine (COMPAZINE) 5 MG tablet TAKE 1 TO 2 TABLETS BY MOUTH EVERY 6 HOURS AS NEEDED FOR NAUSEA OR VOMITING  . tetrahydrozoline 0.05 % ophthalmic solution Place 1 drop into both eyes daily as needed (dry eye).    No facility-administered encounter medications on file as of 03/15/2021.    Thank you for the opportunity to participate in the care of Ms Tami Blass.  The palliative care team will continue to follow. Please call our office at [phone] if we can be of additional assistance.  Jari Favre, DNP, AGPCNP-BC

## 2021-03-17 ENCOUNTER — Telehealth: Payer: Self-pay | Admitting: *Deleted

## 2021-03-17 DIAGNOSIS — C221 Intrahepatic bile duct carcinoma: Secondary | ICD-10-CM

## 2021-03-17 MED ORDER — PROCHLORPERAZINE MALEATE 5 MG PO TABS: ORAL_TABLET | ORAL | 0 refills | Status: DC

## 2021-03-17 NOTE — Telephone Encounter (Signed)
Per Dr. Benay Spice: OK to refill compazine and d/c K+ and omeprazole. Final decision per Dr. Osborne Casco. We are here for her if she ever needs [place].  Left this on voice mail and informed her that compazine was refilled w/note for future refills per Dr. Osborne Casco.

## 2021-03-17 NOTE — Telephone Encounter (Signed)
Call from Hospice RN, Cristela Blue that patient has been admitted to Hospice with Dr. Osborne Casco her attending. Patient plans to have no more future in person visits with Dr. Benay Spice due to transportation issues. Has following questions: 1. Need refill on Compazine #5 mg for 30 day supply (#120)--will Dr. Benay Spice refill or defer to Dr. Osborne Casco? 2. Patient asking if she needs to continue the omeprazole 20 mg bid (not sure she needs it) and the K+ (last K+ level was 4.8 and she is not on diuretic)?

## 2021-03-18 ENCOUNTER — Encounter: Payer: Self-pay | Admitting: *Deleted

## 2021-03-31 ENCOUNTER — Other Ambulatory Visit: Payer: Self-pay | Admitting: Nurse Practitioner

## 2021-03-31 ENCOUNTER — Telehealth: Payer: Self-pay

## 2021-03-31 DIAGNOSIS — C221 Intrahepatic bile duct carcinoma: Secondary | ICD-10-CM

## 2021-03-31 MED ORDER — CLONAZEPAM 0.5 MG PO TABS: 0.7500 mg | ORAL_TABLET | Freq: Two times a day (BID) | ORAL | 0 refills | Status: DC | PRN

## 2021-03-31 MED ORDER — PROCHLORPERAZINE MALEATE 5 MG PO TABS: ORAL_TABLET | ORAL | 0 refills | Status: DC

## 2021-03-31 NOTE — Telephone Encounter (Signed)
[facility] Nurse called on 03/30/21 for prescription refills Klonopin 0.5 120 tabs and compazine 5 mg tabs 120 tabs. Medication called in to Pt' s new pharmacy [facility] at [place].

## 2021-04-01 DIAGNOSIS — H04123 Dry eye syndrome of bilateral lacrimal glands: Secondary | ICD-10-CM | POA: Diagnosis not present

## 2021-04-01 DIAGNOSIS — H2513 Age-related nuclear cataract, bilateral: Secondary | ICD-10-CM | POA: Diagnosis not present

## 2021-04-11 ENCOUNTER — Ambulatory Visit: Payer: PPO | Admitting: Oncology

## 2021-06-14 DIAGNOSIS — K589 Irritable bowel syndrome without diarrhea: Secondary | ICD-10-CM | POA: Diagnosis not present

## 2021-06-14 DIAGNOSIS — Z6821 Body mass index (BMI) 21.0-21.9, adult: Secondary | ICD-10-CM | POA: Diagnosis not present

## 2021-06-14 DIAGNOSIS — J302 Other seasonal allergic rhinitis: Secondary | ICD-10-CM | POA: Diagnosis not present

## 2021-06-14 DIAGNOSIS — R634 Abnormal weight loss: Secondary | ICD-10-CM | POA: Diagnosis not present

## 2021-06-14 DIAGNOSIS — E785 Hyperlipidemia, unspecified: Secondary | ICD-10-CM | POA: Diagnosis not present

## 2021-06-14 DIAGNOSIS — C787 Secondary malignant neoplasm of liver and intrahepatic bile duct: Secondary | ICD-10-CM | POA: Diagnosis not present

## 2021-06-14 DIAGNOSIS — C221 Intrahepatic bile duct carcinoma: Secondary | ICD-10-CM | POA: Diagnosis not present

## 2021-06-14 DIAGNOSIS — K219 Gastro-esophageal reflux disease without esophagitis: Secondary | ICD-10-CM | POA: Diagnosis not present

## 2021-06-14 DIAGNOSIS — I10 Essential (primary) hypertension: Secondary | ICD-10-CM | POA: Diagnosis not present

## 2021-06-14 DIAGNOSIS — I517 Cardiomegaly: Secondary | ICD-10-CM | POA: Diagnosis not present

## 2021-07-25 DEATH — deceased

## 2023-11-15 NOTE — Telephone Encounter (Signed)
Telephone call
